# Patient Record
Sex: Male | Born: 1945
Health system: Southern US, Community
[De-identification: ages and names within clinical notes are randomized; demographics above are authoritative.]

## PROBLEM LIST (undated history)

## (undated) DIAGNOSIS — I10 Essential (primary) hypertension: Secondary | ICD-10-CM

## (undated) DIAGNOSIS — J302 Other seasonal allergic rhinitis: Secondary | ICD-10-CM

## (undated) DIAGNOSIS — I2510 Atherosclerotic heart disease of native coronary artery without angina pectoris: Secondary | ICD-10-CM

## (undated) DIAGNOSIS — K759 Inflammatory liver disease, unspecified: Secondary | ICD-10-CM

## (undated) DIAGNOSIS — E669 Obesity, unspecified: Secondary | ICD-10-CM

## (undated) DIAGNOSIS — Z9289 Personal history of other medical treatment: Secondary | ICD-10-CM

## (undated) DIAGNOSIS — R040 Epistaxis: Secondary | ICD-10-CM

## (undated) DIAGNOSIS — Z8669 Personal history of other diseases of the nervous system and sense organs: Secondary | ICD-10-CM

## (undated) DIAGNOSIS — G0490 Encephalitis and encephalomyelitis, unspecified: Secondary | ICD-10-CM

## (undated) DIAGNOSIS — Z950 Presence of cardiac pacemaker: Secondary | ICD-10-CM

## (undated) DIAGNOSIS — G4733 Obstructive sleep apnea (adult) (pediatric): Principal | ICD-10-CM

## (undated) DIAGNOSIS — I495 Sick sinus syndrome: Secondary | ICD-10-CM

## (undated) DIAGNOSIS — I639 Cerebral infarction, unspecified: Secondary | ICD-10-CM

## (undated) HISTORY — PX: APPENDECTOMY: SHX54

## (undated) HISTORY — PX: FRACTURE SURGERY: SHX138

## (undated) HISTORY — DX: Personal history of other medical treatment: Z92.89

## (undated) HISTORY — DX: Obesity, unspecified: E66.9

## (undated) HISTORY — DX: Sick sinus syndrome: I49.5

## (undated) HISTORY — DX: Cerebral infarction, unspecified: I63.9

## (undated) HISTORY — PX: CATARACT EXTRACTION W/ INTRAOCULAR LENS  IMPLANT, BILATERAL: SHX1307

## (undated) HISTORY — PX: ELBOW SURGERY: SHX618

## (undated) HISTORY — PX: WRIST FRACTURE SURGERY: SHX121

## (undated) HISTORY — DX: Essential (primary) hypertension: I10

## (undated) HISTORY — DX: Obstructive sleep apnea (adult) (pediatric): G47.33

## (undated) HISTORY — DX: Atherosclerotic heart disease of native coronary artery without angina pectoris: I25.10

## (undated) HISTORY — DX: Encephalitis and encephalomyelitis, unspecified: G04.90

---

## 1985-02-18 DIAGNOSIS — G0490 Encephalitis and encephalomyelitis, unspecified: Secondary | ICD-10-CM

## 1985-02-18 HISTORY — DX: Encephalitis and encephalomyelitis, unspecified: G04.90

## 2000-05-17 ENCOUNTER — Ambulatory Visit (HOSPITAL_COMMUNITY): Admission: RE | Admit: 2000-05-17 | Discharge: 2000-05-17 | Payer: Self-pay | Admitting: Family Medicine

## 2000-05-17 ENCOUNTER — Encounter: Payer: Self-pay | Admitting: Family Medicine

## 2010-08-20 HISTORY — PX: INGUINAL HERNIA REPAIR: SUR1180

## 2010-09-06 ENCOUNTER — Other Ambulatory Visit (HOSPITAL_COMMUNITY): Payer: BC Managed Care – PPO

## 2010-09-09 ENCOUNTER — Other Ambulatory Visit (INDEPENDENT_AMBULATORY_CARE_PROVIDER_SITE_OTHER): Payer: Self-pay | Admitting: Surgery

## 2010-09-09 ENCOUNTER — Ambulatory Visit (HOSPITAL_COMMUNITY)
Admission: RE | Admit: 2010-09-09 | Discharge: 2010-09-09 | Disposition: A | Discharge status: Home / Self Care | Payer: BC Managed Care – PPO | Source: Ambulatory Visit | Attending: Surgery | Admitting: Surgery

## 2010-09-09 ENCOUNTER — Encounter (HOSPITAL_COMMUNITY): Payer: BC Managed Care – PPO

## 2010-09-09 DIAGNOSIS — K4090 Unilateral inguinal hernia, without obstruction or gangrene, not specified as recurrent: Secondary | ICD-10-CM | POA: Insufficient documentation

## 2010-09-09 DIAGNOSIS — R9431 Abnormal electrocardiogram [ECG] [EKG]: Secondary | ICD-10-CM | POA: Insufficient documentation

## 2010-09-09 DIAGNOSIS — Z01812 Encounter for preprocedural laboratory examination: Secondary | ICD-10-CM | POA: Insufficient documentation

## 2010-09-09 DIAGNOSIS — Z01810 Encounter for preprocedural cardiovascular examination: Secondary | ICD-10-CM | POA: Insufficient documentation

## 2010-09-09 DIAGNOSIS — I10 Essential (primary) hypertension: Secondary | ICD-10-CM | POA: Insufficient documentation

## 2010-09-09 LAB — BASIC METABOLIC PANEL
BUN: 11 mg/dL (ref 6–23)
CO2: 28 mEq/L (ref 19–32)
Calcium: 8.8 mg/dL (ref 8.4–10.5)
Chloride: 95 mEq/L — ABNORMAL LOW (ref 96–112)
Creatinine, Ser: 0.64 mg/dL (ref 0.50–1.35)
GFR calc Af Amer: >60 mL/min (ref >60)
GFR calc non Af Amer: >60 mL/min (ref >60)
Glucose, Bld: 85 mg/dL (ref 70–99)
Potassium: 4.3 mEq/L (ref 3.5–5.1)
Sodium: 131 mEq/L — ABNORMAL LOW (ref 135–145)

## 2010-09-09 LAB — CBC
HCT: 40.5 % (ref 39.0–52.0)
Hemoglobin: 15 g/dL (ref 13.0–17.0)
MCH: 31.4 pg (ref 26.0–34.0)
MCHC: 37 g/dL — ABNORMAL HIGH (ref 30.0–36.0)
MCV: 84.9 fL (ref 78.0–100.0)
Platelets: 120 10*3/uL — ABNORMAL LOW (ref 150–400)
RBC: 4.77 MIL/uL (ref 4.22–5.81)
RDW: 11.7 % (ref 11.5–15.5)
WBC: 5.6 10*3/uL (ref 4.0–10.5)

## 2010-09-09 LAB — SURGICAL PCR SCREEN
MRSA, PCR: NEGATIVE
Staphylococcus aureus: POSITIVE — AB

## 2010-09-17 ENCOUNTER — Ambulatory Visit (HOSPITAL_COMMUNITY)
Admission: RE | Admit: 2010-09-17 | Discharge: 2010-09-17 | Disposition: A | Discharge status: Home / Self Care | Payer: BC Managed Care – PPO | Source: Ambulatory Visit | Attending: Surgery | Admitting: Surgery

## 2010-09-17 DIAGNOSIS — Z01810 Encounter for preprocedural cardiovascular examination: Secondary | ICD-10-CM | POA: Insufficient documentation

## 2010-09-17 DIAGNOSIS — K4090 Unilateral inguinal hernia, without obstruction or gangrene, not specified as recurrent: Secondary | ICD-10-CM

## 2010-09-17 DIAGNOSIS — Z01812 Encounter for preprocedural laboratory examination: Secondary | ICD-10-CM | POA: Insufficient documentation

## 2010-09-17 DIAGNOSIS — Z01818 Encounter for other preprocedural examination: Secondary | ICD-10-CM | POA: Insufficient documentation

## 2010-09-17 DIAGNOSIS — I498 Other specified cardiac arrhythmias: Secondary | ICD-10-CM | POA: Insufficient documentation

## 2010-09-17 DIAGNOSIS — I10 Essential (primary) hypertension: Secondary | ICD-10-CM | POA: Insufficient documentation

## 2010-09-21 NOTE — Op Note (Signed)
  Aaron Keller, Aaron Keller NO.:  [id number]  MEDICAL RECORD NO.:  [id number]  LOCATION:  DAYL                         FACILITY:  [facility]  PHYSICIAN:  Wilmon Arms. Corliss Skains, M.D. DATE OF BIRTH:  1945-05-04  DATE OF PROCEDURE: DATE OF DISCHARGE:                              OPERATIVE REPORT   PREOPERATIVE DIAGNOSIS:  Right inguinal hernia.  POSTOPERATIVE DIAGNOSIS:  Right inguinal hernia.  PROCEDURE:  Right inguinal hernia repair with mesh.  SURGEON:  Wilmon Arms. Satsuki Zillmer, MD  ANESTHESIA:  General.  INDICATIONS:  This is a 65 year old male who presents with a 81-month history of a palpable mass in his right groin.  This is reducible.  We diagnosed him with a right inguinal hernia, recommended repair.  DESCRIPTION OF PROCEDURE:  The patient was brought to the operating room, placed in the supine position on operating room table.  After an adequate level of general anesthesia was obtained, the patient's right groin was shaved, prepped with ChloraPrep and Betadine and draped in sterile fashion.  Time-out was taken to identify proper patient, proper procedure.  We made an oblique incision above the right inguinal ligament.  Dissection was carried down through considerable amount of adipose tissue to the fascia.  We had to use 2 Weitlaner retractors for exposure.  We saw a very large hernia bulge protruding from the external ring.  We opened the external oblique fascia along direction of its fibers down the external ring.  A small bleeding vessel was ligated with 3-0 silk sutures.  We reduced the large direct hernia sac back into the preperitoneal space.  We mobilized this thoroughly and then were able to close the floor of the inguinal canal with a 0 Vicryl.  We skeletonized the spermatic cord and reduced a small indirect hernia back through the internal ring.  The internal ring was tightened with a 3-0 Vicryl; 3 x 6 Ultrapro mesh was cut in a keyhole shape and then was  secured with 2-0 Prolene, beginning at the pubic tubercle.  We ran this along the shelving edge inferiorly and the internal oblique fascia superiorly. The tails of the mesh were sutured together behind the spermatic cord. The mesh was tucked underneath the external oblique fascia laterally. The fascia was reapproximated with 2-0 Vicryl.  We infiltrated the subcutaneous tissues and fascia with 20 mL of Exparel; 3-0 Vicryl was used to close subcutaneous tissues and 4-0 Monocryl was used to close the skin.  Steri-Strips and clean dressings were applied.  The patient was extubated, brought to recovery in stable condition.  All sponge, instrument, needle counts were correct.     Wilmon Arms. Corliss Skains, M.D.     MKT/MEDQ  D:  09/17/2010  T:  09/17/2010  Job:  [id number]  Electronically Signed by Manus Rudd M.D. on 09/21/2010 01:04:32 PM

## 2010-10-04 ENCOUNTER — Encounter (INDEPENDENT_AMBULATORY_CARE_PROVIDER_SITE_OTHER): Payer: Self-pay | Admitting: Surgery

## 2010-10-05 ENCOUNTER — Encounter (INDEPENDENT_AMBULATORY_CARE_PROVIDER_SITE_OTHER): Payer: Self-pay | Admitting: Surgery

## 2010-10-05 ENCOUNTER — Ambulatory Visit (INDEPENDENT_AMBULATORY_CARE_PROVIDER_SITE_OTHER): Payer: BC Managed Care – PPO | Admitting: Surgery

## 2010-10-05 DIAGNOSIS — Z8719 Personal history of other diseases of the digestive system: Secondary | ICD-10-CM | POA: Insufficient documentation

## 2010-10-05 DIAGNOSIS — Z9889 Other specified postprocedural states: Secondary | ICD-10-CM

## 2010-10-05 NOTE — Progress Notes (Signed)
This patient underwent a right inguinal hernia repair with mesh on June 29 for a large direct inguinal hernia. He had a very tiny indirect hernia as well. His only complaint today is some skin sensitivity where the hair is starting to grow back. Appetite and bowel movements are normal. He has been limiting his activity.  His incision seems to be healing well with no sign of infection. No scrotal or testicular swelling. He has a firm healing ridge under his incision as expected. He still has a little bit of numbness in the skin below his incision.  Impression: Status post right inguinal hernia repair with mesh for large direct inguinal hernia patient is doing as well as could be expected.  Plan: The patient may slowly begin increasing his level of activity over the next 2 weeks. He may resume full activity at that time. Followup on a p.r.n. basis.

## 2010-10-08 ENCOUNTER — Encounter (INDEPENDENT_AMBULATORY_CARE_PROVIDER_SITE_OTHER): Payer: BC Managed Care – PPO | Admitting: Surgery

## 2010-10-21 ENCOUNTER — Telehealth (INDEPENDENT_AMBULATORY_CARE_PROVIDER_SITE_OTHER): Payer: Self-pay

## 2010-10-21 ENCOUNTER — Encounter (INDEPENDENT_AMBULATORY_CARE_PROVIDER_SITE_OTHER): Payer: Self-pay | Admitting: Surgery

## 2010-10-21 NOTE — Telephone Encounter (Signed)
Pt's wife called to report that his incision has been bleeding/oozing.  No redness at incision site, but the pt did lift 2 50lb bags of salt earlier this week.  Pt has appt 8/3 with Dr. Corliss Skains.

## 2010-10-22 ENCOUNTER — Encounter (INDEPENDENT_AMBULATORY_CARE_PROVIDER_SITE_OTHER): Payer: Self-pay | Admitting: Surgery

## 2010-10-22 ENCOUNTER — Ambulatory Visit (INDEPENDENT_AMBULATORY_CARE_PROVIDER_SITE_OTHER): Payer: BC Managed Care – PPO | Admitting: Surgery

## 2010-10-22 VITALS — Temp 97.9°F

## 2010-10-22 DIAGNOSIS — Z9889 Other specified postprocedural states: Secondary | ICD-10-CM

## 2010-10-22 DIAGNOSIS — Z8719 Personal history of other diseases of the digestive system: Secondary | ICD-10-CM

## 2010-10-22 NOTE — Progress Notes (Signed)
The patient called yesterday with concerns about a little bit of drainage coming from the medial end of his incision. He is 5 weeks out from a right inguinal hernia repair with mesh. Last week he lifted a 40 pound bag of water softener salts. He noticed a little bit of drainage from the medial end of this incision and then some dark bloody looking drainage. It drainage since has stopped. He denies any pain in his groin. The swelling seems to have decreased.  On physical examination his incision is well healed with no openings. No drainage noted. No sign of recurrent hernia. The firm scar tissue seems to be less than last visit.  Impression: Status post right inguinal herniorrhaphy with mesh. No sign of wound infection or hematoma. Recommendations: He may resume full activity. Followup p.r.n.

## 2011-10-05 DIAGNOSIS — H26499 Other secondary cataract, unspecified eye: Secondary | ICD-10-CM | POA: Insufficient documentation

## 2013-09-18 DIAGNOSIS — Z961 Presence of intraocular lens: Secondary | ICD-10-CM | POA: Insufficient documentation

## 2016-03-17 ENCOUNTER — Telehealth: Payer: Self-pay

## 2016-03-17 NOTE — Telephone Encounter (Signed)
SENT NOTES TO SCHEDULING 

## 2016-04-01 ENCOUNTER — Encounter (HOSPITAL_COMMUNITY): Payer: Self-pay

## 2016-04-01 ENCOUNTER — Emergency Department (HOSPITAL_COMMUNITY): Payer: BLUE CROSS/BLUE SHIELD

## 2016-04-01 ENCOUNTER — Encounter (HOSPITAL_COMMUNITY)
Admission: EM | Disposition: A | Discharge status: Home / Self Care | Payer: Self-pay | Source: Home / Self Care | Attending: Cardiovascular Disease

## 2016-04-01 ENCOUNTER — Inpatient Hospital Stay (HOSPITAL_COMMUNITY)
Admission: EM | Admit: 2016-04-01 | Discharge: 2016-04-04 | DRG: 287 | Disposition: A | Discharge status: Home / Self Care | Payer: BLUE CROSS/BLUE SHIELD | Attending: Cardiovascular Disease | Admitting: Cardiovascular Disease

## 2016-04-01 DIAGNOSIS — Z79899 Other long term (current) drug therapy: Secondary | ICD-10-CM | POA: Diagnosis not present

## 2016-04-01 DIAGNOSIS — N183 Chronic kidney disease, stage 3 (moderate): Secondary | ICD-10-CM | POA: Diagnosis present

## 2016-04-01 DIAGNOSIS — I442 Atrioventricular block, complete: Secondary | ICD-10-CM | POA: Diagnosis not present

## 2016-04-01 DIAGNOSIS — R001 Bradycardia, unspecified: Secondary | ICD-10-CM | POA: Diagnosis present

## 2016-04-01 DIAGNOSIS — Z8669 Personal history of other diseases of the nervous system and sense organs: Secondary | ICD-10-CM | POA: Diagnosis not present

## 2016-04-01 DIAGNOSIS — I10 Essential (primary) hypertension: Secondary | ICD-10-CM | POA: Diagnosis not present

## 2016-04-01 DIAGNOSIS — I25110 Atherosclerotic heart disease of native coronary artery with unstable angina pectoris: Secondary | ICD-10-CM | POA: Diagnosis present

## 2016-04-01 DIAGNOSIS — Z8673 Personal history of transient ischemic attack (TIA), and cerebral infarction without residual deficits: Secondary | ICD-10-CM

## 2016-04-01 DIAGNOSIS — Z8661 Personal history of infections of the central nervous system: Secondary | ICD-10-CM

## 2016-04-01 DIAGNOSIS — Z888 Allergy status to other drugs, medicaments and biological substances status: Secondary | ICD-10-CM

## 2016-04-01 DIAGNOSIS — I2510 Atherosclerotic heart disease of native coronary artery without angina pectoris: Secondary | ICD-10-CM | POA: Diagnosis not present

## 2016-04-01 DIAGNOSIS — I25700 Atherosclerosis of coronary artery bypass graft(s), unspecified, with unstable angina pectoris: Secondary | ICD-10-CM | POA: Diagnosis not present

## 2016-04-01 DIAGNOSIS — I129 Hypertensive chronic kidney disease with stage 1 through stage 4 chronic kidney disease, or unspecified chronic kidney disease: Secondary | ICD-10-CM | POA: Diagnosis present

## 2016-04-01 DIAGNOSIS — I200 Unstable angina: Secondary | ICD-10-CM | POA: Diagnosis present

## 2016-04-01 DIAGNOSIS — I25119 Atherosclerotic heart disease of native coronary artery with unspecified angina pectoris: Secondary | ICD-10-CM | POA: Diagnosis present

## 2016-04-01 HISTORY — PX: CARDIAC CATHETERIZATION: SHX172

## 2016-04-01 HISTORY — DX: Personal history of other diseases of the nervous system and sense organs: Z86.69

## 2016-04-01 HISTORY — DX: Essential (primary) hypertension: I10

## 2016-04-01 LAB — CBC WITH DIFFERENTIAL/PLATELET
BASOS ABS: 0 10*3/uL (ref 0.0–0.1)
BASOS PCT: 0 %
EOS PCT: 1 %
Eosinophils Absolute: 0.1 10*3/uL (ref 0.0–0.7)
HCT: 39.8 % (ref 39.0–52.0)
Hemoglobin: 14.1 g/dL (ref 13.0–17.0)
LYMPHS ABS: 1.9 10*3/uL (ref 0.7–4.0)
Lymphocytes Relative: 19 %
MCH: 30.9 pg (ref 26.0–34.0)
MCHC: 35.4 g/dL (ref 30.0–36.0)
MCV: 87.3 fL (ref 78.0–100.0)
Monocytes Absolute: 0.5 10*3/uL (ref 0.1–1.0)
Monocytes Relative: 5 %
NEUTROS PCT: 75 %
Neutro Abs: 7.3 10*3/uL (ref 1.7–7.7)
PLATELETS: 144 10*3/uL — AB (ref 150–400)
RBC: 4.56 MIL/uL (ref 4.22–5.81)
RDW: 12.2 % (ref 11.5–15.5)
WBC: 9.8 10*3/uL (ref 4.0–10.5)

## 2016-04-01 LAB — I-STAT CHEM 8, ED
BUN: 21 mg/dL — ABNORMAL HIGH (ref 6–20)
CREATININE: 1.4 mg/dL — AB (ref 0.61–1.24)
Calcium, Ion: 1.02 mmol/L — ABNORMAL LOW (ref 1.15–1.40)
Chloride: 95 mmol/L — ABNORMAL LOW (ref 101–111)
Glucose, Bld: 137 mg/dL — ABNORMAL HIGH (ref 65–99)
HEMATOCRIT: 40 % (ref 39.0–52.0)
HEMOGLOBIN: 13.6 g/dL (ref 13.0–17.0)
POTASSIUM: 4.7 mmol/L (ref 3.5–5.1)
SODIUM: 130 mmol/L — AB (ref 135–145)
TCO2: 25 mmol/L (ref 0–100)

## 2016-04-01 LAB — TROPONIN I
Troponin I: 0.03 ng/mL (ref <0.03)
Troponin I: 0.04 ng/mL (ref <0.03)

## 2016-04-01 LAB — COMPREHENSIVE METABOLIC PANEL
ALBUMIN: 4 g/dL (ref 3.5–5.0)
ALK PHOS: 33 U/L — AB (ref 38–126)
ALT: 18 U/L (ref 17–63)
AST: 25 U/L (ref 15–41)
Anion gap: 13 (ref 5–15)
BUN: 17 mg/dL (ref 6–20)
CHLORIDE: 96 mmol/L — AB (ref 101–111)
CO2: 22 mmol/L (ref 22–32)
Calcium: 8.6 mg/dL — ABNORMAL LOW (ref 8.9–10.3)
Creatinine, Ser: 1.47 mg/dL — ABNORMAL HIGH (ref 0.61–1.24)
GFR calc Af Amer: 54 mL/min — ABNORMAL LOW (ref >60)
GFR calc non Af Amer: 47 mL/min — ABNORMAL LOW (ref >60)
GLUCOSE: 140 mg/dL — AB (ref 65–99)
Potassium: 4.8 mmol/L (ref 3.5–5.1)
SODIUM: 131 mmol/L — AB (ref 135–145)
Total Bilirubin: 0.7 mg/dL (ref 0.3–1.2)
Total Protein: 6.5 g/dL (ref 6.5–8.1)

## 2016-04-01 LAB — I-STAT CG4 LACTIC ACID, ED: Lactic Acid, Venous: 2.17 mmol/L (ref 0.5–1.9)

## 2016-04-01 LAB — I-STAT TROPONIN, ED: Troponin i, poc: 0.01 ng/mL (ref 0.00–0.08)

## 2016-04-01 LAB — PROTIME-INR
INR: 0.99
Prothrombin Time: 13.1 seconds (ref 11.4–15.2)

## 2016-04-01 LAB — MAGNESIUM
Magnesium: 2.2 mg/dL (ref 1.7–2.4)
Magnesium: 2.1 mg/dL (ref 1.7–2.4)

## 2016-04-01 LAB — MRSA PCR SCREENING: MRSA by PCR: NEGATIVE

## 2016-04-01 LAB — TSH: TSH: 3.644 u[IU]/mL (ref 0.350–4.500)

## 2016-04-01 SURGERY — LEFT HEART CATH AND CORONARY ANGIOGRAPHY
Anesthesia: LOCAL

## 2016-04-01 MED ORDER — SODIUM CHLORIDE 0.9 % IV SOLN: 250.0000 mL | INTRAVENOUS | Status: DC | PRN

## 2016-04-01 MED ORDER — ONDANSETRON HCL 4 MG/2ML IJ SOLN: 4.0000 mg | Freq: Four times a day (QID) | INTRAMUSCULAR | Status: DC | PRN

## 2016-04-01 MED ORDER — ASPIRIN 81 MG PO CHEW
81.0000 mg | CHEWABLE_TABLET | ORAL | Status: DC
Start: 2016-04-02 — End: 2016-04-03

## 2016-04-01 MED ORDER — SIMVASTATIN 20 MG PO TABS: 20.0000 mg | ORAL_TABLET | Freq: Every day | ORAL | Status: DC

## 2016-04-01 MED ORDER — VERAPAMIL HCL 2.5 MG/ML IV SOLN
INTRAVENOUS | Status: AC
  Filled 2016-04-01: qty 2

## 2016-04-01 MED ORDER — SODIUM CHLORIDE 0.9% FLUSH
3.0000 mL | Freq: Two times a day (BID) | INTRAVENOUS | Status: DC
  Administered 2016-04-01 – 2016-04-04 (×5): 3 mL via INTRAVENOUS

## 2016-04-01 MED ORDER — CARBAMAZEPINE 200 MG PO TABS
200.0000 mg | ORAL_TABLET | Freq: Three times a day (TID) | ORAL | Status: DC
  Administered 2016-04-01 – 2016-04-04 (×10): 200 mg via ORAL
  Filled 2016-04-01 (×10): qty 1

## 2016-04-01 MED ORDER — HEPARIN SODIUM (PORCINE) 5000 UNIT/ML IJ SOLN
5000.0000 [IU] | Freq: Three times a day (TID) | INTRAMUSCULAR | Status: DC
  Administered 2016-04-01 – 2016-04-03 (×6): 5000 [IU] via SUBCUTANEOUS
  Filled 2016-04-01 (×6): qty 1

## 2016-04-01 MED ORDER — ASPIRIN 300 MG RE SUPP: 300.0000 mg | RECTAL | Status: DC

## 2016-04-01 MED ORDER — ASPIRIN-DIPYRIDAMOLE ER 25-200 MG PO CP12
1.0000 | ORAL_CAPSULE | Freq: Every day | ORAL | Status: DC
  Administered 2016-04-02 – 2016-04-04 (×3): 1 via ORAL
  Filled 2016-04-01 (×3): qty 1

## 2016-04-01 MED ORDER — AMLODIPINE BESYLATE 10 MG PO TABS
10.0000 mg | ORAL_TABLET | Freq: Every day | ORAL | Status: DC
  Administered 2016-04-02 – 2016-04-04 (×3): 10 mg via ORAL
  Filled 2016-04-01 (×3): qty 1

## 2016-04-01 MED ORDER — LIDOCAINE HCL (PF) 1 % IJ SOLN
INTRAMUSCULAR | Status: DC | PRN
  Administered 2016-04-01: 8 mL
  Administered 2016-04-01: 2 mL

## 2016-04-01 MED ORDER — IOPAMIDOL (ISOVUE-370) INJECTION 76%
INTRAVENOUS | Status: DC | PRN
  Administered 2016-04-01: 45 mL via INTRA_ARTERIAL

## 2016-04-01 MED ORDER — ATORVASTATIN CALCIUM 80 MG PO TABS
80.0000 mg | ORAL_TABLET | Freq: Every day | ORAL | Status: DC
  Administered 2016-04-01 – 2016-04-03 (×3): 80 mg via ORAL
  Filled 2016-04-01 (×3): qty 1

## 2016-04-01 MED ORDER — NITROGLYCERIN 0.4 MG SL SUBL: 0.4000 mg | SUBLINGUAL_TABLET | SUBLINGUAL | Status: DC | PRN

## 2016-04-01 MED ORDER — CITALOPRAM HYDROBROMIDE 20 MG PO TABS
40.0000 mg | ORAL_TABLET | Freq: Every day | ORAL | Status: DC
  Administered 2016-04-02 – 2016-04-04 (×3): 40 mg via ORAL
  Filled 2016-04-01 (×3): qty 2

## 2016-04-01 MED ORDER — SODIUM CHLORIDE 0.9 % IV BOLUS (SEPSIS)
1000.0000 mL | Freq: Once | INTRAVENOUS | Status: AC
  Administered 2016-04-01: 1000 mL via INTRAVENOUS

## 2016-04-01 MED ORDER — ASPIRIN 81 MG PO CHEW
324.0000 mg | CHEWABLE_TABLET | ORAL | Status: DC
  Filled 2016-04-01: qty 4

## 2016-04-01 MED ORDER — HEPARIN SODIUM (PORCINE) 1000 UNIT/ML IJ SOLN
INTRAMUSCULAR | Status: AC
  Filled 2016-04-01: qty 1

## 2016-04-01 MED ORDER — GABAPENTIN 800 MG PO TABS
400.0000 mg | ORAL_TABLET | Freq: Four times a day (QID) | ORAL | Status: DC
  Filled 2016-04-01: qty 0.5

## 2016-04-01 MED ORDER — LIDOCAINE HCL (PF) 1 % IJ SOLN
INTRAMUSCULAR | Status: AC
  Filled 2016-04-01: qty 30

## 2016-04-01 MED ORDER — SODIUM CHLORIDE 0.9 % IV SOLN: INTRAVENOUS | Status: DC

## 2016-04-01 MED ORDER — FENTANYL CITRATE (PF) 100 MCG/2ML IJ SOLN: 200.0000 ug | Freq: Once | INTRAMUSCULAR | Status: DC

## 2016-04-01 MED ORDER — SODIUM CHLORIDE 0.9 % WEIGHT BASED INFUSION: 1.0000 mL/kg/h | INTRAVENOUS | Status: AC

## 2016-04-01 MED ORDER — CLORAZEPATE DIPOTASSIUM 3.75 MG PO TABS
3.7500 mg | ORAL_TABLET | Freq: Two times a day (BID) | ORAL | Status: DC
  Administered 2016-04-01 – 2016-04-04 (×6): 3.75 mg via ORAL
  Filled 2016-04-01 (×6): qty 1

## 2016-04-01 MED ORDER — VERAPAMIL HCL 2.5 MG/ML IV SOLN
INTRAVENOUS | Status: DC | PRN
  Administered 2016-04-01: 10 mL via INTRA_ARTERIAL

## 2016-04-01 MED ORDER — SODIUM CHLORIDE 0.9 % IV SOLN
250.0000 mL | INTRAVENOUS | Status: DC | PRN
  Administered 2016-04-02: 250 mL via INTRAVENOUS

## 2016-04-01 MED ORDER — HEPARIN (PORCINE) IN NACL 2-0.9 UNIT/ML-% IJ SOLN
INTRAMUSCULAR | Status: AC
  Filled 2016-04-01: qty 1000

## 2016-04-01 MED ORDER — GABAPENTIN 400 MG PO CAPS
400.0000 mg | ORAL_CAPSULE | Freq: Four times a day (QID) | ORAL | Status: DC
  Administered 2016-04-01 – 2016-04-04 (×12): 400 mg via ORAL
  Filled 2016-04-01 (×13): qty 1

## 2016-04-01 MED ORDER — HEPARIN SODIUM (PORCINE) 1000 UNIT/ML IJ SOLN
INTRAMUSCULAR | Status: DC | PRN
  Administered 2016-04-01: 4500 [IU] via INTRAVENOUS

## 2016-04-01 MED ORDER — ALPRAZOLAM 0.25 MG PO TABS: 0.2500 mg | ORAL_TABLET | Freq: Two times a day (BID) | ORAL | Status: DC | PRN

## 2016-04-01 MED ORDER — IOPAMIDOL (ISOVUE-370) INJECTION 76%
INTRAVENOUS | Status: AC
  Filled 2016-04-01: qty 125

## 2016-04-01 MED ORDER — ATROPINE SULFATE 1 MG/10ML IJ SOSY
1.0000 mg | PREFILLED_SYRINGE | Freq: Once | INTRAMUSCULAR | Status: AC
  Administered 2016-04-01: 1 mg via INTRAVENOUS

## 2016-04-01 MED ORDER — METOPROLOL TARTRATE 25 MG PO TABS: 25.0000 mg | ORAL_TABLET | Freq: Two times a day (BID) | ORAL | Status: DC

## 2016-04-01 MED ORDER — HEPARIN (PORCINE) IN NACL 2-0.9 UNIT/ML-% IJ SOLN
INTRAMUSCULAR | Status: DC | PRN
  Administered 2016-04-01: 1000 mL

## 2016-04-01 MED ORDER — ACETAMINOPHEN 325 MG PO TABS: 650.0000 mg | ORAL_TABLET | ORAL | Status: DC | PRN

## 2016-04-01 MED ORDER — SODIUM CHLORIDE 0.9% FLUSH: 3.0000 mL | INTRAVENOUS | Status: DC | PRN

## 2016-04-01 MED ORDER — FOLIC ACID 1 MG PO TABS
1.0000 mg | ORAL_TABLET | Freq: Every day | ORAL | Status: DC
  Administered 2016-04-02 – 2016-04-04 (×3): 1 mg via ORAL
  Filled 2016-04-01 (×3): qty 1

## 2016-04-01 MED ORDER — ZOLPIDEM TARTRATE 5 MG PO TABS
5.0000 mg | ORAL_TABLET | Freq: Every evening | ORAL | Status: DC | PRN
  Filled 2016-04-01: qty 1

## 2016-04-01 SURGICAL SUPPLY — 16 items
CABLE ADAPT CONN TEMP 6FT (ADAPTER) ×2 IMPLANT
CATH EXPO 5F FL3.5 (CATHETERS) ×2 IMPLANT
CATH EXPO 5FR ANG PIGTAIL 145 (CATHETERS) ×2 IMPLANT
CATH INFINITI JR4 5F (CATHETERS) ×2 IMPLANT
CATH S G BIP PACING (SET/KITS/TRAYS/PACK) ×2 IMPLANT
DEVICE RAD COMP TR BAND LRG (VASCULAR PRODUCTS) ×2 IMPLANT
GLIDESHEATH SLEND SS 6F .021 (SHEATH) ×2 IMPLANT
GUIDEWIRE INQWIRE 1.5J.035X260 (WIRE) ×1 IMPLANT
INQWIRE 1.5J .035X260CM (WIRE) ×2
KIT HEART LEFT (KITS) ×2 IMPLANT
PACK CARDIAC CATHETERIZATION (CUSTOM PROCEDURE TRAY) ×2 IMPLANT
SHEATH PINNACLE 6F 10CM (SHEATH) ×2 IMPLANT
SLEEVE REPOSITIONING LENGTH 30 (MISCELLANEOUS) ×2 IMPLANT
SYR MEDRAD MARK V 150ML (SYRINGE) ×2 IMPLANT
TRANSDUCER W/STOPCOCK (MISCELLANEOUS) ×2 IMPLANT
TUBING CIL FLEX 10 FLL-RA (TUBING) ×2 IMPLANT

## 2016-04-01 NOTE — ED Notes (Signed)
Patient states he didn't really feel very good yest. C/o chest discomfort and sob. States he was afraid to go to sleep last pm. C/o nausea with ambulation. Skin w/d color pink . Wife at bedsid.e

## 2016-04-01 NOTE — ED Notes (Signed)
Dr. Regenia Skeeter at bedside patient still states he isn't having chest pain patient is alert oriented.

## 2016-04-01 NOTE — ED Notes (Signed)
Permit signed for cath lab.

## 2016-04-01 NOTE — ED Provider Notes (Signed)
[name] Provider Note   CSN: [id number] Arrival date & time: 04/01/16  [id number]     History   Chief Complaint Chief Complaint  Patient presents with  . Bradycardia    HPI Aaron Keller is a 71 y.o. male  past medical history of hypertension presenting today with chest pressure and dizziness and lightheadedness. Patient states he woke up with the symptoms. He also has shortness of breath. He denies having this severe of symptoms in the past. He has had bradycardia and has been documenting his vital signs recently. He has follow-up with cardiology to see Dr. Saunders Revel in 2 weeks. Upon arrival to the emergency department, his heart rate was found to be in the 30s and he was immediately taken back to acute room.  10 Systems reviewed and are negative for acute change except as noted in the HPI.   HPI  Past Medical History:  Diagnosis Date  . Allergy   . Chronic leg pain   . Encephalitis   . Hypertension   . Seizures ([place])   . Stroke [facility])     Patient Active Problem List   Diagnosis Date Noted  . S/P right inguinal hernia repair 10/05/2010    Past Surgical History:  Procedure Laterality Date  . APPENDECTOMY    . HERNIA REPAIR         Home Medications    Prior to Admission medications   Medication Sig Start Date End Date Taking? Authorizing Provider  carbamazepine (TEGRETOL) 200 MG tablet Take 200 mg by mouth 3 (three) times daily.      Historical Provider, MD  citalopram (CELEXA) 40 MG tablet Take 40 mg by mouth daily.      Historical Provider, MD  clorazepate (TRANXENE-T) 3.75 MG tablet Take 3.75 mg by mouth 2 (two) times daily.      Historical Provider, MD  folic acid (FOLVITE) 1 MG tablet Take 1 mg by mouth daily.      Historical Provider, MD  gabapentin (NEURONTIN) 400 MG tablet Take 400 mg by mouth 4 (four) times daily.      Historical Provider, MD  metoprolol tartrate (LOPRESSOR) 25 MG tablet Take 25 mg by mouth 2 (two) times daily.      Historical  Provider, MD  simvastatin (ZOCOR) 20 MG tablet Take 20 mg by mouth at bedtime.      Historical Provider, MD    Family History History reviewed. No pertinent family history.  Social History Social History  Substance Use Topics  . Smoking status: Never Smoker  . Smokeless tobacco: Never Used  . Alcohol use No     Allergies   Cyclosporine and Serzone [nefazodone hcl]   Review of Systems Review of Systems   Physical Exam Updated Vital Signs BP 110/57 (BP Location: Left Arm)   Pulse (!) 56   Temp 98.6 F (37 C) (Oral)   Resp 18   Ht 5\' 5"  (1.651 m)   Wt 188 lb (85.3 kg)   SpO2 100%   BMI 31.28 kg/m   Physical Exam  Constitutional: He is oriented to person, place, and time. Vital signs are normal. He appears well-developed and well-nourished.  Non-toxic appearance. He does not appear ill. He appears distressed.  HENT:  Head: Normocephalic and atraumatic.  Nose: Nose normal.  Mouth/Throat: Oropharynx is clear and moist. No oropharyngeal exudate.  Eyes: Conjunctivae and EOM are normal. Pupils are equal, round, and reactive to light. No scleral icterus.  Neck: Normal range of motion. Neck  supple. No tracheal deviation, no edema, no erythema and normal range of motion present. No thyroid mass and no thyromegaly present.  Cardiovascular: Regular rhythm, S1 normal, S2 normal, normal heart sounds, intact distal pulses and normal pulses.  Exam reveals no gallop and no friction rub.   No murmur heard. Bradycardic  Pulmonary/Chest: Effort normal and breath sounds normal. No respiratory distress. He has no wheezes. He has no rhonchi. He has no rales.  Abdominal: Soft. Normal appearance and bowel sounds are normal. He exhibits no distension, no ascites and no mass. There is no hepatosplenomegaly. There is no tenderness. There is no rebound, no guarding and no CVA tenderness.  Musculoskeletal: Normal range of motion. He exhibits no edema or tenderness.  Lymphadenopathy:    He has no  cervical adenopathy.  Neurological: He is alert and oriented to person, place, and time. He has normal strength. No cranial nerve deficit or sensory deficit.  Skin: Skin is warm, dry and intact. No petechiae and no rash noted. He is not diaphoretic. No erythema. No pallor.  Nursing note and vitals reviewed.    ED Treatments / Results  Labs (all labs ordered are listed, but only abnormal results are displayed) Labs Reviewed  CBC WITH DIFFERENTIAL/PLATELET - Abnormal; Notable for the following:       Result Value   Platelets 144 (*)    All other components within normal limits  I-STAT CHEM 8, ED - Abnormal; Notable for the following:    Sodium 130 (*)    Chloride 95 (*)    BUN 21 (*)    Creatinine, Ser 1.40 (*)    Glucose, Bld 137 (*)    Calcium, Ion 1.02 (*)    All other components within normal limits  I-STAT CG4 LACTIC ACID, ED - Abnormal; Notable for the following:    Lactic Acid, Venous 2.17 (*)    All other components within normal limits  PROTIME-INR  COMPREHENSIVE METABOLIC PANEL  MAGNESIUM  I-STAT TROPOININ, ED  I-STAT TROPOININ, ED    EKG  EKG Interpretation  Date/Time:  Friday April 01 2016 06:27:55 EST Ventricular Rate:  45 PR Interval:    QRS Duration: 110 QT Interval:  452 QTC Calculation: 390 R Axis:   -40 Text Interpretation:  Normal sinus rhythm in a pattern of bigeminy Since last tracing rate slower Confirmed by Glynn Octave [phone]) on 04/01/2016 6:58:48 AM       Radiology No results found.  Procedures Procedures (including critical care time)  Medications Ordered in ED Medications  fentaNYL (SUBLIMAZE) injection 200 mcg (not administered)  sodium chloride 0.9 % bolus 1,000 mL (1,000 mLs Intravenous New Bag/Given 04/01/16 0703)  atropine 1 MG/10ML injection 1 mg (1 mg Intravenous Given 04/01/16 0644)     Initial Impression / Assessment and Plan / ED Course  I have reviewed the triage vital signs and the nursing notes.  Pertinent  labs & imaging results that were available during my care of the patient were reviewed by me and considered in my medical decision making (see chart for details).  Clinical Course     Patient presents to the emergency department for chest pressure and dizziness. He was severely bradycardic to the 30s. It appears to be a bigeminy rhythm. He was given calcium and bicarbonate as well as 1 L IV fluids. Upon repeat evaluation his heart rate is now consistently in the 60s. He states his symptoms however have not significantly improved. Repeat EKG shows the same rhythm only faster.  Cardiology has been consult it for further care. Troponin is negative, creatinine is 1.4 without any baseline here.  We'll continue to closely monitor.   CRITICAL CARE Performed by: Everlene Balls   Total critical care time: 40 minutes - symptomatic bradycardia  Critical care time was exclusive of separately billable procedures and treating other patients.  Critical care was necessary to treat or prevent imminent or life-threatening deterioration.  Critical care was time spent personally by me on the following activities: development of treatment plan with patient and/or surrogate as well as nursing, discussions with consultants, evaluation of patient's response to treatment, examination of patient, obtaining history from patient or surrogate, ordering and performing treatments and interventions, ordering and review of laboratory studies, ordering and review of radiographic studies, pulse oximetry and re-evaluation of patient's condition.   Final Clinical Impressions(s) / ED Diagnoses   Final diagnoses:  None    New Prescriptions New Prescriptions   No medications on file     Everlene Balls, MD 04/01/16 0800

## 2016-04-01 NOTE — ED Triage Notes (Signed)
Pt here for cp and sob, dizzy hr found in 30's

## 2016-04-01 NOTE — Consult Note (Signed)
ELECTROPHYSIOLOGY CONSULT NOTE    Patient ID: Aaron Keller MRN: [id number], DOB/AGE: May 04, 1945 71 y.o.  Admit date: 04/01/2016 Date of Consult: 04/01/2016   Primary Physician: Gara Kroner, MD Primary Cardiologist: New to [facility] Requesting MD: Dr. Angelena Form  Reason for Consultation: profound bradycardia  HPI: Aaron Keller is a 71 y.o. male with PMHx of HTN, prior CVA in 2002, encephalitis with increasing fatigue, weakness and SOB more acutely in the last couple days though generally poor energy for about 6 months. He was noted in the ER to have profound bradycardia with rates in 30's, cardiology was called and emergently temp pacing wire placed.  The patient tells me he has been very weak, and on a couple occassions though he may faint but did not, he developed steadily worsening weakness with some chest heaviness and came in.  He has not fainted, no palpitations.  LABS K+ 4.7 BUN/Creat 1.40 poc Trop 0.01 H/H 14/39 WBC 9.8 plts 144  Home meds reviewed include lopressor 25mg  BID, he took his morning dose this morning at 0530   Past Medical History:  Diagnosis Date  . Allergy   . Chronic leg pain   . Encephalitis   . Hypertension   . Seizures ([place])   . Stroke [facility])      Surgical History:  Past Surgical History:  Procedure Laterality Date  . APPENDECTOMY    . HERNIA REPAIR       Prescriptions Prior to Admission  Medication Sig Dispense Refill Last Dose  . amLODipine (NORVASC) 10 MG tablet Take 10 mg by mouth daily.   04/01/2016 at Unknown time  . carbamazepine (TEGRETOL) 200 MG tablet Take 200 mg by mouth 4 (four) times daily.    04/01/2016 at AM  . citalopram (CELEXA) 40 MG tablet Take 40 mg by mouth daily.     04/01/2016 at Unknown time  . clorazepate (TRANXENE-T) 3.75 MG tablet Take 3.75 mg by mouth 2 (two) times daily.     04/01/2016 at AM  . dipyridamole-aspirin (AGGRENOX) 200-25 MG 12hr capsule Take 1 capsule by mouth 2 (two) times daily.   123456 at  AM  . folic acid (FOLVITE) 1 MG tablet Take 1 mg by mouth daily.     04/01/2016 at Unknown time  . gabapentin (NEURONTIN) 400 MG tablet Take 400 mg by mouth 4 (four) times daily.     04/01/2016 at AM  . losartan (COZAAR) 100 MG tablet Take 100 mg by mouth daily.   04/01/2016 at Unknown time  . metoprolol tartrate (LOPRESSOR) 25 MG tablet Take 25 mg by mouth 2 (two) times daily.     04/01/2016 at 0500    Inpatient Medications:  . [START ON 04/02/2016] amLODipine  10 mg Oral Daily  . [START ON 04/02/2016] aspirin  81 mg Oral Pre-Cath  . carbamazepine  200 mg Oral TID  . [START ON 04/02/2016] citalopram  40 mg Oral Daily  . clorazepate  3.75 mg Oral BID  . [START ON 04/02/2016] dipyridamole-aspirin  1 capsule Oral Daily  . [START ON 123XX123 folic acid  1 mg Oral Daily  . gabapentin  400 mg Oral Q6H  . heparin  5,000 Units Subcutaneous Q8H  . sodium chloride flush  3 mL Intravenous Q12H    Allergies:  Allergies  Allergen Reactions  . Cyclosporine Other (See Comments)    Unknown  . Serzone [Nefazodone Hcl] Other (See Comments)    Unknown    Social History   Social History  .  Marital status: Married    Spouse name: N/A  . Number of children: N/A  . Years of education: N/A   Occupational History  . Not on file.   Social History Main Topics  . Smoking status: Never Smoker  . Smokeless tobacco: Never Used  . Alcohol use No  . Drug use: Unknown  . Sexual activity: Not on file   Other Topics Concern  . Not on file   Social History Narrative  . No narrative on file     Family History  Problem Relation Age of Onset  . Heart attack Mother     in her 72's  . Cancer Father     liver     Review of Systems: All other systems reviewed and are otherwise negative except as noted above.  Physical Exam: Vitals:   04/01/16 0945 04/01/16 1030 04/01/16 1045 04/01/16 1100  BP: 116/76 (!) 141/80 135/78 (!) 146/77  Pulse: 80     Resp: 11 20 11 16   Temp:      TempSrc:      SpO2:  99%     Weight:      Height:  5\' 5"  (1.651 m)      GEN- The patient is well appearing, alert and oriented x 3 today.   HEENT: normocephalic, atraumatic; sclera clear, conjunctiva pink; hearing intact; oropharynx clear; neck supple, no JVP Lymph- no cervical lymphadenopathy Lungs- Clear to ausculation bilaterally, normal work of breathing.  No wheezes, rales, rhonchi Heart- Regular rate and rhythm, no murmurs, rubs or gallops, PMI not laterally displaced GI- soft, non-tender, non-distended Extremities- no clubbing, cyanosis, or edema MS- no significant deformity or atrophy Skin- warm and dry, no rash or lesion Psych- euthymic mood, full affect Neuro- no gross deficits observed  Labs:   Lab Results  Component Value Date   WBC 9.8 04/01/2016   HGB 13.6 04/01/2016   HCT 40.0 04/01/2016   MCV 87.3 04/01/2016   PLT 144 (L) 04/01/2016     Recent Labs Lab 04/01/16 0643 04/01/16 0650  NA 131* 130*  K 4.8 4.7  CL 96* 95*  CO2 22  --   BUN 17 21*  CREATININE 1.47* 1.40*  CALCIUM 8.6*  --   PROT 6.5  --   BILITOT 0.7  --   ALKPHOS 33*  --   ALT 18  --   AST 25  --   GLUCOSE 140* 137*      Radiology/Studies:  Dg Chest Port 1 View Result Date: 04/01/2016 CLINICAL DATA:  Bradycardia.  Chest pain EXAM: PORTABLE CHEST 1 VIEW COMPARISON:  September 09, 2010 FINDINGS: There is no edema or consolidation. Heart is upper normal in size with pulmonary vascularity within normal limits. No adenopathy. No bone lesions. No pneumothorax. IMPRESSION: No edema or consolidation. Electronically Signed   By: Lowella Grip III M.D.   On: 04/01/2016 07:39    EKG: junctional bradycardia, 30's  TELEMETRY:  V paced currently  Today, LHC w/Dr. Angelena Form 1. Coronary artery disease    - Mild ectasia in the proximal LAD. The distal LAD is diffusely diseased with a focal area of 90% stenosis    - Dominant LCx with diffuse severe ectasia. Diffuse 50% PDA    - 60% mid RCA 2. Normal LV function 3.  Mildly elevated LVEDP. Plan: recommend treating CAD medically. I suspect his current symptoms are related to his bradycardia. There is not a lesion that would explain his bradycardia. Will leave temporary pacemaker in place.  Hold beta blocker. If HR doses not improve will need to consider for permanent pacemaker.    Assessment and Plan:   1. Junctional bradycardia     Symptomatic, no syncope     S/p emergent cath/temp pacing wire insertion     Last dose of lopressor was this morning @ 0530  BB has been held, will need to allow washout, 5 1/2 lives prior to consideration of PPM.   2. HTN     stable  Signed, Tommye Standard, PA-C 04/01/2016 11:29 AM   I have seen and examined this patient with Tommye Standard.  Agree with above, note added to reflect my findings.  On exam, regular rhythm, no murmurs, lungs clear. Presented to the hospital with complete AV block. Had cath as he has been having chest pain. Cath with nonobstructive CAD. Temporary wire in place. Took metoprolol this AM and thus needs it to wash out prior to pacemaker placement.  Would wait 30 hours as metoprolol has a ~6 hour half life.    Will M. Camnitz MD 04/01/2016 2:56 PM

## 2016-04-01 NOTE — Interval H&P Note (Signed)
History and Physical Interval Note:  04/01/2016 9:07 AM  [name]  has presented today for surgery, with the diagnosis of cp  The various methods of treatment have been discussed with the patient and family. After consideration of risks, benefits and other options for treatment, the patient has consented to  Procedure(s): Left Heart Cath and Coronary Angiography (N/A) Temporary Pacemaker (N/A) as a surgical intervention .  The patient's history has been reviewed, patient examined, no change in status, stable for surgery.  I have reviewed the patient's chart and labs.  Questions were answered to the patient's satisfaction.   Cath Lab Visit (complete for each Cath Lab visit)  Clinical Evaluation Leading to the Procedure:   ACS: Yes.    Non-ACS:    Anginal Classification: CCS IV  Anti-ischemic medical therapy: Maximal Therapy (2 or more classes of medications)  Non-Invasive Test Results: No non-invasive testing performed  Prior CABG: No previous CABG        Collier Salina [facility] 04/01/2016 9:07 AM

## 2016-04-01 NOTE — H&P (Signed)
Patient ID: Aaron Keller MRN: [id number] DOB/AGE: June 08, 1945 71 y.o. Admit date: 04/01/2016  Primary Care Physician: Gara Kroner, MD Primary Cardiologist: New  HPI: 71 yo male with history of HTN, prior CVA in 2002, encephalitis remotely who presented to the ED with c/o weakness, dyspnea, chest pain. His heart rate was in the 30s. Cardiology called to assess. He has a 6 month history of weakness/fatigue. No exertional chest pain over last few months. Gradually worsening dyspnea and fatigue over last 48 hours. He has had difficult to control BP over the last few months. He has never smoked. Currently feels weak with mild upper chest wall pain.   Tele: junctional bradycardia  Review of systems complete and found to be negative unless listed above   Past Medical History:  Diagnosis Date  . Allergy   . Chronic leg pain   . Encephalitis   . Hypertension   . Seizures ([place])   . Stroke [facility])     History reviewed. No pertinent family history.  Social History   Social History  . Marital status: Married    Spouse name: N/A  . Number of children: N/A  . Years of education: N/A   Occupational History  . Not on file.   Social History Main Topics  . Smoking status: Never Smoker  . Smokeless tobacco: Never Used  . Alcohol use No  . Drug use: Unknown  . Sexual activity: Not on file   Other Topics Concern  . Not on file   Social History Narrative  . No narrative on file    Past Surgical History:  Procedure Laterality Date  . APPENDECTOMY    . HERNIA REPAIR      Allergies  Allergen Reactions  . Cyclosporine   . Serzone [Nefazodone Hcl]     Prior to Admission Meds:  Prior to Admission medications   Medication Sig Start Date End Date Taking? Authorizing Provider  carbamazepine (TEGRETOL) 200 MG tablet Take 200 mg by mouth 3 (three) times daily.      Historical Provider, MD  citalopram (CELEXA) 40 MG tablet Take 40 mg by mouth daily.      Historical Provider, MD   clorazepate (TRANXENE-T) 3.75 MG tablet Take 3.75 mg by mouth 2 (two) times daily.      Historical Provider, MD  folic acid (FOLVITE) 1 MG tablet Take 1 mg by mouth daily.      Historical Provider, MD  gabapentin (NEURONTIN) 400 MG tablet Take 400 mg by mouth 4 (four) times daily.      Historical Provider, MD  metoprolol tartrate (LOPRESSOR) 25 MG tablet Take 25 mg by mouth 2 (two) times daily.      Historical Provider, MD         Norvasc 10 mg Q daily Aggrenox 25/200 BID Losartan 100 mg Qdaily  Physical Exam: Blood pressure 115/60, pulse (!) 40, temperature 98.6 F (37 C), temperature source Oral, resp. rate 15, height 5\' 5"  (1.651 m), weight 188 lb (85.3 kg), SpO2 100 %.    General: Well developed, well nourished, ill appearing  HEENT: OP clear, mucus membranes moist  SKIN: warm, dry. No rashes.  Neuro: No focal deficits  Musculoskeletal: Muscle strength 5/5 all ext  Psychiatric: Mood and affect normal  Neck: No JVD, no carotid bruits, no thyromegaly, no lymphadenopathy.  Lungs:Clear bilaterally, no wheezes, rhonci, crackles  Cardiovascular: irregular, brady. No murmurs, gallops or rubs.  Abdomen:Soft. Bowel sounds present. Non-tender.  Extremities: No lower extremity  edema. Pulses are 2 + in the bilateral DP/PT.   Labs:   Lab Results  Component Value Date   WBC 9.8 04/01/2016   HGB 13.6 04/01/2016   HCT 40.0 04/01/2016   MCV 87.3 04/01/2016   PLT 144 (L) 04/01/2016    Recent Labs Lab 04/01/16 0643 04/01/16 0650  NA 131* 130*  K 4.8 4.7  CL 96* 95*  CO2 22  --   BUN 17 21*  CREATININE 1.47* 1.40*  CALCIUM 8.6*  --   PROT 6.5  --   BILITOT 0.7  --   ALKPHOS 33*  --   ALT 18  --   AST 25  --   GLUCOSE 140* 137*   EKG: Junctional bradycardia, rate 45 bpm  ASSESSMENT AND PLAN:   1. Junctional bradycardia/Chest pain: Pt being admitted with bradycardia, weakness, chest pain. Will plan urgent temporary pacemaker placement. I think it is important to exclude  obstructive CAD. Will plan left heart cath. Hold metoprolol. If no revascularization is necessary and heart rate does not recover while holding metoprolol, will need EP consult for pacemaker. Anti-hypertensive medications on hold.   Darlina Guys, MD 04/01/2016, 8:39 AM

## 2016-04-01 NOTE — ED Notes (Signed)
Cardiology team at bedside

## 2016-04-02 ENCOUNTER — Inpatient Hospital Stay (HOSPITAL_COMMUNITY): Payer: BLUE CROSS/BLUE SHIELD

## 2016-04-02 DIAGNOSIS — I2510 Atherosclerotic heart disease of native coronary artery without angina pectoris: Secondary | ICD-10-CM

## 2016-04-02 DIAGNOSIS — I25700 Atherosclerosis of coronary artery bypass graft(s), unspecified, with unstable angina pectoris: Secondary | ICD-10-CM

## 2016-04-02 LAB — BASIC METABOLIC PANEL
ANION GAP: 7 (ref 5–15)
BUN: 10 mg/dL (ref 6–20)
CALCIUM: 8.5 mg/dL — AB (ref 8.9–10.3)
CO2: 27 mmol/L (ref 22–32)
Chloride: 104 mmol/L (ref 101–111)
Creatinine, Ser: 0.8 mg/dL (ref 0.61–1.24)
Glucose, Bld: 96 mg/dL (ref 65–99)
Potassium: 5.2 mmol/L — ABNORMAL HIGH (ref 3.5–5.1)
Sodium: 138 mmol/L (ref 135–145)

## 2016-04-02 LAB — LIPID PANEL
CHOLESTEROL: 102 mg/dL (ref 0–200)
HDL: 40 mg/dL — ABNORMAL LOW (ref >40)
LDL Cholesterol: 43 mg/dL (ref 0–99)
TRIGLYCERIDES: 97 mg/dL (ref <150)
Total CHOL/HDL Ratio: 2.6 RATIO
VLDL: 19 mg/dL (ref 0–40)

## 2016-04-02 LAB — ECHOCARDIOGRAM COMPLETE
Height: 65 in
Weight: 2980.62 oz

## 2016-04-02 LAB — CBC
HCT: 39.7 % (ref 39.0–52.0)
HEMOGLOBIN: 14.2 g/dL (ref 13.0–17.0)
MCH: 31.6 pg (ref 26.0–34.0)
MCHC: 35.8 g/dL (ref 30.0–36.0)
MCV: 88.4 fL (ref 78.0–100.0)
PLATELETS: 107 10*3/uL — AB (ref 150–400)
RBC: 4.49 MIL/uL (ref 4.22–5.81)
RDW: 12 % (ref 11.5–15.5)
WBC: 6.8 10*3/uL (ref 4.0–10.5)

## 2016-04-02 NOTE — Progress Notes (Addendum)
Patient ID: Aaron Keller, male   DOB: 01/13/1946, 71 y.o.   MRN: [id number]   SUBJECTIVE:  No complaints this morning.  I turned his temporary pacemaker rate down to 45, and he is in NSR around 60 bpm.   Scheduled Meds: . amLODipine  10 mg Oral Daily  . aspirin  81 mg Oral Pre-Cath  . atorvastatin  80 mg Oral q1800  . carbamazepine  200 mg Oral TID  . citalopram  40 mg Oral Daily  . clorazepate  3.75 mg Oral BID  . dipyridamole-aspirin  1 capsule Oral Daily  . folic acid  1 mg Oral Daily  . gabapentin  400 mg Oral Q6H  . heparin  5,000 Units Subcutaneous Q8H  . sodium chloride flush  3 mL Intravenous Q12H   Continuous Infusions: . sodium chloride Stopped (04/01/16 1801)   PRN Meds:.sodium chloride, sodium chloride, acetaminophen, nitroGLYCERIN, ondansetron (ZOFRAN) IV, sodium chloride flush, zolpidem    Vitals:   04/02/16 0200 04/02/16 0300 04/02/16 0400 04/02/16 0500  BP: 106/80 113/78 115/73 108/81  Pulse: 73 74 73 73  Resp: 18 15 16 13   Temp:  99.4 F (37.4 C)    TempSrc:  Oral    SpO2: 95% 95% 94% 95%  Weight:  186 lb 4.6 oz (84.5 kg)    Height:        Intake/Output Summary (Last 24 hours) at 04/02/16 0809 Last data filed at 04/02/16 0500  Gross per 24 hour  Intake             1148 ml  Output             2800 ml  Net            -1652 ml    LABS: Basic Metabolic Panel:  Recent Labs  04/01/16 0643 04/01/16 0650 04/01/16 1034 04/02/16 0500  NA 131* 130*  --  138  K 4.8 4.7  --  5.2*  CL 96* 95*  --  104  CO2 22  --   --  27  GLUCOSE 140* 137*  --  96  BUN 17 21*  --  10  CREATININE 1.47* 1.40*  --  0.80  CALCIUM 8.6*  --   --  8.5*  MG 2.2  --  2.1  --    Liver Function Tests:  Recent Labs  04/01/16 0643  AST 25  ALT 18  ALKPHOS 33*  BILITOT 0.7  PROT 6.5  ALBUMIN 4.0   No results for input(s): LIPASE, AMYLASE in the last 72 hours. CBC:  Recent Labs  04/01/16 0643 04/01/16 0650 04/02/16 0500  WBC 9.8  --  6.8  NEUTROABS 7.3  --    --   HGB 14.1 13.6 14.2  HCT 39.8 40.0 39.7  MCV 87.3  --  88.4  PLT 144*  --  107*   Cardiac Enzymes:  Recent Labs  04/01/16 1238 04/01/16 1823  TROPONINI 0.03* 0.04*   BNP: Invalid input(s): POCBNP D-Dimer: No results for input(s): DDIMER in the last 72 hours. Hemoglobin A1C: No results for input(s): HGBA1C in the last 72 hours. Fasting Lipid Panel:  Recent Labs  04/02/16 0500  CHOL 102  HDL 40*  LDLCALC 43  TRIG 97  CHOLHDL 2.6   Thyroid Function Tests:  Recent Labs  04/01/16 1034  TSH 3.644   Anemia Panel: No results for input(s): VITAMINB12, FOLATE, FERRITIN, TIBC, IRON, RETICCTPCT in the last 72 hours.  RADIOLOGY: Dg Chest Port 1  View  Result Date: 04/01/2016 CLINICAL DATA:  Bradycardia.  Chest pain EXAM: PORTABLE CHEST 1 VIEW COMPARISON:  September 09, 2010 FINDINGS: There is no edema or consolidation. Heart is upper normal in size with pulmonary vascularity within normal limits. No adenopathy. No bone lesions. No pneumothorax. IMPRESSION: No edema or consolidation. Electronically Signed   By: Lowella Grip III M.D.   On: 04/01/2016 07:39    PHYSICAL EXAM General: NAD Neck: No JVD, no thyromegaly or thyroid nodule.  Lungs: Clear to auscultation bilaterally with normal respiratory effort. CV: Nondisplaced PMI.  Heart regular S1/S2, no S3/S4, no murmur.  No peripheral edema.  No carotid bruit.  Normal pedal pulses.  Abdomen: Soft, nontender, no hepatosplenomegaly, no distention.  Neurologic: Alert and oriented x 3.  Psych: Normal affect. Extremities: No clubbing or cyanosis.   TELEMETRY: Reviewed telemetry pt in NSR at [place]:  71 yo with history of CVA and HTN presented with complete heart block, rate in 30s.   1. Complete heart block: Initial presentation with HR in 30s, temporary pacer placed.  Metoprolol was stopped.  This morning, he is in NSR around 60 after pacing rate decreased.  - Continue to monitor, leave backup pacing  rate at 40.  He may have recovery of conduction off metoprolol.  Will monitor over weekend for need for permanent pacing.  Leave temporary pacer in for now.  - Will get formal echo.  EF appeared preserved on LV-gram.  2. CAD: Moderate CAD on cath, no interventional target.  This is unlikely to have caused his CHB.  - Continue Aggrenox, statin.  3. H/o CVA: On aggrenox.   Loralie Champagne 04/02/2016 8:13 AM

## 2016-04-03 LAB — CBC
HEMATOCRIT: 38.1 % — AB (ref 39.0–52.0)
HEMOGLOBIN: 13.6 g/dL (ref 13.0–17.0)
MCH: 31.3 pg (ref 26.0–34.0)
MCHC: 35.7 g/dL (ref 30.0–36.0)
MCV: 87.6 fL (ref 78.0–100.0)
Platelets: 96 10*3/uL — ABNORMAL LOW (ref 150–400)
RBC: 4.35 MIL/uL (ref 4.22–5.81)
RDW: 11.6 % (ref 11.5–15.5)
WBC: 4.9 10*3/uL (ref 4.0–10.5)

## 2016-04-03 LAB — BASIC METABOLIC PANEL
ANION GAP: 6 (ref 5–15)
BUN: 12 mg/dL (ref 6–20)
CALCIUM: 8.3 mg/dL — AB (ref 8.9–10.3)
CO2: 27 mmol/L (ref 22–32)
Chloride: 101 mmol/L (ref 101–111)
Creatinine, Ser: 0.75 mg/dL (ref 0.61–1.24)
GFR calc Af Amer: >60 mL/min (ref >60)
GFR calc non Af Amer: >60 mL/min (ref >60)
GLUCOSE: 101 mg/dL — AB (ref 65–99)
POTASSIUM: 4.3 mmol/L (ref 3.5–5.1)
Sodium: 134 mmol/L — ABNORMAL LOW (ref 135–145)

## 2016-04-03 NOTE — Progress Notes (Signed)
Patient ID: Aaron Keller, male   DOB: 04-10-1945, 71 y.o.   MRN: [id number]   SUBJECTIVE:  No complaints this morning.  I turned his temporary pacemaker rate down to 45 yesterday, and he has been in NSR in the 60s without events.   Echo showed EF 65-70%, mild AI.   Scheduled Meds: . amLODipine  10 mg Oral Daily  . atorvastatin  80 mg Oral q1800  . carbamazepine  200 mg Oral TID  . citalopram  40 mg Oral Daily  . clorazepate  3.75 mg Oral BID  . dipyridamole-aspirin  1 capsule Oral Daily  . folic acid  1 mg Oral Daily  . gabapentin  400 mg Oral Q6H  . heparin  5,000 Units Subcutaneous Q8H  . sodium chloride flush  3 mL Intravenous Q12H   Continuous Infusions: . sodium chloride Stopped (04/01/16 1801)   PRN Meds:.sodium chloride, sodium chloride, acetaminophen, nitroGLYCERIN, ondansetron (ZOFRAN) IV, sodium chloride flush, zolpidem    Vitals:   04/03/16 0500 04/03/16 0600 04/03/16 0605 04/03/16 0700  BP: 136/88 (!) 148/97 (!) 148/97 132/82  Pulse: 60 72 66 (!) 59  Resp: 15 20 14 13   Temp:      TempSrc:      SpO2: 94% 95% 95% 93%  Weight: 184 lb 8.4 oz (83.7 kg)     Height:        Intake/Output Summary (Last 24 hours) at 04/03/16 0747 Last data filed at 04/03/16 0700  Gross per 24 hour  Intake              983 ml  Output             1175 ml  Net             -192 ml    LABS: Basic Metabolic Panel:  Recent Labs  04/01/16 0643  04/01/16 1034 04/02/16 0500 04/03/16 0245  NA 131*  < >  --  138 134*  K 4.8  < >  --  5.2* 4.3  CL 96*  < >  --  104 101  CO2 22  --   --  27 27  GLUCOSE 140*  < >  --  96 101*  BUN 17  < >  --  10 12  CREATININE 1.47*  < >  --  0.80 0.75  CALCIUM 8.6*  --   --  8.5* 8.3*  MG 2.2  --  2.1  --   --   < > = values in this interval not displayed. Liver Function Tests:  Recent Labs  04/01/16 0643  AST 25  ALT 18  ALKPHOS 33*  BILITOT 0.7  PROT 6.5  ALBUMIN 4.0   No results for input(s): LIPASE, AMYLASE in the last 72  hours. CBC:  Recent Labs  04/01/16 0643  04/02/16 0500 04/03/16 0245  WBC 9.8  --  6.8 4.9  NEUTROABS 7.3  --   --   --   HGB 14.1  < > 14.2 13.6  HCT 39.8  < > 39.7 38.1*  MCV 87.3  --  88.4 87.6  PLT 144*  --  107* 96*  < > = values in this interval not displayed. Cardiac Enzymes:  Recent Labs  04/01/16 1238 04/01/16 1823  TROPONINI 0.03* 0.04*   BNP: Invalid input(s): POCBNP D-Dimer: No results for input(s): DDIMER in the last 72 hours. Hemoglobin A1C: No results for input(s): HGBA1C in the last 72 hours. Fasting Lipid Panel:  Recent Labs  04/02/16 0500  CHOL 102  HDL 40*  LDLCALC 43  TRIG 97  CHOLHDL 2.6   Thyroid Function Tests:  Recent Labs  04/01/16 1034  TSH 3.644   Anemia Panel: No results for input(s): VITAMINB12, FOLATE, FERRITIN, TIBC, IRON, RETICCTPCT in the last 72 hours.  RADIOLOGY: Dg Chest Port 1 View  Result Date: 04/01/2016 CLINICAL DATA:  Bradycardia.  Chest pain EXAM: PORTABLE CHEST 1 VIEW COMPARISON:  September 09, 2010 FINDINGS: There is no edema or consolidation. Heart is upper normal in size with pulmonary vascularity within normal limits. No adenopathy. No bone lesions. No pneumothorax. IMPRESSION: No edema or consolidation. Electronically Signed   By: Lowella Grip III M.D.   On: 04/01/2016 07:39    PHYSICAL EXAM General: NAD Neck: No JVD, no thyromegaly or thyroid nodule.  Lungs: Clear to auscultation bilaterally with normal respiratory effort. CV: Nondisplaced PMI.  Heart regular S1/S2, no S3/S4, no murmur.  No peripheral edema.  No carotid bruit.  Normal pedal pulses.  Abdomen: Soft, nontender, no hepatosplenomegaly, no distention.  Neurologic: Alert and oriented x 3.  Psych: Normal affect. Extremities: No clubbing or cyanosis.   TELEMETRY: Reviewed telemetry pt in NSR in 60s, no events.   ASSESSMENT AND PLAN:  71 yo with history of CVA and HTN presented with complete heart block, rate in 55s.   1. Complete heart  block: Initial presentation with HR in 30s, temporary pacer placed.  Metoprolol was stopped.  Off metoprolol, HR has been in 60s, NSR.  No pacing.  Echo with EF 65-70%, mild AI.  - Remove temporary pacer today.   - Monitor in step down today, if remains stable can go home tomorrow.  Will need 30 day monitor after discharge.   2. CAD: Moderate CAD on cath, no interventional target.  This is unlikely to have caused his CHB.  - Continue Aggrenox, statin.  3. H/o CVA: On aggrenox.   Loralie Champagne 04/03/2016 7:47 AM

## 2016-04-04 ENCOUNTER — Other Ambulatory Visit: Payer: Self-pay | Admitting: Cardiology

## 2016-04-04 ENCOUNTER — Encounter (HOSPITAL_COMMUNITY): Payer: Self-pay | Admitting: Cardiology

## 2016-04-04 DIAGNOSIS — I25119 Atherosclerotic heart disease of native coronary artery with unspecified angina pectoris: Secondary | ICD-10-CM | POA: Diagnosis present

## 2016-04-04 DIAGNOSIS — T50905A Adverse effect of unspecified drugs, medicaments and biological substances, initial encounter: Secondary | ICD-10-CM

## 2016-04-04 DIAGNOSIS — I10 Essential (primary) hypertension: Secondary | ICD-10-CM

## 2016-04-04 DIAGNOSIS — I2510 Atherosclerotic heart disease of native coronary artery without angina pectoris: Secondary | ICD-10-CM

## 2016-04-04 DIAGNOSIS — N183 Chronic kidney disease, stage 3 unspecified: Secondary | ICD-10-CM

## 2016-04-04 DIAGNOSIS — Z8669 Personal history of other diseases of the nervous system and sense organs: Secondary | ICD-10-CM

## 2016-04-04 HISTORY — DX: Personal history of other diseases of the nervous system and sense organs: Z86.69

## 2016-04-04 HISTORY — DX: Essential (primary) hypertension: I10

## 2016-04-04 HISTORY — DX: Atherosclerotic heart disease of native coronary artery without angina pectoris: I25.10

## 2016-04-04 LAB — BASIC METABOLIC PANEL
Anion gap: 8 (ref 5–15)
BUN: 7 mg/dL (ref 6–20)
CALCIUM: 8.4 mg/dL — AB (ref 8.9–10.3)
CO2: 26 mmol/L (ref 22–32)
CREATININE: 0.78 mg/dL (ref 0.61–1.24)
Chloride: 100 mmol/L — ABNORMAL LOW (ref 101–111)
Glucose, Bld: 106 mg/dL — ABNORMAL HIGH (ref 65–99)
Potassium: 4 mmol/L (ref 3.5–5.1)
Sodium: 134 mmol/L — ABNORMAL LOW (ref 135–145)

## 2016-04-04 MED ORDER — NITROGLYCERIN 0.4 MG SL SUBL: 0.4000 mg | SUBLINGUAL_TABLET | SUBLINGUAL | 4 refills | Status: DC | PRN

## 2016-04-04 MED ORDER — ATORVASTATIN CALCIUM 10 MG PO TABS: 10.0000 mg | ORAL_TABLET | Freq: Every day | ORAL | 6 refills | Status: DC

## 2016-04-04 MED ORDER — LOSARTAN POTASSIUM 50 MG PO TABS: 50.0000 mg | ORAL_TABLET | Freq: Every day | ORAL | 6 refills | Status: DC

## 2016-04-04 NOTE — Discharge Summary (Signed)
Discharge Summary    Patient ID: Aaron Keller,  MRN: [id number], DOB/AGE: 21-Jan-1946 71 y.o.  Admit date: 04-25-16 Discharge date: 04/04/2016  Primary Care Provider: [facility] Primary Cardiologist: Dr. Saunders Revel   Discharge Diagnoses    Principal Problem:   Symptomatic bradycardia, required temp pacer 04-25-16- brady resolved, secondary to BB Active Problems:   Unstable angina (HCC)   CAD in native artery- non obstructive on cath    HTN (hypertension)   Hx of tonic-clonic seizures   Allergies Allergies  Allergen Reactions  . Cyclosporine Other (See Comments)    Unknown  . Serzone [Nefazodone Hcl] Other (See Comments)    Unknown    Diagnostic Studies/Procedures    Procedures April 25, 2016  Left Heart Cath and Coronary Angiography  Temporary Pacemaker  Conclusion     The left ventricular systolic function is normal.  LV end diastolic pressure is mildly elevated.  The left ventricular ejection fraction is 55-65% by visual estimate.  Mid RCA lesion, 60 %stenosed.  Mid LAD lesion, 35 %stenosed.  Dist LAD-1 lesion, 70 %stenosed.  Dist LAD-2 lesion, 90 %stenosed.  Ost LPDA to LPDA lesion, 50 %stenosed.   1. Coronary artery disease    - Mild ectasia in the proximal LAD. The distal LAD is diffusely diseased with a focal area of 90% stenosis    - Dominant LCx with diffuse severe ectasia. Diffuse 50% PDA    - 60% mid RCA 2. Normal LV function 3. Mildly elevated LVEDP.  Plan: recommend treating CAD medically. I suspect his current symptoms are related to his bradycardia. There is not a lesion that would explain his bradycardia. Will leave temporary pacemaker in place. Hold beta blocker. If HR doses not improve will need to consider for permanent pacemaker.     Echo 04/02/16 Study Conclusions  - Left ventricle: The cavity size was normal. There was moderate   concentric hypertrophy. Systolic function was vigorous. The   estimated ejection fraction was  in the range of 65% to 70%. Wall   motion was normal; there were no regional wall motion   abnormalities. Doppler parameters are consistent with abnormal   left ventricular relaxation (grade 1 diastolic dysfunction).   There was no evidence of elevated ventricular filling pressure by   Doppler parameters. - Aortic valve: There was mild regurgitation. - Aortic root: The aortic root was normal in size. - Mitral valve: There was no regurgitation. - Left atrium: The atrium was normal in size. - Right ventricle: Systolic function was normal. - Right atrium: The atrium was normal in size. - Tricuspid valve: There was trivial regurgitation. - Pulmonary arteries: Systolic pressure was within the normal   range. - Inferior vena cava: The vessel was normal in size. - Pericardium, extracardiac: There was no pericardial effusion.  _____________   History of Present Illness     71 yo male with history of HTN, prior CVA in 2002, encephalitis remotely and hx of seizures, who presented to the ED 04-25-2016 with c/o weakness, dyspnea, chest pain. His heart rate was in the 30s. Cardiology called to assess. He has a 6 month history of weakness/fatigue. No exertional chest pain over last few months. Gradually worsening dyspnea and fatigue over last 48 hours. He has had difficult to control BP over the last few months. He has never smoked. On exam he felt weak with mild upper chest wall pain.   Tele: junctional bradycardia- pt taken to cath lab then admitted  Hospital Course  Consultants: none   In cath lab pt found to have non obstructive disease and planned medical therapy.  He had Temp. Pacer placed and BB was stopped.   (Was on lopressor 25 mg BID).  By the next day rate on pacer decreased to 45 and pt maintained HR in the 60s.  By the 14th he continued in SR and T. Pacer removed.    Today he was stable without complaints, his Cr was mildly elevated post cath.  His BP was climbing as well.  He  ambulated without problems.  We resumed his ARB at half the dose and will recheck BMP on Friday as an outpt.  He will follow up with Dr. Saunders Revel.    _____________  Discharge Vitals Blood pressure (!) 142/84, pulse 62, temperature 98.3 F (36.8 C), temperature source Oral, resp. rate 16, height 5\' 5"  (1.651 m), weight 181 lb 1.6 oz (82.1 kg), SpO2 95 %.  Filed Weights   04/02/16 0300 04/03/16 0500 04/04/16 0519  Weight: 186 lb 4.6 oz (84.5 kg) 184 lb 8.4 oz (83.7 kg) 181 lb 1.6 oz (82.1 kg)    Labs & Radiologic Studies    CBC  Recent Labs  04/02/16 0500 04/03/16 0245  WBC 6.8 4.9  HGB 14.2 13.6  HCT 39.7 38.1*  MCV 88.4 87.6  PLT 107* 96*   Basic Metabolic Panel  Recent Labs  04/03/16 0245 04/04/16 0307  NA 134* 134*  K 4.3 4.0  CL 101 100*  CO2 27 26  GLUCOSE 101* 106*  BUN 12 7  CREATININE 0.75 0.78  CALCIUM 8.3* 8.4*   Liver Function Tests No results for input(s): AST, ALT, ALKPHOS, BILITOT, PROT, ALBUMIN in the last 72 hours. No results for input(s): LIPASE, AMYLASE in the last 72 hours. Cardiac Enzymes  Recent Labs  04/01/16 1823  TROPONINI 0.04*   BNP Invalid input(s): POCBNP D-Dimer No results for input(s): DDIMER in the last 72 hours. Hemoglobin A1C No results for input(s): HGBA1C in the last 72 hours. Fasting Lipid Panel  Recent Labs  04/02/16 0500  CHOL 102  HDL 40*  LDLCALC 43  TRIG 97  CHOLHDL 2.6   Thyroid Function Tests TSH 3.644  _____________  Dg Chest Port 1 View  Result Date: 04/01/2016 CLINICAL DATA:  Bradycardia.  Chest pain EXAM: PORTABLE CHEST 1 VIEW COMPARISON:  September 09, 2010 FINDINGS: There is no edema or consolidation. Heart is upper normal in size with pulmonary vascularity within normal limits. No adenopathy. No bone lesions. No pneumothorax. IMPRESSION: No edema or consolidation. Electronically Signed   By: Lowella Grip III M.D.   On: 04/01/2016 07:39   Disposition   Pt is being discharged home today in good  condition.  Follow-up Plans & Appointments   Take 1 NTG, under your tongue, while sitting.  If no relief of pain may repeat NTG, one tab every 5 minutes up to 3 tablets total over 15 minutes.  If no relief CALL 911.  If you have dizziness/lightheadness  while taking NTG, stop taking and call 911.         Call [facility] at [phone] if any bleeding, swelling or drainage at cath site.  May shower, no tub baths for 48 hours for groin sticks. No lifting over 5 pounds for 3 days.  No Driving for 3 days  We decreased your losartan with new script sent in - start tomorrow.  Keep log of BP for Dr. Saunders Revel.  If you experience any symptoms as before call us.  Dr. Saunders Revel will decide about outpt event monitor.    Heart Healthy Diet  We did put you on medication for cholesterol but at lower dose than here.  Will recheck level in 6 weeks.   Have blood work done Friday at the [place] [address] office just to check kidney function.     Follow-up Information    Nelva Bush, MD Follow up on 04/14/2016.   Specialty:  Cardiology Why:  at 2:20 pm keep appt, Contact information: [address] [place] [address] [phone]        [facility] Follow up on 04/08/2016.   Specialty:  Cardiology Why:  for [facility]- labs Contact information: [address] [place] [phone]           Discharge Medications   Current Discharge Medication List    START taking these medications   Details  atorvastatin (LIPITOR) 10 MG tablet Take 1 tablet (10 mg total) by mouth daily. Qty: 30 tablet, Refills: 6    nitroGLYCERIN (NITROSTAT) 0.4 MG SL tablet Place 1 tablet (0.4 mg total) under the tongue every 5 (five) minutes x 3 doses as needed for chest pain. Qty: 25 tablet, Refills: 4      CONTINUE these medications which have CHANGED   Details  losartan (COZAAR) 50 MG tablet Take 1 tablet (50 mg total) by  mouth daily. Qty: 30 tablet, Refills: 6      CONTINUE these medications which have NOT CHANGED   Details  amLODipine (NORVASC) 10 MG tablet Take 10 mg by mouth daily.    carbamazepine (TEGRETOL) 200 MG tablet Take 200 mg by mouth 4 (four) times daily.     citalopram (CELEXA) 40 MG tablet Take 40 mg by mouth daily.      clorazepate (TRANXENE-T) 3.75 MG tablet Take 3.75 mg by mouth 2 (two) times daily.      dipyridamole-aspirin (AGGRENOX) 200-25 MG 12hr capsule Take 2 capsules by mouth at bedtime.     folic acid (FOLVITE) 1 MG tablet Take 1 mg by mouth daily.      gabapentin (NEURONTIN) 400 MG tablet Take 400 mg by mouth 4 (four) times daily.        STOP taking these medications     metoprolol tartrate (LOPRESSOR) 25 MG tablet          Aspirin prescribed at discharge?  Yes aggrenox High Intensity Statin Prescribed? (Lipitor 40-80mg  or Crestor 20-40mg ): No: non obstructive disease Beta Blocker Prescribed? No: caused admit For EF <40%, was ACEI/ARB Prescribed? No: na ADP Receptor Inhibitor Prescribed? (i.e. Plavix etc.-Includes Medically Managed Patients): No: na For EF <40%, Aldosterone Inhibitor Prescribed? No: na  Was EF assessed during THIS hospitalization? Yes Was Cardiac Rehab II ordered? (Included Medically managed Patients): No: NA   Outstanding Labs/Studies   Hepatic and lipids in 6 weeks  BMP 04/08/16  Duration of Discharge Encounter   Greater than 30 minutes including physician time.  Signed, Cecilie Kicks NP 04/04/2016, 12:52 PM

## 2016-04-04 NOTE — Progress Notes (Signed)
Patient Name: Aaron Keller Date of Encounter: 04/04/2016  Primary Cardiologist: new Dr. Melina Schools, though has new outpt appt to se Dr. Saunders Revel on the 25th for HTN  Hospital Problem List     Principal Problem:   Symptomatic bradycardia, required temp pacer 04/01/16- brady resolved, secondary to BB Active Problems:   Unstable angina (HCC)   CAD in native artery- non obstructive on cath    HTN (hypertension)   Hx of tonic-clonic seizures     Subjective   no complaints, no chest pain or SOB  Inpatient Medications    Scheduled Meds: . amLODipine  10 mg Oral Daily  . atorvastatin  80 mg Oral q1800  . carbamazepine  200 mg Oral TID  . citalopram  40 mg Oral Daily  . clorazepate  3.75 mg Oral BID  . dipyridamole-aspirin  1 capsule Oral Daily  . folic acid  1 mg Oral Daily  . gabapentin  400 mg Oral Q6H  . heparin  5,000 Units Subcutaneous Q8H  . sodium chloride flush  3 mL Intravenous Q12H   Continuous Infusions:  PRN Meds: sodium chloride, acetaminophen, nitroGLYCERIN, ondansetron (ZOFRAN) IV, sodium chloride flush, zolpidem   Vital Signs    Vitals:   04/03/16 2218 04/04/16 0007 04/04/16 0519 04/04/16 0816  BP:  (!) 146/75 (!) 157/78 (!) 142/84  Pulse:  69 69 62  Resp:  15 14 16   Temp: 98.9 F (37.2 C) 98.8 F (37.1 C) 98.5 F (36.9 C) 98.3 F (36.8 C)  TempSrc: Oral Oral Oral Oral  SpO2: 97% 95% 98% 95%  Weight:   181 lb 1.6 oz (82.1 kg)   Height:        Intake/Output Summary (Last 24 hours) at 04/04/16 0826 Last data filed at 04/03/16 2220  Gross per 24 hour  Intake              483 ml  Output                0 ml  Net              483 ml   Filed Weights   04/02/16 0300 04/03/16 0500 04/04/16 0519  Weight: 186 lb 4.6 oz (84.5 kg) 184 lb 8.4 oz (83.7 kg) 181 lb 1.6 oz (82.1 kg)    Physical Exam   GEN: Well nourished,  in no acute distress. Having breakfast HEENT: normocephalic, sclera clear, mucus membranes moist.  Neck: Supple, no JVD, or  masses. Cardiac: RRR, no murmurs, rubs, or gallops. No clubbing, cyanosis, edema.  Radials/DP/PT 2+ and equal bilaterally.  Respiratory:  Respirations regular and unlabored, clear to auscultation bilaterally without rales, rhonchi or wheezes. GI: Abd -Soft, nontender, nondistended, BS + x 4. MS: no deformity or atrophy. Skin: warm and dry, brisk capillary refill, no obvious rash Neuro:  Alert and oriented X 3 MAE, follows commands Psych: answers questions appropriately,Normal and pleasant affect.   Labs    CBC  Recent Labs  04/02/16 0500 04/03/16 0245  WBC 6.8 4.9  HGB 14.2 13.6  HCT 39.7 38.1*  MCV 88.4 87.6  PLT 107* 96*   Basic Metabolic Panel  Recent Labs  04/01/16 1034  04/03/16 0245 04/04/16 0307  NA  --   < > 134* 134*  K  --   < > 4.3 4.0  CL  --   < > 101 100*  CO2  --   < > 27 26  GLUCOSE  --   < >  101* 106*  BUN  --   < > 12 7  CREATININE  --   < > 0.75 0.78  CALCIUM  --   < > 8.3* 8.4*  MG 2.1  --   --   --   < > = values in this interval not displayed. Liver Function Tests No results for input(s): AST, ALT, ALKPHOS, BILITOT, PROT, ALBUMIN in the last 72 hours. No results for input(s): LIPASE, AMYLASE in the last 72 hours. Cardiac Enzymes  Recent Labs  04/01/16 1238 04/01/16 1823  TROPONINI 0.03* 0.04*   BNP Invalid input(s): POCBNP D-Dimer No results for input(s): DDIMER in the last 72 hours. Hemoglobin A1C No results for input(s): HGBA1C in the last 72 hours. Fasting Lipid Panel  Recent Labs  04/02/16 0500  CHOL 102  HDL 40*  LDLCALC 43  TRIG 97  CHOLHDL 2.6   Thyroid Function Tests  Recent Labs  04/01/16 1034  TSH 3.644    Telemetry    SR - at times ST to 115 with PVCs.  Personally Reviewed  ECG    04/03/16 SR rate of 62 non specific ST changes. HR improved - Personally Reviewed  Radiology    No results found.  Cardiac Studies   04/01/16 Procedures   Left Heart Cath and Coronary Angiography  Temporary  Pacemaker  Conclusion     The left ventricular systolic function is normal.  LV end diastolic pressure is mildly elevated.  The left ventricular ejection fraction is 55-65% by visual estimate.  Mid RCA lesion, 60 %stenosed.  Mid LAD lesion, 35 %stenosed.  Dist LAD-1 lesion, 70 %stenosed.  Dist LAD-2 lesion, 90 %stenosed.  Ost LPDA to LPDA lesion, 50 %stenosed.   1. Coronary artery disease    - Mild ectasia in the proximal LAD. The distal LAD is diffusely diseased with a focal area of 90% stenosis    - Dominant LCx with diffuse severe ectasia. Diffuse 50% PDA    - 60% mid RCA 2. Normal LV function 3. Mildly elevated LVEDP.  Plan: recommend treating CAD medically. I suspect his current symptoms are related to his bradycardia. There is not a lesion that would explain his bradycardia. Will leave temporary pacemaker in place. Hold beta blocker. If HR doses not improve will need to consider for permanent pacemaker.    Echo: Study Conclusions  - Left ventricle: The cavity size was normal. There was moderate   concentric hypertrophy. Systolic function was vigorous. The   estimated ejection fraction was in the range of 65% to 70%. Wall   motion was normal; there were no regional wall motion   abnormalities. Doppler parameters are consistent with abnormal   left ventricular relaxation (grade 1 diastolic dysfunction).   There was no evidence of elevated ventricular filling pressure by   Doppler parameters. - Aortic valve: There was mild regurgitation. - Aortic root: The aortic root was normal in size. - Mitral valve: There was no regurgitation. - Left atrium: The atrium was normal in size. - Right ventricle: Systolic function was normal. - Right atrium: The atrium was normal in size. - Tricuspid valve: There was trivial regurgitation. - Pulmonary arteries: Systolic pressure was within the normal   range. - Inferior vena cava: The vessel was normal in size. - Pericardium,  extracardiac: There was no pericardial effusion.   Patient Profile     71 y.o. male with PMHx of HTN, prior CVA in 2002, encephalitis with increasing fatigue, weakness and SOB more acutely in the  last couple days though generally poor energy for about 6 months.  He was noted in the ER to have profound bradycardia with rates in 30's, cardiology was called and emergently temp pacing wire placed.   Assessment & Plan    71 yo with history of CVA and HTN presented with complete heart block, rate in 30s.   1. Complete heart block: Initial presentation with HR in 30s, temporary pacer placed.  Metoprolol was stopped.  Off metoprolol, HR has been in 60s, NSR.  No pacing.  Echo with EF 65-70%, mild AI.  - Removed temporary pacer 04/03/16  - Monitor in step down today, ?d/c.  Will need 30 day monitor after discharge.    2. CAD: Moderate CAD on cath, no interventional target.  This is unlikely to have caused his CHB.  - Continue Aggrenox, statin.   3. HTN has been difficult to control as outpt and was to see Dr. Saunders Revel on the 25th.  BP since back in SR is elevated. On amlodipine 10- ? Add arb but watch CR?  4. CKD-3 with Cr now 1.47    5. H/o CVA: On aggrenox.   6. Hx seizures  Signed, Aaron Kicks, NP  04/04/2016, 8:26 AM  [place] Pager [phone]  After 5 or weekends [phone]  Agree with note written by Aaron Keller RNP   t admitted for symptomatic brady (HR 30s) on BB which has been D/Cd with improvement in HR and Sx. Cath showed no signif CAD and Nl LV Fxn. BP borderline high. On amlodipine and was on Losartan as OP (will restart at lower dose). Exam benign. OK for DC home today . Pt has appointment with Dr End 1/25 to F/U BP.  Quay Burow 04/04/2016 9:55 AM

## 2016-04-04 NOTE — Discharge Instructions (Signed)
Take 1 NTG, under your tongue, while sitting.  If no relief of pain may repeat NTG, one tab every 5 minutes up to 3 tablets total over 15 minutes.  If no relief CALL 911.  If you have dizziness/lightheadness  while taking NTG, stop taking and call 911.         Call [facility] at [phone] if any bleeding, swelling or drainage at cath site.  May shower, no tub baths for 48 hours for groin sticks. No lifting over 5 pounds for 3 days.  No Driving for 3 days  We decreased your losartan with new script sent in - start tomorrow.  Keep log of BP for Dr. Saunders Revel.    If you experience any symptoms as before call us.  Dr. Saunders Revel will decide about outpt event monitor.    Heart Healthy Diet  We did put you on medication for cholesterol but at lower dose than here.  Will recheck level in 6 weeks.   Have blood work done Friday at the [address] office just to check kidney function.

## 2016-04-08 ENCOUNTER — Other Ambulatory Visit: Payer: BLUE CROSS/BLUE SHIELD | Admitting: *Deleted

## 2016-04-08 DIAGNOSIS — N183 Chronic kidney disease, stage 3 unspecified: Secondary | ICD-10-CM

## 2016-04-08 DIAGNOSIS — T50905A Adverse effect of unspecified drugs, medicaments and biological substances, initial encounter: Secondary | ICD-10-CM

## 2016-04-09 LAB — BASIC METABOLIC PANEL
BUN / CREAT RATIO: 12 (ref 10–24)
BUN: 9 mg/dL (ref 8–27)
CO2: 25 mmol/L (ref 18–29)
CREATININE: 0.73 mg/dL — AB (ref 0.76–1.27)
Calcium: 8.6 mg/dL (ref 8.6–10.2)
Chloride: 93 mmol/L — ABNORMAL LOW (ref 96–106)
GFR, EST AFRICAN AMERICAN: 109 mL/min/{1.73_m2} (ref >59)
GFR, EST NON AFRICAN AMERICAN: 95 mL/min/{1.73_m2} (ref >59)
Glucose: 111 mg/dL — ABNORMAL HIGH (ref 65–99)
Potassium: 4.7 mmol/L (ref 3.5–5.2)
Sodium: 135 mmol/L (ref 134–144)

## 2016-04-14 ENCOUNTER — Encounter: Payer: Self-pay | Admitting: Internal Medicine

## 2016-04-14 ENCOUNTER — Ambulatory Visit (INDEPENDENT_AMBULATORY_CARE_PROVIDER_SITE_OTHER): Payer: BLUE CROSS/BLUE SHIELD | Admitting: Internal Medicine

## 2016-04-14 VITALS — BP 160/90 | HR 68 | Ht 65.0 in | Wt 186.0 lb

## 2016-04-14 DIAGNOSIS — I2510 Atherosclerotic heart disease of native coronary artery without angina pectoris: Secondary | ICD-10-CM | POA: Diagnosis not present

## 2016-04-14 DIAGNOSIS — I10 Essential (primary) hypertension: Secondary | ICD-10-CM

## 2016-04-14 DIAGNOSIS — I455 Other specified heart block: Secondary | ICD-10-CM

## 2016-04-14 DIAGNOSIS — R5383 Other fatigue: Secondary | ICD-10-CM | POA: Diagnosis not present

## 2016-04-14 MED ORDER — LOSARTAN POTASSIUM 100 MG PO TABS
100.0000 mg | ORAL_TABLET | Freq: Every day | ORAL | 1 refills | Status: DC
Start: 2016-04-14 — End: 2016-09-09

## 2016-04-14 NOTE — Patient Instructions (Signed)
Medication Instructions:  Increase losartan to 100mg  daily. You can take 2 of your 50mg  tablets daily at the same time and use your current supply.  Labwork: BMET in about 2 weeks.  Testing/Procedures: Your physician has requested that you have an exercise tolerance test. For further information please visit [email or web address]. Please also follow instruction sheet, as given.  In about 2 weeks when you have your lab.   Follow-Up: Your physician recommends that you schedule a follow-up appointment in: 2 months with Dr End.    ).     If you need a refill on your cardiac medications before your next appointment, please call your pharmacy.

## 2016-04-14 NOTE — Progress Notes (Signed)
Follow-up Outpatient Visit Date: 04/14/2016  Primary Care Provider: Gara Kroner, MD [place] [id number]  Chief Complaint: Fatigue and recent hospitalization for low heart rate  HPI:  Aaron Keller is a 71 y.o. year-old male with history of hypertension, stroke (2002) and encephalitis, who presents for follow-up of fatigue and recent hospitalization for bradycardia.  He presented to [facility] hospital with chest pain, weakness and dizziness, and was found to have a HR in the 30's.  EKG showed sinus arrest with junctional rhythm.  The patient underwent coronary angiography and temporary transvenous pacemaker placement.  Following consultation with EP, the decision was made to hold his beta-blocker and reassess his heart rate after washout of the medication.  He returned to normal sinus rhythm with resolution of chest pain and dizziness.  Since leaving the hospital, Aaron Keller has continued to have almost constant fatigue.  He sleeps well and night but has no energy throughout the day.  He denies chest pain, shortness of breath, and palpitation.  He notes a history of autonomic dysfunction following encephalitis in 1986, though this had been well-controlled after consulting with a neurologist/cardiologist in [place] (Dr. Raquel Sarna).  However, last year he began having intermittent episodes of lightheadedness (most often while driving) without syncope.  He reports having been on a stable dose of beta-blockers for several years prior to his episode earlier this month.  --------------------------------------------------------------------------------------------------  Cardiovascular History & Procedures: Cardiovascular Problems:  Sinus arrest with junctional bradycardia in the setting of beta-blocker use  Stroke (2002)  Coronary artery disease  Risk Factors:  Known CAD, stroke, hypertension, male gender, and age > 24  Cath/PCI:  LHC (04/01/16): Mild ectasia of the  proximal LAD.  Distal LAD diffusely diseased with focal area of 90% stenosis.  Dominant LCx with diffuse ectasia and diffuse 50% stenosis of lPDA.  60% mid RCA lesion.  Normal LV contraction.  Mildly elevated LVEDP.  CV Surgery:  None  EP Procedures and Devices:  Temporary transvenous pacemaker (04/01/16)  Non-Invasive Evaluation(s):  Transthoracic echocardiogram (04/02/16): Normal LV size with moderate LVH.  LVEF 65-70% with normal wall motion.  Grade 1 diastolic dysfunction.  Mild aortic regurgitation.  Normal RV size and function.  Recent CV Pertinent Labs: Lab Results  Component Value Date   CHOL 102 04/02/2016   HDL 40 (L) 04/02/2016   LDLCALC 43 04/02/2016   TRIG 97 04/02/2016   CHOLHDL 2.6 04/02/2016   INR 0.99 04/01/2016   K 4.7 04/08/2016   MG 2.1 04/01/2016   BUN 9 04/08/2016   CREATININE 0.73 (L) 04/08/2016    Past medical and surgical history were reviewed and updated in EPIC.  Outpatient Encounter Prescriptions as of 04/14/2016  Medication Sig  . amLODipine (NORVASC) 10 MG tablet Take 10 mg by mouth daily.  Marland Kitchen atorvastatin (LIPITOR) 10 MG tablet Take 1 tablet (10 mg total) by mouth daily.  . carbamazepine (TEGRETOL) 200 MG tablet Take 200 mg by mouth 4 (four) times daily.   . citalopram (CELEXA) 40 MG tablet Take 40 mg by mouth daily.    . clorazepate (TRANXENE-T) 3.75 MG tablet Take 3.75 mg by mouth 2 (two) times daily.    Marland Kitchen dipyridamole-aspirin (AGGRENOX) 200-25 MG 12hr capsule Take 2 capsules by mouth at bedtime.   . folic acid (FOLVITE) 1 MG tablet Take 1 mg by mouth daily.    Marland Kitchen gabapentin (NEURONTIN) 400 MG tablet Take 400 mg by mouth 4 (four) times daily.    Marland Kitchen  losartan (COZAAR) 50 MG tablet Take 1 tablet (50 mg total) by mouth daily.  . nitroGLYCERIN (NITROSTAT) 0.4 MG SL tablet Place 1 tablet (0.4 mg total) under the tongue every 5 (five) minutes x 3 doses as needed for chest pain.   No facility-administered encounter medications on file as of 04/14/2016.       Allergies: Cyclosporine and Serzone [nefazodone hcl]  Social History   Social History  . Marital status: Married    Spouse name: N/A  . Number of children: N/A  . Years of education: N/A   Occupational History  . Not on file.   Social History Main Topics  . Smoking status: Never Smoker  . Smokeless tobacco: Never Used  . Alcohol use No  . Drug use: Unknown  . Sexual activity: Not on file   Other Topics Concern  . Not on file   Social History Narrative  . No narrative on file    Family History  Problem Relation Age of Onset  . Heart attack Mother     in her 36's  . Cancer Father     liver    Review of Systems: A 12-system review of systems was performed and was negative except as noted in the HPI.  --------------------------------------------------------------------------------------------------  Physical Exam: BP (!) 160/90 (BP Location: Right Arm, Patient Position: Sitting, Cuff Size: Normal)   Pulse 68   Ht 5\' 5"  (1.651 m)   Wt 186 lb (84.4 kg)   BMI 30.95 kg/m   General:  Overweight man, seated comfortably in the exam room. HEENT: No conjunctival pallor or scleral icterus.  Moist mucous membranes.  OP clear. Neck: Supple without lymphadenopathy, thyromegaly, JVD, or HJR.  No carotid bruit. Lungs: Normal work of breathing.  Clear to auscultation bilaterally without wheezes or crackles. Heart: Regular rate and rhythm without murmurs, rubs, or gallops.  Non-displaced PMI. Abd: Bowel sounds present.  Soft, NT/ND without hepatosplenomegaly Ext: No lower extremity edema.  Radial, PT, and DP pulses are 2+ bilaterally. Skin: warm and dry without rash  EKG:  Normal sinus rhythm with left anterior fascicular block. Compared with prior tracing from 04/03/16, nonspecific T-wave changes are less evident today (I have personally reviewed both tracings).  Lab Results  Component Value Date   WBC 4.9 04/03/2016   HGB 13.6 04/03/2016   HCT 38.1 (L) 04/03/2016    MCV 87.6 04/03/2016   PLT 96 (L) 04/03/2016    Lab Results  Component Value Date   NA 135 04/08/2016   K 4.7 04/08/2016   CL 93 (L) 04/08/2016   CO2 25 04/08/2016   BUN 9 04/08/2016   CREATININE 0.73 (L) 04/08/2016   GLUCOSE 111 (H) 04/08/2016   ALT 18 04/01/2016    Lab Results  Component Value Date   CHOL 102 04/02/2016   HDL 40 (L) 04/02/2016   LDLCALC 43 04/02/2016   TRIG 97 04/02/2016   CHOLHDL 2.6 04/02/2016    --------------------------------------------------------------------------------------------------  ASSESSMENT AND PLAN: Fatigue and history of sinus arrest EKG today demonstrates normal sinus rhythm with HR in the 60's.  The patient notes resolution of his chest pain and dizziness since hospital discharge.  However, I am concerned that chronotropic incompetence could be playing a role in his persistent fatigue.  We have agreed to perform an exercise tolerance test to assess his heart rate response to exercise and functional capacity.  If this is unrevealing, we will proceed with 30-day event monitor to evaluate for significant tachy- or bradyarrhythmias.  We  will continue to avoid beta-blockers and other nodal blocking agents.  Hypertension Blood pressure is suboptimally controlled.  We will increase losartan to 100 mg daily and obtain a BMP in ~2 weeks to reassess his renal function and electrolytes.  Coronary artery disease Patient is currently without angina.  Chest pain at the time of admission on 04/01/16 was likely symptomatic bradycardia.  Catheterization (I have personally reviewed the images) showed multivessel CAD that we will continue to manage medically.  We will continue with atorvastatin and aspirin (part of Aggrenox).  If fatigue remains a problem despite aforementioned work-up, trial of isosorbide mononitrate could be considered in case this represents his anginal equivalent.  Follow-up: Return to clinic in 2 months.  Nelva Bush,  MD 04/14/2016 2:17 PM

## 2016-04-16 DIAGNOSIS — I455 Other specified heart block: Secondary | ICD-10-CM | POA: Insufficient documentation

## 2016-04-16 DIAGNOSIS — R5383 Other fatigue: Secondary | ICD-10-CM | POA: Insufficient documentation

## 2016-04-18 MED FILL — Medication: Qty: 1 | Status: AC

## 2016-04-28 ENCOUNTER — Ambulatory Visit (INDEPENDENT_AMBULATORY_CARE_PROVIDER_SITE_OTHER): Payer: BLUE CROSS/BLUE SHIELD

## 2016-04-28 ENCOUNTER — Other Ambulatory Visit: Payer: BLUE CROSS/BLUE SHIELD | Admitting: *Deleted

## 2016-04-28 DIAGNOSIS — I10 Essential (primary) hypertension: Secondary | ICD-10-CM

## 2016-04-28 DIAGNOSIS — R5383 Other fatigue: Secondary | ICD-10-CM | POA: Diagnosis not present

## 2016-04-28 DIAGNOSIS — I455 Other specified heart block: Secondary | ICD-10-CM | POA: Diagnosis not present

## 2016-04-28 DIAGNOSIS — I2510 Atherosclerotic heart disease of native coronary artery without angina pectoris: Secondary | ICD-10-CM | POA: Diagnosis not present

## 2016-04-28 LAB — BASIC METABOLIC PANEL
BUN / CREAT RATIO: 10 (ref 10–24)
BUN: 7 mg/dL — AB (ref 8–27)
CO2: 24 mmol/L (ref 18–29)
CREATININE: 0.72 mg/dL — AB (ref 0.76–1.27)
Calcium: 8.7 mg/dL (ref 8.6–10.2)
Chloride: 92 mmol/L — ABNORMAL LOW (ref 96–106)
GFR calc Af Amer: 110 mL/min/{1.73_m2} (ref >59)
GFR, EST NON AFRICAN AMERICAN: 95 mL/min/{1.73_m2} (ref >59)
Glucose: 98 mg/dL (ref 65–99)
Potassium: 4.7 mmol/L (ref 3.5–5.2)
SODIUM: 132 mmol/L — AB (ref 134–144)

## 2016-04-29 LAB — EXERCISE TOLERANCE TEST
CHL CUP MPHR: 151 {beats}/min
CHL CUP STRESS STAGE 1 DBP: 91 mmHg
CHL CUP STRESS STAGE 1 GRADE: 0 %
CHL CUP STRESS STAGE 3 GRADE: 0 %
CHL CUP STRESS STAGE 3 SPEED: 1 mph
CHL CUP STRESS STAGE 4 HR: 67 {beats}/min
CHL CUP STRESS STAGE 4 SPEED: 1 mph
CHL CUP STRESS STAGE 5 DBP: 92 mmHg
CHL CUP STRESS STAGE 5 SPEED: 1.7 mph
CHL CUP STRESS STAGE 6 GRADE: 12 %
CHL CUP STRESS STAGE 7 GRADE: 0 %
CHL CUP STRESS STAGE 7 HR: 96 {beats}/min
CHL CUP STRESS STAGE 7 SBP: 209 mmHg
CHL CUP STRESS STAGE 7 SPEED: 0 mph
CHL CUP STRESS STAGE 8 DBP: 84 mmHg
CHL CUP STRESS STAGE 8 GRADE: 0 %
CHL CUP STRESS STAGE 8 HR: 68 {beats}/min
CHL CUP STRESS STAGE 8 SBP: 168 mmHg
CSEPEW: 7 METS
CSEPPMHR: 76 %
Exercise duration (min): 5 min
Exercise duration (sec): 0 s
Peak HR: 115 {beats}/min
Percent HR: 76 %
RPE: 16
Rest HR: 62 {beats}/min
Stage 1 HR: 68 {beats}/min
Stage 1 SBP: 152 mmHg
Stage 1 Speed: 0 mph
Stage 2 Grade: 0 %
Stage 2 HR: 68 {beats}/min
Stage 2 Speed: 0 mph
Stage 3 HR: 67 {beats}/min
Stage 4 Grade: 0 %
Stage 5 Grade: 10 %
Stage 5 HR: 95 {beats}/min
Stage 5 SBP: 196 mmHg
Stage 6 HR: 115 {beats}/min
Stage 6 Speed: 2.5 mph
Stage 7 DBP: 98 mmHg
Stage 8 Speed: 0 mph

## 2016-05-02 ENCOUNTER — Telehealth: Payer: Self-pay | Admitting: *Deleted

## 2016-05-02 DIAGNOSIS — I455 Other specified heart block: Secondary | ICD-10-CM

## 2016-05-02 DIAGNOSIS — R5383 Other fatigue: Secondary | ICD-10-CM

## 2016-05-02 DIAGNOSIS — R001 Bradycardia, unspecified: Secondary | ICD-10-CM

## 2016-05-02 NOTE — Telephone Encounter (Signed)
Notes Recorded by Nelva Bush, MD on 05/02/2016 Please let Aaron Keller know that his heart rate increased appropriately to exercise, though he did not make it all the way to target heart rate. We will proceed with 30-day event monitor to evaluate for causes of his fatigue with history of low heart rate leading to hospitalization last month.

## 2016-05-03 ENCOUNTER — Ambulatory Visit (INDEPENDENT_AMBULATORY_CARE_PROVIDER_SITE_OTHER): Payer: BLUE CROSS/BLUE SHIELD

## 2016-05-03 DIAGNOSIS — R5383 Other fatigue: Secondary | ICD-10-CM

## 2016-05-03 DIAGNOSIS — I455 Other specified heart block: Secondary | ICD-10-CM | POA: Diagnosis not present

## 2016-05-03 DIAGNOSIS — R001 Bradycardia, unspecified: Secondary | ICD-10-CM | POA: Diagnosis not present

## 2016-06-01 ENCOUNTER — Encounter: Payer: Self-pay | Admitting: Internal Medicine

## 2016-06-10 ENCOUNTER — Encounter: Payer: Self-pay | Admitting: *Deleted

## 2016-06-10 ENCOUNTER — Ambulatory Visit (INDEPENDENT_AMBULATORY_CARE_PROVIDER_SITE_OTHER): Payer: BLUE CROSS/BLUE SHIELD | Admitting: Internal Medicine

## 2016-06-10 ENCOUNTER — Encounter: Payer: Self-pay | Admitting: Internal Medicine

## 2016-06-10 VITALS — BP 138/80 | HR 69 | Ht 64.0 in | Wt 187.0 lb

## 2016-06-10 VITALS — BP 138/80 | HR 69 | Ht 64.0 in | Wt 187.2 lb

## 2016-06-10 DIAGNOSIS — I2510 Atherosclerotic heart disease of native coronary artery without angina pectoris: Secondary | ICD-10-CM

## 2016-06-10 DIAGNOSIS — R5383 Other fatigue: Secondary | ICD-10-CM | POA: Diagnosis not present

## 2016-06-10 DIAGNOSIS — I495 Sick sinus syndrome: Secondary | ICD-10-CM | POA: Diagnosis not present

## 2016-06-10 DIAGNOSIS — R0683 Snoring: Secondary | ICD-10-CM | POA: Diagnosis not present

## 2016-06-10 DIAGNOSIS — I10 Essential (primary) hypertension: Secondary | ICD-10-CM

## 2016-06-10 NOTE — Patient Instructions (Signed)
Medication Instructions:    Your physician recommends that you continue on your current medications as directed. Please refer to the Current Medication list given to you today.  --- If you need a refill on your cardiac medications before your next appointment, please call your pharmacy. ---  Labwork:  None ordered  Testing/Procedures: Your physician has recommended that you have a pacemaker inserted. A pacemaker is a small device that is placed under the skin of your chest or abdomen to help control abnormal heart rhythms. This device uses electrical pulses to prompt the heart to beat at a normal rate. Pacemakers are used to treat heart rhythms that are too slow. Wire (leads) are attached to the pacemaker that goes into the chambers of you heart. This is done in the hospital and usually requires and overnight stay. Please see the instruction sheet given to you today for more information.  Follow-Up:  You have been referred to Dr. Maxwell Caul for a sleep study.    Your physician recommends that you schedule a wound check appointment, 10-14 days after your procedure on 06/28/2016, with the device clinic.   Your physician recommends that you schedule a follow up appointment in 3 months, after your procedure on 06/28/2016, with Dr. Rayann Heman.  Thank you for choosing [facility]!!     Any Other Special Instructions Will Be Listed Below (If Applicable).   Pacemaker Implantation, Adult Pacemaker implantation is a procedure to place a pacemaker inside your chest. A pacemaker is a small computer that sends electrical signals to the heart and helps your heart beat normally. A pacemaker also stores information about your heart rhythms. You may need pacemaker implantation if you:  Have a slow heartbeat (bradycardia).  Faint (syncope).  Have shortness of breath (dyspnea) due to heart problems. The pacemaker attaches to your heart through a wire, called a lead. Sometimes just one lead is needed.  Other times, there will be two leads. There are two types of pacemakers:  Transvenous pacemaker. This type is placed under the skin or muscle of your chest. The lead goes through a vein in the chest area to reach the inside of the heart.  Epicardial pacemaker. This type is placed under the skin or muscle of your chest or belly. The lead goes through your chest to the outside of the heart. Tell a health care provider about:  Any allergies you have.  All medicines you are taking, including vitamins, herbs, eye drops, creams, and over-the-counter medicines.  Any problems you or family members have had with anesthetic medicines.  Any blood or bone disorders you have.  Any surgeries you have had.  Any medical conditions you have.  Whether you are pregnant or may be pregnant. What are the risks? Generally, this is a safe procedure. However, problems may occur, including:  Infection.  Bleeding.  Failure of the pacemaker or the lead.  Collapse of a lung or bleeding into a lung.  Blood clot inside a blood vessel with a lead.  Damage to the heart.  Infection inside the heart (endocarditis).  Allergic reactions to medicines. What happens before the procedure? Staying hydrated  Follow instructions from your health care provider about hydration, which may include:  Up to 2 hours before the procedure - you may continue to drink clear liquids, such as water, clear fruit juice, black coffee, and plain tea. Eating and drinking restrictions  Follow instructions from your health care provider about eating and drinking, which may include:  8 hours before the  procedure - stop eating heavy meals or foods such as meat, fried foods, or fatty foods.  6 hours before the procedure - stop eating light meals or foods, such as toast or cereal.  6 hours before the procedure - stop drinking milk or drinks that contain milk.  2 hours before the procedure - stop drinking clear liquids. Medicines     Ask your health care provider about:  Changing or stopping your regular medicines. This is especially important if you are taking diabetes medicines or blood thinners.  Taking medicines such as aspirin and ibuprofen. These medicines can thin your blood. Do not take these medicines before your procedure if your health care provider instructs you not to.  You may be given antibiotic medicine to help prevent infection. General instructions   You will have a heart evaluation. This may include an electrocardiogram (ECG), chest X-ray, and heart imaging (echocardiogram,  or echo) tests.  You will have blood tests.  Do not use any products that contain nicotine or tobacco, such as cigarettes and e-cigarettes. If you need help quitting, ask your health care provider.  Plan to have someone take you home from the hospital or clinic.  If you will be going home right after the procedure, plan to have someone with you for 24 hours.  Ask your health care provider how your surgical site will be marked or identified. What happens during the procedure?  To reduce your risk of infection:  Your health care team will wash or sanitize their hands.  Your skin will be washed with soap.  Hair may be removed from the surgical area.  An IV tube will be inserted into one of your veins.  You will be given one or more of the following:  A medicine to help you relax (sedative).  A medicine to numb the area (local anesthetic).  A medicine to make you fall asleep (general anesthetic).  If you are getting a transvenous pacemaker:  An incision will be made in your upper chest.  A pocket will be made for the pacemaker. It may be placed under the skin or between layers of muscle.  The lead will be inserted into a blood vessel that returns to the heart.  While X-rays are taken by an imaging machine (fluoroscopy), the lead will be advanced through the vein to the inside of your heart.  The other end  of the lead will be tunneled under the skin and attached to the pacemaker.  If you are getting an epicardial pacemaker:  An incision will be made near your ribs or breastbone (sternum) for the lead.  The lead will be attached to the outside of your heart.  Another incision will be made in your chest or upper belly to create a pocket for the pacemaker.  The free end of the lead will be tunneled under the skin and attached to the pacemaker.  The transvenous or epicardial pacemaker will be tested. Imaging studies may be done to check the lead position.  The incisions will be closed with stitches (sutures), adhesive strips, or skin glue.  Bandages (dressing) will be placed over the incisions. The procedure may vary among health care providers and hospitals. What happens after the procedure?  Your blood pressure, heart rate, breathing rate, and blood oxygen level will be monitored until the medicines you were given have worn off.  You will be given antibiotics and pain medicine.  ECG and chest x-rays will be done.  You will wear  a continuous type of ECG (Holter monitor) to check your heart rhythm.  Your health care provider willprogram the pacemaker.  Do not drive for 24 hours if you received a sedative. This information is not intended to replace advice given to you by your health care provider. Make sure you discuss any questions you have with your health care provider. Document Released: 02/25/2002 Document Revised: 09/25/2015 Document Reviewed: 08/19/2015 Elsevier Interactive Patient Education  2017 [employer].

## 2016-06-10 NOTE — Patient Instructions (Signed)
Medication Instructions:  Your physician recommends that you continue on your current medications as directed. Please refer to the Current Medication list given to you today.   Labwork: None   Testing/Procedures: None   Follow-Up: Your physician recommends that you schedule a follow-up appointment in: 3 months with Dr End.     Any Other Special Instructions Will Be Listed Below (If Applicable).  Dr Thompson Grayer will see you today.     If you need a refill on your cardiac medications before your next appointment, please call your pharmacy.

## 2016-06-10 NOTE — Progress Notes (Signed)
Follow-up Outpatient Visit Date: 06/10/2016  Primary Care Provider: [place] [id number]  Chief Complaint: Fatigue and recent hospitalization for low heart rate  HPI:  Aaron Keller is a 71 y.o. year-old male with history of hypertension, stroke (2002) and encephalitis, who presents for follow-up of fatigue and recent hospitalization for bradycardia. I last saw him on 04/14/16 for hospital follow-up. He had presented with sinus arrest and a junctional rhythm in the 30s. Temperature transvenous pacemaker was placed and metoprolol held with subsequent return of sinus rhythm. Cardiac catheterization at time revealed 90% apical LAD stenosis and otherwise nonobstructive CAD. This was managed medically. Her last visit, we agreed to obtain an exercise tolerance test to evaluate chronotropic competence, followed by 30-day event monitor.  Today, Aaron Keller reports considerable fatigue. He will feel fine some days and then all of a sudden become very hired and short of breath, even at rest. He notes pauses and skipped beats when he feels his pulse during these episodes. He denies chest pain, orthopnea, PND, and leg edema. He has not had any lightheadedness nor has he passed out.  Recent 30-day event monitor was notable for sinus pauses that occurred both during the day and night. He reports that he was awake during the daytime episodes, watching television. He has been taking his medications as prescribed; he has been off metoprolol since his hospitalization in January. He is tolerating amlodipine and losartan well.  --------------------------------------------------------------------------------------------------  Cardiovascular History & Procedures: Cardiovascular Problems:  Sinus arrest with junctional bradycardia in the setting of beta-blocker use  Stroke (2002)  Coronary artery disease  Risk Factors:  Known CAD, stroke, hypertension, male  gender, and age > 80  Cath/PCI:  LHC (04/01/16): Mild ectasia of the proximal LAD.  Distal LAD diffusely diseased with focal area of 90% stenosis.  Dominant LCx with diffuse ectasia and diffuse 50% stenosis of lPDA.  60% mid RCA lesion.  Normal LV contraction.  Mildly elevated LVEDP.  CV Surgery:  None  EP Procedures and Devices:  30 day event monitor (05/03/16): Patient was monitored for 30 days with predominately normal sinus rhythm (average rate 65 bpm (range 38-104 bpm). Sinus pauses with junctional escapes noted both at night and during the daytime (longest RR interval 3.8 seconds). Rare PACs noted.  Temporary transvenous pacemaker (04/01/16)  Non-Invasive Evaluation(s):  Exercise tolerance test (04/28/16): Patient exercised 5 minutes, 0 seconds (7 METS) achieving a maximal heart rate of 115 bpm (76% MPHR). No significant ST segment changes were noted. Test inconclusive for ischemia due to target heart rate not having been achieved.  Transthoracic echocardiogram (04/02/16): Normal LV size with moderate LVH.  LVEF 65-70% with normal wall motion.  Grade 1 diastolic dysfunction.  Mild aortic regurgitation.  Normal RV size and function.  Recent CV Pertinent Labs: Lab Results  Component Value Date   CHOL 102 04/02/2016   HDL 40 (L) 04/02/2016   LDLCALC 43 04/02/2016   TRIG 97 04/02/2016   CHOLHDL 2.6 04/02/2016   INR 0.99 04/01/2016   K 4.7 04/28/2016   MG 2.1 04/01/2016   BUN 7 (L) 04/28/2016   CREATININE 0.72 (L) 04/28/2016    Past medical and surgical history were reviewed and updated in EPIC.  Outpatient Encounter Prescriptions as of 06/10/2016  Medication Sig  . amLODipine (NORVASC) 10 MG tablet Take 10 mg by mouth daily.  Marland Kitchen atorvastatin (LIPITOR) 10 MG tablet Take 1 tablet (10 mg total) by mouth daily.  Marland Kitchen  carbamazepine (TEGRETOL) 200 MG tablet Take 200 mg by mouth 4 (four) times daily.   . citalopram (CELEXA) 40 MG tablet Take 40 mg by mouth daily.    . clorazepate  (TRANXENE-T) 3.75 MG tablet Take 3.75 mg by mouth 2 (two) times daily.    Marland Kitchen dipyridamole-aspirin (AGGRENOX) 200-25 MG 12hr capsule Take 2 capsules by mouth at bedtime.   . folic acid (FOLVITE) 1 MG tablet Take 1 mg by mouth daily.    Marland Kitchen gabapentin (NEURONTIN) 400 MG tablet Take 400 mg by mouth 4 (four) times daily.    Marland Kitchen losartan (COZAAR) 100 MG tablet Take 1 tablet (100 mg total) by mouth daily.  . nitroGLYCERIN (NITROSTAT) 0.4 MG SL tablet Place 1 tablet (0.4 mg total) under the tongue every 5 (five) minutes x 3 doses as needed for chest pain.   No facility-administered encounter medications on file as of 06/10/2016.     Allergies: Cyclosporine and Serzone [nefazodone hcl]  Social History   Social History  . Marital status: Married    Spouse name: N/A  . Number of children: N/A  . Years of education: N/A   Occupational History  . Not on file.   Social History Main Topics  . Smoking status: Never Smoker  . Smokeless tobacco: Never Used  . Alcohol use No  . Drug use: No  . Sexual activity: Not on file   Other Topics Concern  . Not on file   Social History Narrative  . No narrative on file    Family History  Problem Relation Age of Onset  . Heart attack Mother     in her 68's  . Heart disease Mother     skipped beats  . Cancer Father     liver    Review of Systems: A 12-system review of systems was performed and was negative except as noted in the HPI.  --------------------------------------------------------------------------------------------------  Physical Exam: BP 138/80   Pulse 69   Ht 5\' 4"  (1.626 m)   Wt 187 lb 4 oz (84.9 kg)   SpO2 96%   BMI 32.14 kg/m   General:  Overweight man, seated comfortably in the exam room.He is accompanied by his wife. HEENT: No conjunctival pallor or scleral icterus.  Moist mucous membranes.  OP clear. Neck: Supple without lymphadenopathy, thyromegaly, JVD, or HJR.  No carotid bruit. Lungs: Normal work of breathing.   Clear to auscultation bilaterally without wheezes or crackles. Heart: Regular rate and rhythm without murmurs, rubs, or gallops.  Non-displaced PMI. Abd: Bowel sounds present.  Soft, NT/ND without hepatosplenomegaly Ext: No lower extremity edema.  Radial, PT, and DP pulses are 2+ bilaterally. Skin: warm and dry without rash  Lab Results  Component Value Date   WBC 4.9 04/03/2016   HGB 13.6 04/03/2016   HCT 38.1 (L) 04/03/2016   MCV 87.6 04/03/2016   PLT 96 (L) 04/03/2016    Lab Results  Component Value Date   NA 132 (L) 04/28/2016   K 4.7 04/28/2016   CL 92 (L) 04/28/2016   CO2 24 04/28/2016   BUN 7 (L) 04/28/2016   CREATININE 0.72 (L) 04/28/2016   GLUCOSE 98 04/28/2016   ALT 18 04/01/2016    Lab Results  Component Value Date   CHOL 102 04/02/2016   HDL 40 (L) 04/02/2016   LDLCALC 43 04/02/2016   TRIG 97 04/02/2016   CHOLHDL 2.6 04/02/2016    --------------------------------------------------------------------------------------------------  ASSESSMENT AND PLAN: Sick sinus syndrome Patient continues to be quite fatigued  with random episodes of shortness of breath that can occur both at rest and with exertion. These seem to accompany irregular heartbeats and pauses. Recent event monitor was notable for sinus pauses and junctional escape with maximal R-R interval of 3.8 seconds. He is not on any common nodal blocking agents; I have a low suspicion that amlodipine is the culprit. We have discussed the nature of sinus node disease and have agreed for the patient to be seen in our office today by Dr. Rayann Heman for further evaluation and discussion of permanent pacemaker placement.  Hypertension Blood pressure upper normal today. Home readings have been somewhat elevated at times. Patient is tolerating increased dose of losartan well. We will continue with amlodipine and losartan at current doses and discuss further medication changes following EP evaluation.  Coronary artery  disease No further chest pain. Recent catheterization revealed apical LAD disease up to 90% and otherwise nonobstructive CAD. Exercise tolerance test was inconclusive due to inability to reach target heart rate. However, no ischemic changes were noted at submaximal exercise. We will continue with medical therapy, including aspirin and atorvastatin. Most recent LDL was 43 and 03/2016.  Follow-up: Return to clinic in 3 months.  Nelva Bush, MD 06/10/2016 3:51 PM

## 2016-06-12 ENCOUNTER — Encounter: Payer: Self-pay | Admitting: Internal Medicine

## 2016-06-12 NOTE — Progress Notes (Signed)
Electrophysiology Office Note   Date:  06/12/2016   ID:  Aaron Keller, DOB [birth year], MRN [id number]  PCP:  Gara Kroner, MD  Cardiologist:  Dr End Primary Electrophysiologist: Thompson Grayer, MD    CC: bradycardia   History of Present Illness: Aaron Keller is a 71 y.o. male who presents today for electrophysiology evaluation.   I have been asked by Dr End to see the patient today in consultation regarding symptomatic bradycardia.  I have discussed his case in detail with Dr End today.  The patient has CAD.   He has a preserved EF.  He previously was admitted with advanced bradycardia/ severe sick sinus syndrome and required temporary pacing due to a slow junctional rhythm.  After metoprolol washout, he was discharged with sinus bradycardia.  Unfortunately, he remains bradycardic off of beta blocker therapy.  He recently had a treadmill test which demonstrated chronotropic incompetence.  He also wore an event monitor which revealed daytime pauses of 3.5 seconds.  He reports being very symptomatic with the pauses.  His symptoms are transient breathlessness.  He also has chronic difficulty with fatigue and decreased exercise tolerance.  He snores but has not had a sleep study.  Today, he denies symptoms of palpitations, chest pain, orthopnea, PND, lower extremity edema, claudication, dizziness, presyncope, syncope, bleeding, or neurologic sequela. The patient is tolerating medications without difficulties and is otherwise without complaint today.    Past Medical History:  Diagnosis Date  . Allergy   . CAD in native artery- non obstructive on cath  04/04/2016  . Chronic leg pain   . Encephalitis   . HTN (hypertension) 04/04/2016  . Hx of tonic-clonic seizures 04/04/2016  . Hypertension   . Seizures ([place])   . Sick sinus syndrome ([place])   . Stroke [facility])    Past Surgical History:  Procedure Laterality Date  . APPENDECTOMY    . CARDIAC CATHETERIZATION N/A 04/01/2016   Procedure:  Left Heart Cath and Coronary Angiography;  Surgeon: Peter M Martinique, MD;  Location: [place];  Service: Cardiovascular;  Laterality: N/A;  . CARDIAC CATHETERIZATION N/A 04/01/2016   Procedure: Temporary Pacemaker;  Surgeon: Peter M Martinique, MD;  Location: [place] [facility];  Service: Cardiovascular;  Laterality: N/A;  . HERNIA REPAIR       Current Outpatient Prescriptions  Medication Sig Dispense Refill  . amLODipine (NORVASC) 10 MG tablet Take 10 mg by mouth daily.    Marland Kitchen atorvastatin (LIPITOR) 10 MG tablet Take 1 tablet (10 mg total) by mouth daily. 30 tablet 6  . carbamazepine (TEGRETOL) 200 MG tablet Take 200 mg by mouth 4 (four) times daily.     . citalopram (CELEXA) 40 MG tablet Take 40 mg by mouth daily.      . clorazepate (TRANXENE-T) 3.75 MG tablet Take 3.75 mg by mouth 2 (two) times daily.      Marland Kitchen dipyridamole-aspirin (AGGRENOX) 200-25 MG 12hr capsule Take 2 capsules by mouth at bedtime.     . folic acid (FOLVITE) 1 MG tablet Take 1 mg by mouth daily.      Marland Kitchen gabapentin (NEURONTIN) 400 MG tablet Take 400 mg by mouth 4 (four) times daily.      Marland Kitchen losartan (COZAAR) 100 MG tablet Take 1 tablet (100 mg total) by mouth daily. 90 tablet 1  . nitroGLYCERIN (NITROSTAT) 0.4 MG SL tablet Place 1 tablet (0.4 mg total) under the tongue every 5 (five) minutes x 3 doses as needed for chest pain.  25 tablet 4   No current facility-administered medications for this visit.     Allergies:   Cyclosporine and Serzone [nefazodone hcl]   Social History:  The patient  reports that he has never smoked. He has never used smokeless tobacco. He reports that he does not drink alcohol or use drugs.   Family History:  The patient's  family history includes Cancer in his father; Heart attack in his mother; Heart disease in his mother.    ROS:  Please see the history of present illness.   All other systems are personally reviewed and negative.    PHYSICAL EXAM: VS:  BP 138/80   Pulse 69   Ht 5\' 4"   (1.626 m)   Wt 187 lb (84.8 kg)   SpO2 96%   BMI 32.10 kg/m  , BMI Body mass index is 32.1 kg/m. GEN: Well nourished, well developed, in no acute distress  HEENT: normal  Neck: no JVD, carotid bruits, or masses Cardiac: RRR; no murmurs, rubs, or gallops,no edema  Respiratory:  clear to auscultation bilaterally, normal work of breathing GI: soft, nontender, nondistended, + BS MS: no deformity or atrophy  Skin: warm and dry  Neuro:  Strength and sensation are intact Psych: euthymic mood, full affect  EKG:  EKG from 04/14/16 is reviewed.  Reveals sinus rhythm at 4 bpm, LAHB Multiple other prior ecgs are also reviewed   Recent Labs: 04/01/2016: ALT 18; Magnesium 2.1; TSH 3.644 04/03/2016: Hemoglobin 13.6; Platelets 96 04/28/2016: BUN 7; Creatinine, Ser 0.72; Potassium 4.7; Sodium 132  personally reviewed   Lipid Panel     Component Value Date/Time   CHOL 102 04/02/2016 0500   TRIG 97 04/02/2016 0500   HDL 40 (L) 04/02/2016 0500   CHOLHDL 2.6 04/02/2016 0500   VLDL 19 04/02/2016 0500   LDLCALC 43 04/02/2016 0500   personally reviewed   Wt Readings from Last 3 Encounters:  06/10/16 187 lb (84.8 kg)  06/10/16 187 lb 4 oz (84.9 kg)  04/14/16 186 lb (84.4 kg)      Other studies personally reviewed: Additional studies/ records that were reviewed today include: prior echo, prior holter monitor  Review of the above records today demonstrates: as above   ASSESSMENT AND PLAN:  1.  Sick sinus syndrome The patient has symptomatic sinus bradycardia.  No reversible causes have been found. I would therefore recommend pacemaker implantation at this time.  Risks, benefits, alternatives to pacemaker implantation were discussed in detail with the patient today. The patient understands that the risks include but are not limited to bleeding, infection, pneumothorax, perforation, tamponade, vascular damage, renal failure, MI, stroke, death,  and lead dislodgement and wishes to proceed. We  will therefore schedule the procedure at the next available time.  2. Fatigue/ snoring Sleep study is strongly advised He is an Animal nutritionist patient and requests referral to Dr Maxwell Caul for sleep evaluation. I do think that he would greatly benefit from sleep study.  3. CAD No ischemic symptoms Medical therapy has been advised however his beta blocker has been discontinued due to bradycardia. No changes today Hopefully could restart metoprolol after PPM is in plce  4. HTN Stable No change required today  Current medicines are reviewed at length with the patient today.   The patient does not have concerns regarding his medicines.  The following changes were made today:  none  Labs/ tests ordered today include:  Orders Placed This Encounter  Procedures  . Ambulatory referral to Sleep Studies  Very complicated patient.  He has sick sinus syndrome and is at risk for decompensation/ syncope.  A high level of decision making is requiring including discussion with Dr End and same day add on consultation.  I will proceed with PPM at the next available time (early next week).  Army Fossa, MD  06/12/2016 9:23 PM     [address] [place] [address] [phone] (office) [phone] (fax)

## 2016-06-14 ENCOUNTER — Other Ambulatory Visit: Payer: BLUE CROSS/BLUE SHIELD

## 2016-06-20 ENCOUNTER — Telehealth: Payer: Self-pay | Admitting: Internal Medicine

## 2016-06-20 NOTE — Telephone Encounter (Signed)
New message      Pt c/o medication issue:  1. Name of Medication: aggrenox 2. How are you currently taking this medication (dosage and times per day)? 200-25 3. Are you having a reaction (difficulty breathing--STAT)?    4. What is your medication issue?  Pt is scheduled to have a pacemaker put in on the 10th.  Wife want to know if he should stop this medication prior to appt.  Please call

## 2016-06-21 NOTE — Telephone Encounter (Signed)
Informed wife (DPR on file), per Dr. Lovena Le, pt should stop Aggrenox 48 hours prior to PPM implant.  Take last  dose on 4/7 and they will instruct pt when to restart after procedure.  She verbalized understanding and agreeable to plan.  (explained Dr. Rayann Heman is out of the office this week)

## 2016-06-28 ENCOUNTER — Encounter (HOSPITAL_COMMUNITY)
Admission: RE | Disposition: A | Discharge status: Home / Self Care | Payer: Self-pay | Source: Ambulatory Visit | Attending: Internal Medicine

## 2016-06-28 ENCOUNTER — Ambulatory Visit (HOSPITAL_COMMUNITY)
Admission: RE | Admit: 2016-06-28 | Discharge: 2016-06-29 | Disposition: A | Discharge status: Home / Self Care | Payer: BLUE CROSS/BLUE SHIELD | Source: Ambulatory Visit | Attending: Internal Medicine | Admitting: Internal Medicine

## 2016-06-28 DIAGNOSIS — I10 Essential (primary) hypertension: Secondary | ICD-10-CM | POA: Insufficient documentation

## 2016-06-28 DIAGNOSIS — Z8673 Personal history of transient ischemic attack (TIA), and cerebral infarction without residual deficits: Secondary | ICD-10-CM | POA: Insufficient documentation

## 2016-06-28 DIAGNOSIS — I2510 Atherosclerotic heart disease of native coronary artery without angina pectoris: Secondary | ICD-10-CM | POA: Diagnosis not present

## 2016-06-28 DIAGNOSIS — G8929 Other chronic pain: Secondary | ICD-10-CM | POA: Diagnosis not present

## 2016-06-28 DIAGNOSIS — I495 Sick sinus syndrome: Secondary | ICD-10-CM | POA: Insufficient documentation

## 2016-06-28 DIAGNOSIS — Z7982 Long term (current) use of aspirin: Secondary | ICD-10-CM | POA: Insufficient documentation

## 2016-06-28 DIAGNOSIS — Z8249 Family history of ischemic heart disease and other diseases of the circulatory system: Secondary | ICD-10-CM | POA: Diagnosis not present

## 2016-06-28 DIAGNOSIS — Z959 Presence of cardiac and vascular implant and graft, unspecified: Secondary | ICD-10-CM

## 2016-06-28 DIAGNOSIS — G40409 Other generalized epilepsy and epileptic syndromes, not intractable, without status epilepticus: Secondary | ICD-10-CM | POA: Insufficient documentation

## 2016-06-28 DIAGNOSIS — M79606 Pain in leg, unspecified: Secondary | ICD-10-CM | POA: Diagnosis not present

## 2016-06-28 HISTORY — DX: Inflammatory liver disease, unspecified: K75.9

## 2016-06-28 HISTORY — PX: PACEMAKER IMPLANT: EP1218

## 2016-06-28 HISTORY — DX: Presence of cardiac pacemaker: Z95.0

## 2016-06-28 HISTORY — DX: Epistaxis: R04.0

## 2016-06-28 HISTORY — DX: Other seasonal allergic rhinitis: J30.2

## 2016-06-28 LAB — BASIC METABOLIC PANEL
Anion gap: 10 (ref 5–15)
BUN: 8 mg/dL (ref 6–20)
CALCIUM: 8.7 mg/dL — AB (ref 8.9–10.3)
CO2: 27 mmol/L (ref 22–32)
Chloride: 98 mmol/L — ABNORMAL LOW (ref 101–111)
Creatinine, Ser: 0.7 mg/dL (ref 0.61–1.24)
GFR calc Af Amer: >60 mL/min (ref >60)
GFR calc non Af Amer: >60 mL/min (ref >60)
GLUCOSE: 94 mg/dL (ref 65–99)
Potassium: 4.4 mmol/L (ref 3.5–5.1)
Sodium: 135 mmol/L (ref 135–145)

## 2016-06-28 LAB — CBC
HCT: 39.1 % (ref 39.0–52.0)
Hemoglobin: 14.1 g/dL (ref 13.0–17.0)
MCH: 31.1 pg (ref 26.0–34.0)
MCHC: 36.1 g/dL — ABNORMAL HIGH (ref 30.0–36.0)
MCV: 86.3 fL (ref 78.0–100.0)
PLATELETS: 124 10*3/uL — AB (ref 150–400)
RBC: 4.53 MIL/uL (ref 4.22–5.81)
RDW: 11.7 % (ref 11.5–15.5)
WBC: 5.4 10*3/uL (ref 4.0–10.5)

## 2016-06-28 LAB — SURGICAL PCR SCREEN
MRSA, PCR: NEGATIVE
Staphylococcus aureus: POSITIVE — AB

## 2016-06-28 SURGERY — PACEMAKER IMPLANT
Anesthesia: LOCAL

## 2016-06-28 MED ORDER — MUPIROCIN 2 % EX OINT
TOPICAL_OINTMENT | CUTANEOUS | Status: AC
  Administered 2016-06-28: 1 via TOPICAL
  Filled 2016-06-28: qty 22

## 2016-06-28 MED ORDER — SODIUM CHLORIDE 0.9 % IV SOLN: 250.0000 mL | INTRAVENOUS | Status: DC | PRN

## 2016-06-28 MED ORDER — CARBAMAZEPINE 200 MG PO TABS
200.0000 mg | ORAL_TABLET | Freq: Four times a day (QID) | ORAL | Status: DC
  Administered 2016-06-28 – 2016-06-29 (×2): 200 mg via ORAL
  Filled 2016-06-28 (×3): qty 1

## 2016-06-28 MED ORDER — CHLORHEXIDINE GLUCONATE 4 % EX LIQD: 60.0000 mL | Freq: Once | CUTANEOUS | Status: DC

## 2016-06-28 MED ORDER — LIDOCAINE HCL (PF) 1 % IJ SOLN
INTRAMUSCULAR | Status: AC
  Filled 2016-06-28: qty 30

## 2016-06-28 MED ORDER — SODIUM CHLORIDE 0.9 % IR SOLN
80.0000 mg | Status: AC
  Administered 2016-06-28: 80 mg

## 2016-06-28 MED ORDER — AMLODIPINE BESYLATE 10 MG PO TABS
10.0000 mg | ORAL_TABLET | Freq: Every day | ORAL | Status: DC
Start: 2016-06-29 — End: 2016-06-29
  Administered 2016-06-29: 09:00:00 10 mg via ORAL
  Filled 2016-06-28: qty 1

## 2016-06-28 MED ORDER — LIDOCAINE HCL (PF) 1 % IJ SOLN
INTRAMUSCULAR | Status: DC | PRN
  Administered 2016-06-28: 55 mL

## 2016-06-28 MED ORDER — ONDANSETRON HCL 4 MG/2ML IJ SOLN: 4.0000 mg | Freq: Four times a day (QID) | INTRAMUSCULAR | Status: DC | PRN

## 2016-06-28 MED ORDER — MIDAZOLAM HCL 5 MG/5ML IJ SOLN
INTRAMUSCULAR | Status: DC | PRN
  Administered 2016-06-28 (×2): 1 mg via INTRAVENOUS

## 2016-06-28 MED ORDER — ACETAMINOPHEN 325 MG PO TABS: 325.0000 mg | ORAL_TABLET | ORAL | Status: DC | PRN

## 2016-06-28 MED ORDER — FENTANYL CITRATE (PF) 100 MCG/2ML IJ SOLN
INTRAMUSCULAR | Status: AC
  Filled 2016-06-28: qty 2

## 2016-06-28 MED ORDER — HEPARIN (PORCINE) IN NACL 2-0.9 UNIT/ML-% IJ SOLN
INTRAMUSCULAR | Status: AC
  Filled 2016-06-28: qty 500

## 2016-06-28 MED ORDER — FLUTICASONE PROPIONATE 50 MCG/ACT NA SUSP
2.0000 | Freq: Every evening | NASAL | Status: DC | PRN
  Filled 2016-06-28: qty 16

## 2016-06-28 MED ORDER — CEFAZOLIN SODIUM-DEXTROSE 2-4 GM/100ML-% IV SOLN
INTRAVENOUS | Status: AC
  Filled 2016-06-28: qty 100

## 2016-06-28 MED ORDER — GABAPENTIN 800 MG PO TABS
400.0000 mg | ORAL_TABLET | Freq: Four times a day (QID) | ORAL | Status: DC
  Administered 2016-06-28: 21:00:00 400 mg via ORAL
  Filled 2016-06-28 (×2): qty 0.5

## 2016-06-28 MED ORDER — SODIUM CHLORIDE 0.9 % IV SOLN
INTRAVENOUS | Status: DC
  Administered 2016-06-28: 15:00:00 via INTRAVENOUS

## 2016-06-28 MED ORDER — HEPARIN (PORCINE) IN NACL 2-0.9 UNIT/ML-% IJ SOLN
INTRAMUSCULAR | Status: DC | PRN
  Administered 2016-06-28: 1000 mL

## 2016-06-28 MED ORDER — SODIUM CHLORIDE 0.9 % IR SOLN
Status: AC
  Filled 2016-06-28: qty 2

## 2016-06-28 MED ORDER — CEFAZOLIN SODIUM-DEXTROSE 2-4 GM/100ML-% IV SOLN
2.0000 g | INTRAVENOUS | Status: AC
  Administered 2016-06-28: 2 g via INTRAVENOUS

## 2016-06-28 MED ORDER — IOPAMIDOL (ISOVUE-370) INJECTION 76%
INTRAVENOUS | Status: DC | PRN
  Administered 2016-06-28: 15 mL via INTRAVENOUS

## 2016-06-28 MED ORDER — SODIUM CHLORIDE 0.9% FLUSH
3.0000 mL | Freq: Two times a day (BID) | INTRAVENOUS | Status: DC
  Administered 2016-06-28 – 2016-06-29 (×2): 3 mL via INTRAVENOUS

## 2016-06-28 MED ORDER — MUPIROCIN 2 % EX OINT
1.0000 "application " | TOPICAL_OINTMENT | Freq: Once | CUTANEOUS | Status: AC
  Administered 2016-06-28: 1 via TOPICAL

## 2016-06-28 MED ORDER — CLORAZEPATE DIPOTASSIUM 3.75 MG PO TABS
3.7500 mg | ORAL_TABLET | Freq: Two times a day (BID) | ORAL | Status: DC
  Administered 2016-06-28 – 2016-06-29 (×2): 3.75 mg via ORAL
  Filled 2016-06-28 (×2): qty 1

## 2016-06-28 MED ORDER — LOSARTAN POTASSIUM 50 MG PO TABS
100.0000 mg | ORAL_TABLET | Freq: Every day | ORAL | Status: DC
  Administered 2016-06-29: 09:00:00 100 mg via ORAL
  Filled 2016-06-28: qty 2

## 2016-06-28 MED ORDER — SODIUM CHLORIDE 0.9% FLUSH: 3.0000 mL | INTRAVENOUS | Status: DC | PRN

## 2016-06-28 MED ORDER — FENTANYL CITRATE (PF) 100 MCG/2ML IJ SOLN
INTRAMUSCULAR | Status: DC | PRN
  Administered 2016-06-28 (×2): 12.5 ug via INTRAVENOUS

## 2016-06-28 MED ORDER — HYDROCODONE-ACETAMINOPHEN 5-325 MG PO TABS
1.0000 | ORAL_TABLET | ORAL | Status: DC | PRN
  Administered 2016-06-29: 2 via ORAL
  Filled 2016-06-28: qty 2

## 2016-06-28 MED ORDER — YOU HAVE A PACEMAKER BOOK
Freq: Once | Status: AC
  Administered 2016-06-28: 23:00:00
  Filled 2016-06-28: qty 1

## 2016-06-28 MED ORDER — CEFAZOLIN IN D5W 1 GM/50ML IV SOLN
1.0000 g | Freq: Four times a day (QID) | INTRAVENOUS | Status: AC
  Administered 2016-06-28 – 2016-06-29 (×3): 1 g via INTRAVENOUS
  Filled 2016-06-28 (×3): qty 50

## 2016-06-28 MED ORDER — CITALOPRAM HYDROBROMIDE 40 MG PO TABS
40.0000 mg | ORAL_TABLET | Freq: Every day | ORAL | Status: DC
Start: 2016-06-29 — End: 2016-06-29
  Administered 2016-06-29: 09:00:00 40 mg via ORAL
  Filled 2016-06-28: qty 1
  Filled 2016-06-28: qty 2

## 2016-06-28 MED ORDER — MIDAZOLAM HCL 5 MG/5ML IJ SOLN
INTRAMUSCULAR | Status: AC
  Filled 2016-06-28: qty 5

## 2016-06-28 SURGICAL SUPPLY — 7 items
CABLE SURGICAL S-101-97-12 (CABLE) ×2 IMPLANT
LEAD TENDRIL MRI 46CM LPA1200M (Lead) ×2 IMPLANT
LEAD TENDRIL MRI 58CM LPA1200M (Lead) ×2 IMPLANT
PACEMAKER ASSURITY DR-RF (Pacemaker) ×2 IMPLANT
PAD DEFIB LIFELINK (PAD) ×2 IMPLANT
SHEATH CLASSIC 8F (SHEATH) ×4 IMPLANT
TRAY PACEMAKER INSERTION (PACKS) ×2 IMPLANT

## 2016-06-28 NOTE — H&P (View-Only) (Signed)
Electrophysiology Office Note   Date:  06/12/2016   ID:  Aaron Keller, DOB [birth year], MRN [id number]  PCP:  Gara Kroner, MD  Cardiologist:  Dr End Primary Electrophysiologist: Thompson Grayer, MD    CC: bradycardia   History of Present Illness: Aaron Keller is a 71 y.o. male who presents today for electrophysiology evaluation.   I have been asked by Dr End to see the patient today in consultation regarding symptomatic bradycardia.  I have discussed his case in detail with Dr End today.  The patient has CAD.   He has a preserved EF.  He previously was admitted with advanced bradycardia/ severe sick sinus syndrome and required temporary pacing due to a slow junctional rhythm.  After metoprolol washout, he was discharged with sinus bradycardia.  Unfortunately, he remains bradycardic off of beta blocker therapy.  He recently had a treadmill test which demonstrated chronotropic incompetence.  He also wore an event monitor which revealed daytime pauses of 3.5 seconds.  He reports being very symptomatic with the pauses.  His symptoms are transient breathlessness.  He also has chronic difficulty with fatigue and decreased exercise tolerance.  He snores but has not had a sleep study.  Today, he denies symptoms of palpitations, chest pain, orthopnea, PND, lower extremity edema, claudication, dizziness, presyncope, syncope, bleeding, or neurologic sequela. The patient is tolerating medications without difficulties and is otherwise without complaint today.    Past Medical History:  Diagnosis Date  . Allergy   . CAD in native artery- non obstructive on cath  04/04/2016  . Chronic leg pain   . Encephalitis   . HTN (hypertension) 04/04/2016  . Hx of tonic-clonic seizures 04/04/2016  . Hypertension   . Seizures ([place])   . Sick sinus syndrome ([place])   . Stroke [facility])    Past Surgical History:  Procedure Laterality Date  . APPENDECTOMY    . CARDIAC CATHETERIZATION N/A 04/01/2016   Procedure:  Left Heart Cath and Coronary Angiography;  Surgeon: Peter M Martinique, MD;  Location: [facility];  Service: Cardiovascular;  Laterality: N/A;  . CARDIAC CATHETERIZATION N/A 04/01/2016   Procedure: Temporary Pacemaker;  Surgeon: Peter M Martinique, MD;  Location: [facility];  Service: Cardiovascular;  Laterality: N/A;  . HERNIA REPAIR       Current Outpatient Prescriptions  Medication Sig Dispense Refill  . amLODipine (NORVASC) 10 MG tablet Take 10 mg by mouth daily.    Marland Kitchen atorvastatin (LIPITOR) 10 MG tablet Take 1 tablet (10 mg total) by mouth daily. 30 tablet 6  . carbamazepine (TEGRETOL) 200 MG tablet Take 200 mg by mouth 4 (four) times daily.     . citalopram (CELEXA) 40 MG tablet Take 40 mg by mouth daily.      . clorazepate (TRANXENE-T) 3.75 MG tablet Take 3.75 mg by mouth 2 (two) times daily.      Marland Kitchen dipyridamole-aspirin (AGGRENOX) 200-25 MG 12hr capsule Take 2 capsules by mouth at bedtime.     . folic acid (FOLVITE) 1 MG tablet Take 1 mg by mouth daily.      Marland Kitchen gabapentin (NEURONTIN) 400 MG tablet Take 400 mg by mouth 4 (four) times daily.      Marland Kitchen losartan (COZAAR) 100 MG tablet Take 1 tablet (100 mg total) by mouth daily. 90 tablet 1  . nitroGLYCERIN (NITROSTAT) 0.4 MG SL tablet Place 1 tablet (0.4 mg total) under the tongue every 5 (five) minutes x 3 doses as needed for chest pain.  25 tablet 4   No current facility-administered medications for this visit.     Allergies:   Cyclosporine and Serzone [nefazodone hcl]   Social History:  The patient  reports that he has never smoked. He has never used smokeless tobacco. He reports that he does not drink alcohol or use drugs.   Family History:  The patient's  family history includes Cancer in his father; Heart attack in his mother; Heart disease in his mother.    ROS:  Please see the history of present illness.   All other systems are personally reviewed and negative.    PHYSICAL EXAM: VS:  BP 138/80   Pulse 69   Ht 5\' 4"   (1.626 m)   Wt 187 lb (84.8 kg)   SpO2 96%   BMI 32.10 kg/m  , BMI Body mass index is 32.1 kg/m. GEN: Well nourished, well developed, in no acute distress  HEENT: normal  Neck: no JVD, carotid bruits, or masses Cardiac: RRR; no murmurs, rubs, or gallops,no edema  Respiratory:  clear to auscultation bilaterally, normal work of breathing GI: soft, nontender, nondistended, + BS MS: no deformity or atrophy  Skin: warm and dry  Neuro:  Strength and sensation are intact Psych: euthymic mood, full affect  EKG:  EKG from 04/14/16 is reviewed.  Reveals sinus rhythm at 4 bpm, LAHB Multiple other prior ecgs are also reviewed   Recent Labs: 04/01/2016: ALT 18; Magnesium 2.1; TSH 3.644 04/03/2016: Hemoglobin 13.6; Platelets 96 04/28/2016: BUN 7; Creatinine, Ser 0.72; Potassium 4.7; Sodium 132  personally reviewed   Lipid Panel     Component Value Date/Time   CHOL 102 04/02/2016 0500   TRIG 97 04/02/2016 0500   HDL 40 (L) 04/02/2016 0500   CHOLHDL 2.6 04/02/2016 0500   VLDL 19 04/02/2016 0500   LDLCALC 43 04/02/2016 0500   personally reviewed   Wt Readings from Last 3 Encounters:  06/10/16 187 lb (84.8 kg)  06/10/16 187 lb 4 oz (84.9 kg)  04/14/16 186 lb (84.4 kg)      Other studies personally reviewed: Additional studies/ records that were reviewed today include: prior echo, prior holter monitor  Review of the above records today demonstrates: as above   ASSESSMENT AND PLAN:  1.  Sick sinus syndrome The patient has symptomatic sinus bradycardia.  No reversible causes have been found. I would therefore recommend pacemaker implantation at this time.  Risks, benefits, alternatives to pacemaker implantation were discussed in detail with the patient today. The patient understands that the risks include but are not limited to bleeding, infection, pneumothorax, perforation, tamponade, vascular damage, renal failure, MI, stroke, death,  and lead dislodgement and wishes to proceed. We  will therefore schedule the procedure at the next available time.  2. Fatigue/ snoring Sleep study is strongly advised He is an Animal nutritionist patient and requests referral to Dr Maxwell Caul for sleep evaluation. I do think that he would greatly benefit from sleep study.  3. CAD No ischemic symptoms Medical therapy has been advised however his beta blocker has been discontinued due to bradycardia. No changes today Hopefully could restart metoprolol after PPM is in plce  4. HTN Stable No change required today  Current medicines are reviewed at length with the patient today.   The patient does not have concerns regarding his medicines.  The following changes were made today:  none  Labs/ tests ordered today include:  Orders Placed This Encounter  Procedures  . Ambulatory referral to Sleep Studies  Very complicated patient.  He has sick sinus syndrome and is at risk for decompensation/ syncope.  A high level of decision making is requiring including discussion with Dr End and same day add on consultation.  I will proceed with PPM at the next available time (early next week).  Army Fossa, MD  06/12/2016 9:23 PM     [address] [place] [address] [phone] (office) [phone] (fax)

## 2016-06-28 NOTE — Interval H&P Note (Signed)
History and Physical Interval Note:  06/28/2016 2:44 PM  Aaron Keller  has presented today for surgery, with the diagnosis of sinus node dysfunction  The various methods of treatment have been discussed with the patient and family. After consideration of risks, benefits and other options for treatment, the patient has consented to  Procedure(s): Pacemaker Implant (N/A) as a surgical intervention .  The patient's history has been reviewed, patient examined, no change in status, stable for surgery.  I have reviewed the patient's chart and labs.  Questions were answered to the patient's satisfaction.     Thompson Grayer

## 2016-06-28 NOTE — Care Management Note (Signed)
Case Management Note  Patient Details  Name: BACILIO ABASCAL MRN: [id number] Date of Birth: [birth year]  Subjective/Objective:    s/p Pace maker implant, NCm will cont to follow for dc needs.                Action/Plan:   Expected Discharge Date:                  Expected Discharge Plan:  Home/Self Care  In-House Referral:     Discharge planning Services  CM Consult  Post Acute Care Choice:    Choice offered to:     DME Arranged:    DME Agency:     HH Arranged:    HH Agency:     Status of Service:  In process, will continue to follow  If discussed at Long Length of Stay Meetings, dates discussed:    Additional Comments:  Zenon Mayo, RN 06/28/2016, 8:33 PM

## 2016-06-29 ENCOUNTER — Ambulatory Visit (HOSPITAL_COMMUNITY): Payer: BLUE CROSS/BLUE SHIELD

## 2016-06-29 ENCOUNTER — Encounter (HOSPITAL_COMMUNITY): Payer: Self-pay | Admitting: General Practice

## 2016-06-29 DIAGNOSIS — Z8673 Personal history of transient ischemic attack (TIA), and cerebral infarction without residual deficits: Secondary | ICD-10-CM | POA: Diagnosis not present

## 2016-06-29 DIAGNOSIS — I495 Sick sinus syndrome: Secondary | ICD-10-CM | POA: Diagnosis not present

## 2016-06-29 DIAGNOSIS — I2510 Atherosclerotic heart disease of native coronary artery without angina pectoris: Secondary | ICD-10-CM | POA: Diagnosis not present

## 2016-06-29 DIAGNOSIS — I10 Essential (primary) hypertension: Secondary | ICD-10-CM | POA: Diagnosis not present

## 2016-06-29 MED ORDER — GABAPENTIN 400 MG PO CAPS
400.0000 mg | ORAL_CAPSULE | Freq: Four times a day (QID) | ORAL | Status: DC
  Administered 2016-06-29: 09:00:00 400 mg via ORAL
  Filled 2016-06-29: qty 1

## 2016-06-29 MED ORDER — HYDROCHLOROTHIAZIDE 12.5 MG PO CAPS: 12.5000 mg | ORAL_CAPSULE | Freq: Every day | ORAL | 1 refills | Status: DC

## 2016-06-29 MED ORDER — HYDROCHLOROTHIAZIDE 12.5 MG PO CAPS
12.5000 mg | ORAL_CAPSULE | Freq: Every day | ORAL | Status: DC
  Administered 2016-06-29: 09:00:00 12.5 mg via ORAL
  Filled 2016-06-29: qty 1

## 2016-06-29 NOTE — Discharge Summary (Signed)
ELECTROPHYSIOLOGY PROCEDURE DISCHARGE SUMMARY    Patient ID: Aaron Keller,  MRN: [id number], DOB/AGE: May 20, 1945 71 y.o.  Admit date: 06/28/2016 Discharge date: 06/29/2016  Primary Care Physician: Gara Kroner, MD Primary Cardiologist: End Electrophysiologist: Toshika Parrow  Primary Discharge Diagnosis:  Symptomatic bradycardia status post pacemaker implantation this admission  Secondary Discharge Diagnosis:  1.  CAD 2.  Hypertension 3.  Prior stroke 4.  Sleep disordered breathing  Allergies  Allergen Reactions  . Cyclosporine Other (See Comments)    Unknown  . Serzone [Nefazodone Hcl] Other (See Comments)    Unknown     Procedures This Admission:  1.  Implantation of a STJ dual chamber PPM on 06/28/16 by Dr Rayann Heman.  See op note for full details. There were no immediate post procedure complications. 2.  CXR on 06/29/16 demonstrated no pneumothorax status post device implantation.   Brief HPI: Aaron Keller is a 71 y.o. male was referred to electrophysiology in the outpatient setting for consideration of PPM implantation.  Past medical history includes CAD, sleep disordered breathing and sinus node dysfunction.  The patient has had symptomatic bradycardia without reversible causes identified.  Risks, benefits, and alternatives to PPM implantation were reviewed with the patient who wished to proceed.   Hospital Course:  The patient was admitted and underwent implantation of a STJ MRI compatible dual chamber PPM with details as outlined above.  He  was monitored on telemetry overnight which demonstrated atrial pacing with intrinsic ventricular conduction.  Left chest was without hematoma or ecchymosis.  The device was interrogated and found to be functioning normally.  CXR was obtained and demonstrated no pneumothorax status post device implantation.  Wound care, arm mobility, and restrictions were reviewed with the patient.  The patient was examined and considered stable  for discharge to home.   HCTZ was added at discharge for blood pressure control. I have also called Dr Nancee Liter office to arrange for OSA evaluation as an outpatient.    Physical Exam: Vitals:   06/28/16 2018 06/28/16 2100 06/29/16 0615 06/29/16 0825  BP: (!) 177/100 (!) 169/89 (!) 179/86 (!) 146/79  Pulse: 74 72 70 66  Resp: 18 14 18 18   Temp: 97.6 F (36.4 C)  98 F (36.7 C) 98 F (36.7 C)  TempSrc: Oral  Oral Oral  SpO2: 97% 95% 96% 95%  Weight:   186 lb 15.2 oz (84.8 kg)   Height:        GEN- The patient is well appearing, alert and oriented x 3 today.   HEENT: normocephalic, atraumatic; sclera clear, conjunctiva pink; hearing intact; oropharynx clear; neck supple  Lungs- Clear to ausculation bilaterally, normal work of breathing.  No wheezes, rales, rhonchi Heart- Regular rate and rhythm, no murmurs, rubs or gallops  GI- soft, non-tender, non-distended, bowel sounds present  Extremities- no clubbing, cyanosis, or edema; DP/PT/radial pulses 2+ bilaterally MS- no significant deformity or atrophy Skin- warm and dry, no rash or lesion, left chest without hematoma/ecchymosis Psych- euthymic mood, full affect Neuro- strength and sensation are intact   Labs:   Lab Results  Component Value Date   WBC 5.4 06/28/2016   HGB 14.1 06/28/2016   HCT 39.1 06/28/2016   MCV 86.3 06/28/2016   PLT 124 (L) 06/28/2016     Recent Labs Lab 06/28/16 1409  NA 135  K 4.4  CL 98*  CO2 27  BUN 8  CREATININE 0.70  CALCIUM 8.7*  GLUCOSE 94    Discharge Medications:  Allergies  as of 06/29/2016      Reactions   Cyclosporine Other (See Comments)   Unknown   Serzone [nefazodone Hcl] Other (See Comments)   Unknown      Medication List    TAKE these medications   amLODipine 10 MG tablet Commonly known as:  NORVASC Take 10 mg by mouth daily.   atorvastatin 10 MG tablet Commonly known as:  LIPITOR Take 1 tablet (10 mg total) by mouth daily.   carbamazepine 200 MG  tablet Commonly known as:  TEGRETOL Take 200 mg by mouth 4 (four) times daily.   citalopram 40 MG tablet Commonly known as:  CELEXA Take 40 mg by mouth daily.   dipyridamole-aspirin 200-25 MG 12hr capsule Commonly known as:  AGGRENOX Take 2 capsules by mouth at bedtime.   fluticasone 50 MCG/ACT nasal spray Commonly known as:  FLONASE Place 2 sprays into both nostrils at bedtime as needed for allergies or rhinitis.   folic acid 1 MG tablet Commonly known as:  FOLVITE Take 1 mg by mouth daily.   gabapentin 400 MG tablet Commonly known as:  NEURONTIN Take 400 mg by mouth 4 (four) times daily.   hydrochlorothiazide 12.5 MG capsule Commonly known as:  MICROZIDE Take 1 capsule (12.5 mg total) by mouth daily.   losartan 100 MG tablet Commonly known as:  COZAAR Take 1 tablet (100 mg total) by mouth daily.   nitroGLYCERIN 0.4 MG SL tablet Commonly known as:  NITROSTAT Place 1 tablet (0.4 mg total) under the tongue every 5 (five) minutes x 3 doses as needed for chest pain.   TRANXENE-T 3.75 MG tablet Generic drug:  clorazepate Take 3.75 mg by mouth 2 (two) times daily.       Disposition:   Follow-up Information    [facility] Follow up on 07/13/2016.   Specialty:  Cardiology Why:  at [facility] for wound check  Contact information: [address] [place]       Nelva Bush, MD Follow up on 09/09/2016.   Specialty:  Cardiology Why:  at [facility] information: [place] [address] [phone]        Thompson Grayer, MD Follow up on 10/10/2016.   Specialty:  Cardiology Why:  at [facility] information: [place] [address] [phone]           Duration of Discharge Encounter: Greater than 30 minutes including physician time.  Signed, Chanetta Marshall, NP 06/29/2016 9:21 AM  Trude Mcburney

## 2016-06-29 NOTE — Progress Notes (Signed)
Doing well s/p PPM Device interrogation is reviewed and normal CXR reveals stable leads, no ptx  Will add hctz 12.5mg  daily for BP management He should follow-up with PCP for BP control and bmet  Routine wound care and follow-up  Thompson Grayer MD, [facility] 06/29/2016 8:05 AM

## 2016-06-29 NOTE — Discharge Instructions (Signed)
° ° °  Supplemental Discharge Instructions for  Pacemaker/Defibrillator Patients  Activity No heavy lifting or vigorous activity with your left/right arm for 6 to 8 weeks.  Do not raise your left/right arm above your head for one week.  Gradually raise your affected arm as drawn below.           __         07/03/16                        07/04/16                   07/05/16                       07/06/16  NO DRIVING for  1 week   ; you may begin driving on  7/51/70   .  WOUND CARE - Keep the wound area clean and dry.  Do not get this area wet for one week. No showers for one week; you may shower on   07/06/16  . - The tape/steri-strips on your wound will fall off; do not pull them off.  No bandage is needed on the site.  DO  NOT apply any creams, oils, or ointments to the wound area. - If you notice any drainage or discharge from the wound, any swelling or bruising at the site, or you develop a fever > 101? F after you are discharged home, call the office at once.  Special Instructions - You are still able to use cellular telephones; use the ear opposite the side where you have your pacemaker/defibrillator.  Avoid carrying your cellular phone near your device. - When traveling through airports, show security personnel your identification card to avoid being screened in the metal detectors.  Ask the security personnel to use the hand wand. - Avoid arc welding equipment, TENS units (transcutaneous nerve stimulators).  Call the office for questions about other devices. - Avoid electrical appliances that are in poor condition or are not properly grounded. - Microwave ovens are safe to be near or to operate.

## 2016-07-04 ENCOUNTER — Telehealth: Payer: Self-pay | Admitting: Internal Medicine

## 2016-07-04 NOTE — Telephone Encounter (Signed)
Patient wife calling states that patient had a device installed last week. Patient's right arm, in area where the BP Cuff was used is now red and broken out. Patient wife states that it has been burning and itching and she tried to treat it with benedryl but it is not working and she doesn't know what else to do. Please call, patient wife can be reached at mobile num [phone] and after that please call home number at [phone].

## 2016-07-04 NOTE — Telephone Encounter (Signed)
Spoke with patient's wife and have asked that she try PO Benadryl and Cortizone cream.  She will try this as she has only been using Benadryl cream.   Sounds like he is having an allergic reaction to BP cuff.  It is his right arm, his device was a left sided implant.

## 2016-07-13 ENCOUNTER — Ambulatory Visit (INDEPENDENT_AMBULATORY_CARE_PROVIDER_SITE_OTHER): Payer: BLUE CROSS/BLUE SHIELD | Admitting: *Deleted

## 2016-07-13 DIAGNOSIS — I495 Sick sinus syndrome: Secondary | ICD-10-CM | POA: Diagnosis not present

## 2016-07-13 LAB — CUP PACEART INCLINIC DEVICE CHECK
Battery Voltage: 3.08 V
Brady Statistic RA Percent Paced: 18 %
Date Time Interrogation Session: 20180425143423
Implantable Lead Implant Date: 20180410
Implantable Lead Location: 753860
Implantable Pulse Generator Implant Date: 20180410
Lead Channel Impedance Value: 512.5 Ohm
Lead Channel Pacing Threshold Amplitude: 0.5 V
Lead Channel Pacing Threshold Pulse Width: 0.5 ms
Lead Channel Setting Pacing Amplitude: 3.5 V
Lead Channel Setting Pacing Pulse Width: 0.5 ms
MDC IDC LEAD IMPLANT DT: 20180410
MDC IDC LEAD LOCATION: 753859
MDC IDC MSMT LEADCHNL RA SENSING INTR AMPL: 3.2 mV
MDC IDC MSMT LEADCHNL RV IMPEDANCE VALUE: 637.5 Ohm
MDC IDC MSMT LEADCHNL RV PACING THRESHOLD AMPLITUDE: 0.5 V
MDC IDC MSMT LEADCHNL RV PACING THRESHOLD PULSEWIDTH: 0.5 ms
MDC IDC MSMT LEADCHNL RV SENSING INTR AMPL: 7.3 mV
MDC IDC PG SERIAL: 8016790
MDC IDC SET LEADCHNL RA PACING AMPLITUDE: 3.5 V
MDC IDC SET LEADCHNL RV SENSING SENSITIVITY: 2 mV
MDC IDC STAT BRADY RV PERCENT PACED: 0 %
Pulse Gen Model: 2272

## 2016-07-13 NOTE — Progress Notes (Signed)
Wound check appointment. Steri-strips removed. Wound without redness or edema. Incision edges approximated, wound well healed. Normal device function. Thresholds, sensing, and impedances consistent with implant measurements. Device programmed at 3.5V programmed on for extra safety margin until 3 month visit. Histogram distribution appropriate for patient and level of activity. No mode switches or high ventricular rates noted. Patient educated about wound care, arm mobility, lifting restrictions. ROV with JA 10/10/16.

## 2016-07-14 ENCOUNTER — Other Ambulatory Visit: Payer: Self-pay | Admitting: *Deleted

## 2016-07-14 MED ORDER — HYDROCHLOROTHIAZIDE 12.5 MG PO CAPS: 12.5000 mg | ORAL_CAPSULE | Freq: Every day | ORAL | 3 refills | Status: DC

## 2016-07-20 ENCOUNTER — Telehealth: Payer: Self-pay | Admitting: Internal Medicine

## 2016-07-20 NOTE — Telephone Encounter (Addendum)
Spoke with patients wife and let her know that the HCTZ he was taking is a low dose and should not be a problem.  The labs have not been received as of yet and the repeat labs are being drawn Fri of this week.  She will have them fax over on Fri.  She appreciated my call.

## 2016-07-20 NOTE — Telephone Encounter (Signed)
New message      Pt's PCP, Dr Moreen Fowler, stopped HCTZ last fri because of abnormal labs. Patient is having repeat lab work this fri the 4th.  Dr Moreen Fowler faxed [place] the labs.  Wife is calling to make sure Dr Rayann Heman is ok with pt stopping HCTZ.  Please call

## 2016-09-09 ENCOUNTER — Ambulatory Visit: Payer: BLUE CROSS/BLUE SHIELD | Admitting: Internal Medicine

## 2016-09-09 ENCOUNTER — Encounter: Payer: Self-pay | Admitting: Internal Medicine

## 2016-09-09 ENCOUNTER — Ambulatory Visit (INDEPENDENT_AMBULATORY_CARE_PROVIDER_SITE_OTHER): Payer: BLUE CROSS/BLUE SHIELD | Admitting: Internal Medicine

## 2016-09-09 VITALS — BP 136/80 | HR 78 | Ht 65.0 in | Wt 185.2 lb

## 2016-09-09 DIAGNOSIS — I10 Essential (primary) hypertension: Secondary | ICD-10-CM | POA: Diagnosis not present

## 2016-09-09 DIAGNOSIS — I495 Sick sinus syndrome: Secondary | ICD-10-CM | POA: Diagnosis not present

## 2016-09-09 DIAGNOSIS — I2510 Atherosclerotic heart disease of native coronary artery without angina pectoris: Secondary | ICD-10-CM

## 2016-09-09 MED ORDER — CARVEDILOL 3.125 MG PO TABS: 3.1250 mg | ORAL_TABLET | Freq: Two times a day (BID) | ORAL | 6 refills | Status: DC

## 2016-09-09 NOTE — Progress Notes (Signed)
Follow-up Outpatient Visit Date: 09/09/2016  Primary Care Provider: Antony Contras, MD [address] [place] [id number]  Chief Complaint: Follow-up fatigue and bradycardia  HPI:  Mr. Mcelhiney is a 71 y.o. year-old male with history of sick sinus syndrome status post dual-chamber pacemaker in 06/2016, hypertension, stroke (2002) and encephalitis, who presents for follow-up of fatigue and bradycardia. I last saw him on 06/10/16, at which time we reviewed his heart monitor demonstrating intermittent sinus arrest with junction escape rhythm in the 30's. He was seen later that day by Dr. Rayann Heman and subsequently underwent PPM placement on 06/28/16. Today, Mr. Lax reports feeling significantly better. His fatigue has resolved. He is able to carry out his normal activities again without any limitations. His pacemaker site has healed well. Mr. Schonberg reports a single episode of sharp lower chest and upper abdominal pain that occurred while standing on a ladder a few weeks ago. The pain lasted lest than 1 minute and has not recurred. There were no associated symptoms. He denies shortness of breath, palpitations, lightheadedness, and edema. His sleep study is coming up in the next few weeks.  Since our last visit, HCTZ was discontinued by his PCP due to hyponatremia. Sodium has improved, though home blood pressures have been suboptimally controlled, with systolic pressure ranging from 130-160 mmHg.  --------------------------------------------------------------------------------------------------  Cardiovascular History & Procedures: Cardiovascular Problems:  Sinus arrest with junctional bradycardia in the setting of beta-blocker use  Stroke (2002)  Coronary artery disease  Risk Factors:  Known CAD, stroke, hypertension, male gender, and age > 44  Cath/PCI:  LHC (04/01/16): Mild ectasia of the proximal LAD.  Distal LAD diffusely diseased with focal area of 90% stenosis.   Dominant LCx with diffuse ectasia and diffuse 50% stenosis of lPDA.  60% mid RCA lesion.  Normal LV contraction.  Mildly elevated LVEDP.  CV Surgery:  None  EP Procedures and Devices:  Dual chamber pacemaker (06/28/16, Dr. Rayann Heman): [employer] Assurity  30 day event monitor (05/03/16): Patient was monitored for 30 days with predominately normal sinus rhythm (average rate 65 bpm (range 38-104 bpm). Sinus pauses with junctional escapes noted both at night and during the daytime (longest RR interval 3.8 seconds). Rare PACs noted.  Temporary transvenous pacemaker (04/01/16)  Non-Invasive Evaluation(s):  Exercise tolerance test (04/28/16): Patient exercised 5 minutes, 0 seconds (7 METS) achieving a maximal heart rate of 115 bpm (76% MPHR). No significant ST segment changes were noted. Test inconclusive for ischemia due to target heart rate not having been achieved.  Transthoracic echocardiogram (04/02/16): Normal LV size with moderate LVH.  LVEF 65-70% with normal wall motion.  Grade 1 diastolic dysfunction.  Mild aortic regurgitation.  Normal RV size and function.  Recent CV Pertinent Labs: Lab Results  Component Value Date   CHOL 102 04/02/2016   HDL 40 (L) 04/02/2016   LDLCALC 43 04/02/2016   TRIG 97 04/02/2016   CHOLHDL 2.6 04/02/2016   INR 0.99 04/01/2016   K 4.4 06/28/2016   MG 2.1 04/01/2016   BUN 8 06/28/2016   BUN 7 (L) 04/28/2016   CREATININE 0.70 06/28/2016    Past medical and surgical history were reviewed and updated in EPIC.  Outpatient Encounter Prescriptions as of 09/09/2016  Medication Sig  . amLODipine (NORVASC) 10 MG tablet Take 10 mg by mouth daily.  Marland Kitchen atorvastatin (LIPITOR) 10 MG tablet Take 1 tablet (10 mg total) by mouth daily.  . carbamazepine (TEGRETOL) 200 MG tablet Take 200 mg by mouth  4 (four) times daily.   . citalopram (CELEXA) 40 MG tablet Take 40 mg by mouth daily.    . clorazepate (TRANXENE-T) 3.75 MG tablet Take 3.75 mg by mouth 2 (two) times daily.      Marland Kitchen dipyridamole-aspirin (AGGRENOX) 200-25 MG 12hr capsule Take 2 capsules by mouth at bedtime.   . fluticasone (FLONASE) 50 MCG/ACT nasal spray Place 2 sprays into both nostrils at bedtime as needed for allergies or rhinitis.  . folic acid (FOLVITE) 1 MG tablet Take 1 mg by mouth daily.    Marland Kitchen gabapentin (NEURONTIN) 400 MG tablet Take 400 mg by mouth 4 (four) times daily.    Marland Kitchen losartan (COZAAR) 100 MG tablet Take 100 mg by mouth daily.  . nitroGLYCERIN (NITROSTAT) 0.4 MG SL tablet Place 1 tablet (0.4 mg total) under the tongue every 5 (five) minutes x 3 doses as needed for chest pain.  . [DISCONTINUED] hydrochlorothiazide (MICROZIDE) 12.5 MG capsule Take 1 capsule (12.5 mg total) by mouth daily.  . [DISCONTINUED] losartan (COZAAR) 100 MG tablet Take 1 tablet (100 mg total) by mouth daily.   No facility-administered encounter medications on file as of 09/09/2016.     Allergies: Cyclosporine and Serzone [nefazodone hcl]  Social History   Social History  . Marital status: Married    Spouse name: N/A  . Number of children: N/A  . Years of education: N/A   Occupational History  . Not on file.   Social History Main Topics  . Smoking status: Former Smoker    Years: 1.00  . 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 Alcohol use No  . Drug use: No  . Sexual activity: No   Other Topics Concern  . Not on file   Social History Narrative  . No narrative on file    Family History  Problem Relation Age of Onset  . Heart attack Mother        in her 48's  . Heart disease Mother        skipped beats  . Cancer Father        liver    Review of Systems: A 12-system review of systems was performed and was negative except as noted in the HPI.  --------------------------------------------------------------------------------------------------  Physical Exam: BP 136/80   Pulse 78   Ht 5\' 5"  (1.651 m)   Wt 185 lb 3.2 oz (84 kg)    BMI 30.82 kg/m   General:  Overweight man, seated comfortably in the exam room. He is accompanied by his wife. HEENT: No conjunctival pallor or scleral icterus.  Moist mucous membranes.  OP clear. Neck: Supple without lymphadenopathy, thyromegaly, JVD, or HJR. Lungs: Normal work of breathing.  Clear to auscultation bilaterally without wheezes or crackles. Heart: Regular rate and rhythm without murmurs, rubs, or gallops.  Non-displaced PMI. Abd: Bowel sounds present.  Soft, NT/ND without hepatosplenomegaly Ext: No lower extremity edema.  Radial, PT, and DP pulses are 2+ bilaterally. Skin: Warm and dry without rash. Left upper chest pacemaker incision is well-healed.   Lab Results  Component Value Date   WBC 5.4 06/28/2016   HGB 14.1 06/28/2016   HCT 39.1 06/28/2016   MCV 86.3 06/28/2016   PLT 124 (L) 06/28/2016    Lab Results  Component Value Date   NA 135 06/28/2016   K 4.4 06/28/2016   CL 98 (L) 06/28/2016   CO2 27 06/28/2016   BUN 8  06/28/2016   CREATININE 0.70 06/28/2016   GLUCOSE 94 06/28/2016   ALT 18 04/01/2016    Lab Results  Component Value Date   CHOL 102 04/02/2016   HDL 40 (L) 04/02/2016   LDLCALC 43 04/02/2016   TRIG 97 04/02/2016   CHOLHDL 2.6 04/02/2016    --------------------------------------------------------------------------------------------------  ASSESSMENT AND PLAN: Sick sinus syndrome Mr. Skelton is status post pacemaker placement in early April and reports resolution of his previous fatigue. His incision appears well-healed today. He is due to follow-up with Dr. Rayann Heman next month. I will defer further management to Dr. Rayann Heman and the device clinic.  Hypertension Mr. Sciuto reports elevated blood pressures at home, particularly since discontinuation of HCTZ due to hyponatremia. We have agreed to start carvedilol 3.125 mg BID. He did not tolerate metoprolol well in the past due to fatigue and bradycardia. However, this was before his pacemaker  insertion; I suspect that he will do better with beta-blockade now that sick sinus syndrome and bradycardia are no longer an issue. I have asked Mr. Soulliere to contact us in about 2 weeks to let us know how he is tolerating the medication and if his BP is better controlled. We may need to escalate therapy further at that time.  Coronary artery disease with atypical chest pain. Mr. Reister reports a single episode of sharp chest and upper abdominal pain while standing on a ladder. He denies exertional chest pain and shortness of breath. Catheterization in January showed significant disease involving the distal LAD, which is too small for intervention (I have again reviewed the images today). We will continue with medical therapy, including addition of carvedilol, as above.  Follow-up: Return to clinic in 3 months.  Nelva Bush, MD 09/11/2016 11:13 AM

## 2016-09-09 NOTE — Patient Instructions (Signed)
Medication Instructions:  Start coreg (cavedilol) 3.125 mg two times a day.  Labwork: None  Testing/Procedures: None   Follow-Up: Your physician recommends that you schedule a follow-up appointment in: 3 months with Dr End.    Any Other Special Instructions Will Be Listed Below (If Applicable).  In a week or two let Dr End know how you are tolerating the coreg (carvedilol). You can send this in through [facility].    If you need a refill on your cardiac medications before your next appointment, please call your pharmacy.

## 2016-10-03 ENCOUNTER — Telehealth: Payer: Self-pay | Admitting: Internal Medicine

## 2016-10-03 MED ORDER — CARVEDILOL 6.25 MG PO TABS: 6.2500 mg | ORAL_TABLET | Freq: Two times a day (BID) | ORAL | 1 refills | Status: DC

## 2016-10-03 NOTE — Telephone Encounter (Signed)
New message    Pt is calling.   Pt c/o BP issue: STAT if pt c/o blurred vision, one-sided weakness or slurred speech  1. What are your last 5 BP readings? 135/83 p-73, 152/85 p-75, 158/87 p-74, 148/84 p-97, 147/87 p--89, 170/93 p-76  2. Are you having any other symptoms (ex. Dizziness, headache, blurred vision, passed out)? No   3. What is your BP issue? Pt states his bp is still high even on the low dose bp medicine he was given.

## 2016-10-03 NOTE — Telephone Encounter (Signed)
Discussed with pt, verbalized understanding, he will continue take and record BP

## 2016-10-03 NOTE — Telephone Encounter (Signed)
Pt states he is tolerating carvedilol without side effects. Pt states lowest readings after working in yard/mowing grass, other readings during work day.  Pt advised I will forward to Dr End for review.

## 2016-10-03 NOTE — Telephone Encounter (Signed)
I am glad that Aaron Keller is tolerating carvedilol. Given that his BP is suboptimally controlled, please have him increase carvedilol to 6.25 mg BID. Thanks.

## 2016-10-10 ENCOUNTER — Encounter: Payer: BLUE CROSS/BLUE SHIELD | Admitting: Internal Medicine

## 2016-10-10 ENCOUNTER — Ambulatory Visit (INDEPENDENT_AMBULATORY_CARE_PROVIDER_SITE_OTHER): Payer: BLUE CROSS/BLUE SHIELD | Admitting: Internal Medicine

## 2016-10-10 ENCOUNTER — Encounter: Payer: Self-pay | Admitting: Internal Medicine

## 2016-10-10 VITALS — BP 132/78 | HR 63 | Ht 64.5 in | Wt 188.6 lb

## 2016-10-10 DIAGNOSIS — I495 Sick sinus syndrome: Secondary | ICD-10-CM | POA: Diagnosis not present

## 2016-10-10 DIAGNOSIS — I2510 Atherosclerotic heart disease of native coronary artery without angina pectoris: Secondary | ICD-10-CM | POA: Diagnosis not present

## 2016-10-10 DIAGNOSIS — R0683 Snoring: Secondary | ICD-10-CM | POA: Diagnosis not present

## 2016-10-10 DIAGNOSIS — I10 Essential (primary) hypertension: Secondary | ICD-10-CM

## 2016-10-10 NOTE — Progress Notes (Signed)
PCP: Antony Contras, MD Primary Cardiologist:  Dr End  Aaron Keller is a 71 y.o. male who presents today for routine electrophysiology followup.  Since his recent PPM implant, the patient reports doing very well.  His fatigue has resolved.  He feels "much better".  Today, he denies symptoms of palpitations, chest pain, shortness of breath,  lower extremity edema, dizziness, presyncope, or syncope.  The patient is otherwise without complaint today.   Past Medical History:  Diagnosis Date  . CAD in native artery- non obstructive on cath  04/04/2016  . Encephalitis 02/18/1985  . Hepatitis ~ 1959   "don't know what kind"  . HTN (hypertension) 04/04/2016  . Hx of tonic-clonic seizures 02/18/1985 X 1  . Hypertension   . Nasal bleeding    "because of one of the RX I was on" (06/29/2016)  . Presence of permanent cardiac pacemaker   . Seasonal allergies   . Seizures ([place]) 02/18/1985 X 1   "related to encephalitis"  . Sick sinus syndrome ([place])   . Stroke [facility])    "I've had 2 minor ones"; denies residual on 06/28/2016  . Symptomatic bradycardia 03/2016   Past Surgical History:  Procedure Laterality Date  . APPENDECTOMY    . CARDIAC CATHETERIZATION N/A 04/01/2016   Procedure: Left Heart Cath and Coronary Angiography;  Surgeon: Peter M Martinique, MD;  Location: [facility];  Service: Cardiovascular;  Laterality: N/A;  . CARDIAC CATHETERIZATION N/A 04/01/2016   Procedure: Temporary Pacemaker;  Surgeon: Peter M Martinique, MD;  Location: [facility] [facility];  Service: Cardiovascular;  Laterality: N/A;  . CATARACT EXTRACTION W/ INTRAOCULAR LENS  IMPLANT, BILATERAL Bilateral ~ 2007  . ELBOW SURGERY Right   . FRACTURE SURGERY    . INGUINAL HERNIA REPAIR Right 08/2010  . INSERT / REPLACE / REMOVE PACEMAKER  06/28/2016  . PACEMAKER IMPLANT N/A 06/28/2016   Procedure: Pacemaker Implant;  Surgeon: Thompson Grayer, MD;  Location: [facility] [place] [facility];  Service: Cardiovascular;  Laterality: N/A;  . WRIST  FRACTURE SURGERY Left 1950s    ROS- all systems are reviewed and negative except as per HPI above  Current Outpatient Prescriptions  Medication Sig Dispense Refill  . amLODipine (NORVASC) 10 MG tablet Take 10 mg by mouth daily.    Marland Kitchen atorvastatin (LIPITOR) 10 MG tablet Take 1 tablet (10 mg total) by mouth daily. 30 tablet 6  . carbamazepine (TEGRETOL) 200 MG tablet Take 200 mg by mouth 4 (four) times daily.     . carvedilol (COREG) 6.25 MG tablet Take 1 tablet (6.25 mg total) by mouth 2 (two) times daily. 180 tablet 1  . citalopram (CELEXA) 40 MG tablet Take 40 mg by mouth daily.      . clorazepate (TRANXENE-T) 3.75 MG tablet Take 3.75 mg by mouth 2 (two) times daily.      Marland Kitchen dipyridamole-aspirin (AGGRENOX) 200-25 MG 12hr capsule Take 2 capsules by mouth at bedtime.     . fluticasone (FLONASE) 50 MCG/ACT nasal spray Place 2 sprays into both nostrils at bedtime as needed for allergies or rhinitis.    . folic acid (FOLVITE) 1 MG tablet Take 1 mg by mouth daily.      Marland Kitchen gabapentin (NEURONTIN) 400 MG tablet Take 400 mg by mouth 4 (four) times daily.      Marland Kitchen losartan (COZAAR) 100 MG tablet Take 100 mg by mouth daily.    . nitroGLYCERIN (NITROSTAT) 0.4 MG SL tablet Place 1 tablet (0.4 mg total) under the  tongue every 5 (five) minutes x 3 doses as needed for chest pain. 25 tablet 4   No current facility-administered medications for this visit.     Physical Exam: Vitals:   10/10/16 1544  BP: 132/78  Pulse: 63  SpO2: 97%  Weight: 188 lb 9.6 oz (85.5 kg)  Height: 5' 4.5" (1.638 m)    GEN- The patient is well appearing, alert and oriented x 3 today.   Head- normocephalic, atraumatic Eyes-  Sclera clear, conjunctiva pink Ears- hearing intact Oropharynx- clear Lungs- Clear to ausculation bilaterally, normal work of breathing Chest- pacemaker pocket is well healed Heart- Regular rate and rhythm, no murmurs, rubs or gallops, PMI not laterally displaced GI- soft, NT, ND, + BS Extremities- no  clubbing, cyanosis, or edema  Pacemaker interrogation- reviewed in detail today,  See PACEART report  ekg today reveals sinus rhythm 63 bpm, PR 168 msec, LAD, otherwise normal ekg  Assessment and Plan:  1. Symptomatic sinus bradycardia/ sick sinus syndrome Normal pacemaker function See Pace Art report No changes today  2. CAD No ischemic symptoms No changes today  3. HTN Stable No change required today  4. Snoring Sleep study with Dr Maxwell Caul was cancelled by the patient.  Given elevated BP, I have informed him that I still think he should have his sleep study.  Merlin Return to see EP NP in 1 year Follow-up with Dr End as scheduled  Thompson Grayer MD, [facility] 10/10/2016 4:01 PM

## 2016-10-10 NOTE — Patient Instructions (Addendum)
Medication Instructions:  Your physician recommends that you continue on your current medications as directed. Please refer to the Current Medication list given to you today.   Labwork: None ordered   Testing/Procedures: None ordered   Follow-Up: Remote monitoring is used to monitor your Pacemaker  from home. This monitoring reduces the number of office visits required to check your device to one time per year. It allows [place] to keep an eye on the functioning of your device to ensure it is working properly. You are scheduled for a device check from home on 01/09/17. You may send your transmission at any time that day. If you have a wireless device, the transmission will be sent automatically. After your physician reviews your transmission, you will receive a postcard with your next transmission date.   Your physician wants you to follow-up in: 12 months with [place] will receive a reminder letter in the mail two months in advance. If you don't receive a letter, please call our office to schedule the follow-up appointment.    Any Other Special Instructions Will Be Listed Below (If Applicable).     If you need a refill on your cardiac medications before your next appointment, please call your pharmacy.

## 2016-10-14 LAB — CUP PACEART INCLINIC DEVICE CHECK
Brady Statistic RV Percent Paced: 0.01 %
Implantable Lead Location: 753859
Lead Channel Pacing Threshold Amplitude: 0.5 V
Lead Channel Sensing Intrinsic Amplitude: 1.7 mV
Lead Channel Sensing Intrinsic Amplitude: 7.9 mV
Lead Channel Setting Pacing Amplitude: 2 V
MDC IDC LEAD IMPLANT DT: 20180410
MDC IDC LEAD IMPLANT DT: 20180410
MDC IDC LEAD LOCATION: 753860
MDC IDC MSMT BATTERY REMAINING LONGEVITY: 135 mo
MDC IDC MSMT BATTERY VOLTAGE: 3.01 V
MDC IDC MSMT LEADCHNL RA IMPEDANCE VALUE: 475 Ohm
MDC IDC MSMT LEADCHNL RA PACING THRESHOLD PULSEWIDTH: 0.5 ms
MDC IDC MSMT LEADCHNL RV IMPEDANCE VALUE: 600 Ohm
MDC IDC MSMT LEADCHNL RV PACING THRESHOLD AMPLITUDE: 0.5 V
MDC IDC MSMT LEADCHNL RV PACING THRESHOLD PULSEWIDTH: 0.5 ms
MDC IDC PG IMPLANT DT: 20180410
MDC IDC PG SERIAL: 8016790
MDC IDC SESS DTM: 20180723194923
MDC IDC SET LEADCHNL RV PACING AMPLITUDE: 2.5 V
MDC IDC SET LEADCHNL RV PACING PULSEWIDTH: 0.5 ms
MDC IDC SET LEADCHNL RV SENSING SENSITIVITY: 2 mV
MDC IDC STAT BRADY RA PERCENT PACED: 34 %
Pulse Gen Model: 2272

## 2016-11-04 ENCOUNTER — Telehealth: Payer: Self-pay | Admitting: Internal Medicine

## 2016-11-04 MED ORDER — CARVEDILOL 12.5 MG PO TABS: 12.5000 mg | ORAL_TABLET | Freq: Two times a day (BID) | ORAL | 1 refills | Status: DC

## 2016-11-04 NOTE — Telephone Encounter (Signed)
Pt's wife states BP still running 140-160/70-80, HR 70s-80s, carvedilol increased 10/03/16 to 6.25mg  two times a day.  I reviewed with Dr Chryl Heck carvedilol to 12.5 mg two time a day, continue to monitor BP and HR.

## 2016-11-04 NOTE — Telephone Encounter (Signed)
New message  Pt c/o medication issue:  1. Name of Medication: Carvedilol  2. How are you currently taking this medication (dosage and times per day)? 6.25mg    3. Are you having a reaction (difficulty breathing--STAT)? Per pt wife not keep bp down ranging 140-160  4. What is your medication issue? Per pt wife would like to speak with RN to discuss if medication needs to be adjusted. Please call back to discuss

## 2016-12-12 ENCOUNTER — Encounter: Payer: Self-pay | Admitting: Internal Medicine

## 2016-12-12 ENCOUNTER — Ambulatory Visit (INDEPENDENT_AMBULATORY_CARE_PROVIDER_SITE_OTHER): Payer: BLUE CROSS/BLUE SHIELD | Admitting: Internal Medicine

## 2016-12-12 VITALS — BP 148/80 | HR 72 | Ht 64.5 in | Wt 188.8 lb

## 2016-12-12 DIAGNOSIS — R5382 Chronic fatigue, unspecified: Secondary | ICD-10-CM | POA: Diagnosis not present

## 2016-12-12 DIAGNOSIS — I10 Essential (primary) hypertension: Secondary | ICD-10-CM | POA: Diagnosis not present

## 2016-12-12 DIAGNOSIS — I2510 Atherosclerotic heart disease of native coronary artery without angina pectoris: Secondary | ICD-10-CM

## 2016-12-12 DIAGNOSIS — E782 Mixed hyperlipidemia: Secondary | ICD-10-CM

## 2016-12-12 MED ORDER — CARVEDILOL 12.5 MG PO TABS: 12.5000 mg | ORAL_TABLET | Freq: Two times a day (BID) | ORAL | 3 refills | Status: DC

## 2016-12-12 NOTE — Progress Notes (Signed)
Follow-up Outpatient Visit Date: 12/12/2016  Primary Care Provider: Antony Contras, MD [address] [place] [address]  Chief Complaint: Elevated blood pressure  HPI:  Aaron Keller is a 71 y.o. year-old male with history of coronary artery disease, sick sinus syndrome status post dual-chamber pacemaker, hypertension, stroke, and encephalitis, who presents for follow-up of hypertension and fatigue. I last saw him in late June, at which time Aaron Keller was doing relatively well. His fatigue had improved significantly following pacemaker implantation. His blood pressure remained elevated at the time, prompting [place] to escalate carvedilol. Today, he reports feeling well with the exception of some fatigue and shortness of breath using weed eater. He has not had any chest pain, palpitations, lightheadedness, orthopnea, or edema. He reports sleeping "okay" at night, though his wife notes that Aaron Keller snores. He was previously referred for a sleep study but wanted canceling this appointment. He does not believe that he has sleep apnea. He has tolerated his current blood pressure medications well, though readings brought in by his wife today demonstrate considerable variability, with systolic pressures ranging from 120-160 mmHg.  --------------------------------------------------------------------------------------------------  Cardiovascular History & Procedures: Cardiovascular Problems:  Sinus arrest with junctional bradycardia in the setting of beta-blocker use  Stroke (2002)  Coronary artery disease  Risk Factors:  Known CAD, stroke, hypertension, male gender, and age > 39  Cath/PCI:  LHC (04/01/16): Mild ectasia of the proximal LAD.  Distal LAD diffusely diseased with focal area of 90% stenosis.  Dominant LCx with diffuse ectasia and diffuse 50% stenosis of lPDA.  60% mid RCA lesion.  Normal LV contraction.  Mildly elevated LVEDP.  CV Surgery:  None  EP  Procedures and Devices:  Dual chamber pacemaker (06/28/16, Dr. Rayann Heman): St. Jude Assurity  30 day event monitor (05/03/16): Patient was monitored for 30 days with predominately normal sinus rhythm (average rate 65 bpm (range 38-104 bpm). Sinus pauses with junctional escapes noted both at night and during the daytime (longest RR interval 3.8 seconds). Rare PACs noted.  Temporary transvenous pacemaker (04/01/16)  Non-Invasive Evaluation(s):  Exercise tolerance test (04/28/16): Patient exercised 5 minutes, 0 seconds (7 METS) achieving a maximal heart rate of 115 bpm (76% MPHR). No significant ST segment changes were noted. Test inconclusive for ischemia due to target heart rate not having been achieved.  Transthoracic echocardiogram (04/02/16): Normal LV size with moderate LVH.  LVEF 65-70% with normal wall motion.  Grade 1 diastolic dysfunction.  Mild aortic regurgitation.  Normal RV size and function.  Recent CV Pertinent Labs: Lab Results  Component Value Date   CHOL 102 04/02/2016   HDL 40 (L) 04/02/2016   LDLCALC 43 04/02/2016   TRIG 97 04/02/2016   CHOLHDL 2.6 04/02/2016   INR 0.99 04/01/2016   K 4.4 06/28/2016   MG 2.1 04/01/2016   BUN 8 06/28/2016   BUN 7 (L) 04/28/2016   CREATININE 0.70 06/28/2016   Past medical and surgical history were reviewed and updated in EPIC.  Current Meds  Medication Sig  . amLODipine (NORVASC) 10 MG tablet Take 10 mg by mouth daily.  Marland Kitchen atorvastatin (LIPITOR) 10 MG tablet Take 1 tablet (10 mg total) by mouth daily.  . carbamazepine (TEGRETOL) 200 MG tablet Take 200 mg by mouth 4 (four) times daily.   . carvedilol (COREG) 12.5 MG tablet Take 1 tablet (12.5 mg total) by mouth 2 (two) times daily.  . citalopram (CELEXA) 40 MG tablet Take 40 mg by mouth daily.    Marland Kitchen  clorazepate (TRANXENE-T) 3.75 MG tablet Take 3.75 mg by mouth 2 (two) times daily.    Marland Kitchen dipyridamole-aspirin (AGGRENOX) 200-25 MG 12hr capsule Take 2 capsules by mouth at bedtime.   .  fluticasone (FLONASE) 50 MCG/ACT nasal spray Place 2 sprays into both nostrils at bedtime as needed for allergies or rhinitis.  . folic acid (FOLVITE) 1 MG tablet Take 1 mg by mouth daily.    Marland Kitchen gabapentin (NEURONTIN) 400 MG tablet Take 400 mg by mouth 4 (four) times daily.    Marland Kitchen losartan (COZAAR) 100 MG tablet Take 100 mg by mouth daily.  . nitroGLYCERIN (NITROSTAT) 0.4 MG SL tablet Place 1 tablet (0.4 mg total) under the tongue every 5 (five) minutes x 3 doses as needed for chest pain.    Allergies: Cyclosporine and Serzone [nefazodone hcl]  Social History   Social History  . Marital status: Married    Spouse name: N/A  . Number of children: N/A  . Years of education: N/A   Occupational History  . Not on file.   Social History Main Topics  . Smoking status: Former Smoker    Years: 1.00  . 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 Alcohol use No  . Drug use: No  . Sexual activity: No   Other Topics Concern  . Not on file   Social History Narrative  . No narrative on file    Family History  Problem Relation Age of Onset  . Heart attack Mother        in her 44's  . Heart disease Mother        skipped beats  . Cancer Father        liver    Review of Systems: Aaron Keller notes increased urinary frequency, particularly in the afternoon after drinking diet Coke. A 12-system review of systems was performed and was negative except as noted in the HPI.  --------------------------------------------------------------------------------------------------  Physical Exam: BP (!) 148/80   Pulse 72   Ht 5' 4.5" (1.638 m)   Wt 188 lb 12.8 oz (85.6 kg)   SpO2 98%   BMI 31.91 kg/m   General:  Overweight man, seated comfortably in the exam room. He is accompanied by his wife. HEENT: No conjunctival pallor or scleral icterus. Moist mucous membranes.  OP clear. Neck: Supple without lymphadenopathy, thyromegaly, JVD, or  HJR. Lungs: Normal work of breathing. Clear to auscultation bilaterally without wheezes or crackles. Heart: Regular rate and rhythm without murmurs, rubs, or gallops. Non-displaced PMI. Abd: Bowel sounds present. Soft, NT/ND without hepatosplenomegaly Ext: No lower extremity edema. Radial, PT, and DP pulses are 2+ bilaterally. Skin: Warm and dry without rash. Left chest pacemaker pocket is well-healed.  Lab Results  Component Value Date   WBC 5.4 06/28/2016   HGB 14.1 06/28/2016   HCT 39.1 06/28/2016   MCV 86.3 06/28/2016   PLT 124 (L) 06/28/2016    Lab Results  Component Value Date   NA 135 06/28/2016   K 4.4 06/28/2016   CL 98 (L) 06/28/2016   CO2 27 06/28/2016   BUN 8 06/28/2016   CREATININE 0.70 06/28/2016   GLUCOSE 94 06/28/2016   ALT 18 04/01/2016    Lab Results  Component Value Date   CHOL 102 04/02/2016   HDL 40 (L) 04/02/2016   LDLCALC 43 04/02/2016   TRIG 97 04/02/2016   CHOLHDL 2.6 04/02/2016    --------------------------------------------------------------------------------------------------  ASSESSMENT  AND PLAN: Fatigue This is likely multifactorial. We again discussed the potential for sleep apnea. Aaron Keller is reluctant to undergo a sleep study but ultimately is willing to proceed. We will defer this until after completion of renal artery Doppler, as below.  Hypertension Blood pressure remains elevated and somewhat labile despite being on 3 antihypertensive medications (amlodipine, carvedilol, and losartan). Aaron Keller was intolerant of HCTZ due to hyponatremia. We discussed evaluation for secondary hypertension and have agreed to proceed with a renal artery Doppler. If this is unrevealing, Aaron Keller would be willing to undergo a sleep study. I will not make any medication changes today.  Coronary artery disease No chest pain. If he continues to have fatigue and elevated blood pressure, addition of a long-acting nitrate will be  considered.  Hyperlipidemia LDL in January was excellent at 43. We will continue with atorvastatin 10 mg daily.  Follow-up: Return to clinic in 3 months.  Aaron Bush, MD 12/12/2016 3:40 PM

## 2016-12-12 NOTE — Patient Instructions (Signed)
Medication Instructions:  Your physician recommends that you continue on your current medications as directed. Please refer to the Current Medication list given to you today.   Labwork: None   Testing/Procedures: Your physician has requested that you have a renal artery duplex. During this test, an ultrasound is used to evaluate blood flow to the kidneys. Allow one hour for this exam. Do not eat after midnight the day before and avoid carbonated beverages. Take your medications as you usually do.    Follow-Up: Your physician recommends that you schedule a follow-up appointment in: 3 months with Dr End.    Any Other Special Instructions Will Be Listed Below (If Applicable).     If you need a refill on your cardiac medications before your next appointment, please call your pharmacy.

## 2016-12-13 ENCOUNTER — Encounter: Payer: Self-pay | Admitting: Internal Medicine

## 2016-12-13 DIAGNOSIS — E785 Hyperlipidemia, unspecified: Secondary | ICD-10-CM | POA: Insufficient documentation

## 2016-12-30 ENCOUNTER — Ambulatory Visit (HOSPITAL_COMMUNITY)
Admission: RE | Admit: 2016-12-30 | Discharge: 2016-12-30 | Disposition: A | Discharge status: Home / Self Care | Payer: BLUE CROSS/BLUE SHIELD | Source: Ambulatory Visit | Attending: Cardiology | Admitting: Cardiology

## 2016-12-30 DIAGNOSIS — I10 Essential (primary) hypertension: Secondary | ICD-10-CM

## 2017-01-03 ENCOUNTER — Telehealth: Payer: Self-pay | Admitting: Internal Medicine

## 2017-01-03 DIAGNOSIS — I10 Essential (primary) hypertension: Secondary | ICD-10-CM

## 2017-01-03 DIAGNOSIS — R0683 Snoring: Secondary | ICD-10-CM

## 2017-01-03 NOTE — Telephone Encounter (Signed)
New message    Pt is calling asking for a call back from [place]. He said it's about scheduling a sleep study.

## 2017-01-03 NOTE — Telephone Encounter (Signed)
Thanks.  Nelva Bush, MD [facility] Pager: [phone]

## 2017-01-03 NOTE — Telephone Encounter (Signed)
  Notes recorded by Nelva Bush, MD on 12/31/2016 at 10:48 PM EDT Please let Mr. Housey know that the renal artery ultrasound does not show any sign of narrowing in either renal artery. As discussed at our last visit, I recommend that we refer him for a sleep study to exclude sleep apnea as a cause of his difficult to control hypertension.

## 2017-01-03 NOTE — Telephone Encounter (Signed)
Pt has decided he wants to go ahead with sleep study as suggested by Dr End, will forward to contact pt to make arrangements for sleep study.

## 2017-01-03 NOTE — Telephone Encounter (Signed)
Will forward to Gae Bon to contact pt to make arrangements for sleep study.

## 2017-01-09 ENCOUNTER — Ambulatory Visit (INDEPENDENT_AMBULATORY_CARE_PROVIDER_SITE_OTHER): Payer: BLUE CROSS/BLUE SHIELD | Admitting: *Deleted

## 2017-01-09 DIAGNOSIS — I495 Sick sinus syndrome: Secondary | ICD-10-CM | POA: Diagnosis not present

## 2017-01-09 NOTE — Progress Notes (Signed)
Remote pacemaker transmission.   

## 2017-01-11 LAB — CUP PACEART REMOTE DEVICE CHECK
Battery Remaining Longevity: 113 mo
Battery Remaining Percentage: 95.5 %
Battery Voltage: 3.02 V
Brady Statistic AS VP Percent: 0 %
Brady Statistic RA Percent Paced: 82 %
Date Time Interrogation Session: 20181022060013
Implantable Lead Implant Date: 20180410
Implantable Lead Location: 753859
Lead Channel Impedance Value: 460 Ohm
Lead Channel Impedance Value: 540 Ohm
Lead Channel Pacing Threshold Amplitude: 0.5 V
Lead Channel Pacing Threshold Pulse Width: 0.5 ms
MDC IDC LEAD IMPLANT DT: 20180410
MDC IDC LEAD LOCATION: 753860
MDC IDC MSMT LEADCHNL RA SENSING INTR AMPL: 2.1 mV
MDC IDC MSMT LEADCHNL RV PACING THRESHOLD AMPLITUDE: 0.5 V
MDC IDC MSMT LEADCHNL RV PACING THRESHOLD PULSEWIDTH: 0.5 ms
MDC IDC MSMT LEADCHNL RV SENSING INTR AMPL: 7.7 mV
MDC IDC PG IMPLANT DT: 20180410
MDC IDC PG SERIAL: 8016790
MDC IDC SET LEADCHNL RA PACING AMPLITUDE: 2 V
MDC IDC SET LEADCHNL RV PACING AMPLITUDE: 2.5 V
MDC IDC SET LEADCHNL RV PACING PULSEWIDTH: 0.5 ms
MDC IDC SET LEADCHNL RV SENSING SENSITIVITY: 2 mV
MDC IDC STAT BRADY AP VP PERCENT: 1 %
MDC IDC STAT BRADY AP VS PERCENT: 83 %
MDC IDC STAT BRADY AS VS PERCENT: 17 %
MDC IDC STAT BRADY RV PERCENT PACED: 1 %

## 2017-01-13 ENCOUNTER — Encounter: Payer: Self-pay | Admitting: Cardiology

## 2017-01-27 ENCOUNTER — Telehealth: Payer: Self-pay | Admitting: *Deleted

## 2017-01-27 NOTE — Telephone Encounter (Addendum)
Informed patient of upcoming sleep study and patient understanding was verbalized. Patient understands his sleep study is scheduled for Wednesday March 01 2017. Patient understands his sleep study will be done at [facility] sleep lab. Patient understands he will receive a sleep packet in a week or so. Patient understands to call if he does not receive the sleep packet in a timely manner. Patient agrees with treatment and thanked me for call.

## 2017-01-27 NOTE — Telephone Encounter (Signed)
-----   Message from Katrine Coho, RN sent at 01/09/2017  4:50 PM EDT ----- Regarding: RE: Sleep Study Nina-there is an order in Epic for split night sleep study. Do you need something else? Thanks ----- Message ----- From: Freada Bergeron, CMA Sent: 01/09/2017   4:44 PM To: Nelva Bush, MD, Katrine Coho, RN Subject: Sleep Study                                    Please send sleep study referral for patient. He called today for his date. Thanks, Gae Bon

## 2017-03-01 ENCOUNTER — Ambulatory Visit (HOSPITAL_BASED_OUTPATIENT_CLINIC_OR_DEPARTMENT_OTHER): Payer: BLUE CROSS/BLUE SHIELD | Attending: Internal Medicine | Admitting: Cardiology

## 2017-03-01 VITALS — Ht 65.0 in | Wt 188.0 lb

## 2017-03-01 DIAGNOSIS — R0683 Snoring: Secondary | ICD-10-CM | POA: Diagnosis present

## 2017-03-01 DIAGNOSIS — G4733 Obstructive sleep apnea (adult) (pediatric): Secondary | ICD-10-CM | POA: Diagnosis not present

## 2017-03-01 DIAGNOSIS — I10 Essential (primary) hypertension: Secondary | ICD-10-CM | POA: Insufficient documentation

## 2017-03-02 ENCOUNTER — Ambulatory Visit: Payer: BLUE CROSS/BLUE SHIELD | Admitting: Internal Medicine

## 2017-03-02 ENCOUNTER — Encounter: Payer: Self-pay | Admitting: Internal Medicine

## 2017-03-02 VITALS — BP 128/82 | HR 60 | Ht 65.0 in | Wt 188.0 lb

## 2017-03-02 DIAGNOSIS — E782 Mixed hyperlipidemia: Secondary | ICD-10-CM | POA: Diagnosis not present

## 2017-03-02 DIAGNOSIS — I495 Sick sinus syndrome: Secondary | ICD-10-CM

## 2017-03-02 DIAGNOSIS — I2510 Atherosclerotic heart disease of native coronary artery without angina pectoris: Secondary | ICD-10-CM | POA: Diagnosis not present

## 2017-03-02 DIAGNOSIS — I10 Essential (primary) hypertension: Secondary | ICD-10-CM

## 2017-03-02 NOTE — Patient Instructions (Addendum)
Medication Instructions:  Your physician recommends that you continue on your current medications as directed. Please refer to the Current Medication list given to you today.   Labwork: None  Testing/Procedures: None  Follow-Up: Your physician recommends that you schedule a follow-up appointment in: 3 months with Dr End.         If you need a refill on your cardiac medications before your next appointment, please call your pharmacy.   

## 2017-03-03 ENCOUNTER — Encounter: Payer: Self-pay | Admitting: Internal Medicine

## 2017-03-03 NOTE — Progress Notes (Signed)
Follow-up Outpatient Visit Date: 03/02/2017  Primary Care Provider: Antony Contras, MD [address] [place] [address]  Chief Complaint: Elevated blood pressure  HPI:  Aaron Keller is a 71 y.o. year-old male with history of coronary artery disease, sick sinus syndrome status post dual-chamber pacemaker, hypertension, stroke, and encephalitis, who presents for follow-up of hypertension. I last saw him in September, at which time his blood pressure remained suboptimally controlled. Subsequent renal artery Doppler did not show any evidence of renal artery stenosis. He underwent sleep study last night (results still pending). Home blood pressure has remained somewhat elevated, typically 130's-150's/70's-90's. Aaron Keller has felt well without chest pain, shortness of breath, palpitations, lightheadedness, and edema. He denies any issues with his pacemaker. He has been compliant with is medications. He does not exercise on a regular basis.  --------------------------------------------------------------------------------------------------  Cardiovascular History & Procedures: Cardiovascular Problems:  Sinus arrest with junctional bradycardia in the setting of beta-blocker use  Stroke (2002)  Coronary artery disease  Risk Factors:  Known CAD, stroke, hypertension, male gender, and age > 74  Cath/PCI:  LHC (04/01/16): Mild ectasia of the proximal LAD. Distal LAD diffusely diseased with focal area of 90% stenosis. Dominant LCx with diffuse ectasia and diffuse 50% stenosis of lPDA. 60% mid RCA lesion. Normal LV contraction. Mildly elevated LVEDP.  CV Surgery:  None  EP Procedures and Devices:  Dual chamber pacemaker (06/28/16, Dr. Rayann Heman): St. Jude Assurity  30 day event monitor (05/03/16): Patient was monitored for 30 days with predominately normal sinus rhythm (average rate 65 bpm (range 38-104 bpm). Sinus pauses with junctional escapes noted both at night and  during the daytime (longest RR interval 3.8 seconds). Rare PACs noted.  Temporary transvenous pacemaker (04/01/16)  Non-Invasive Evaluation(s):  Exercise tolerance test (04/28/16): Patient exercised 5 minutes, 0 seconds (7 METS) achieving a maximal heart rate of 115 bpm (76% MPHR). No significant ST segment changes were noted. Test inconclusive for ischemia due to target heart rate not having been achieved.  Transthoracic echocardiogram (04/02/16): Normal LV size with moderate LVH. LVEF 65-70% with normal wall motion. Grade 1 diastolic dysfunction. Mild aortic regurgitation. Normal RV size and function.  Recent CV Pertinent Labs: Lab Results  Component Value Date   CHOL 102 04/02/2016   HDL 40 (L) 04/02/2016   LDLCALC 43 04/02/2016   TRIG 97 04/02/2016   CHOLHDL 2.6 04/02/2016   INR 0.99 04/01/2016   K 4.4 06/28/2016   MG 2.1 04/01/2016   BUN 8 06/28/2016   BUN 7 (L) 04/28/2016   CREATININE 0.70 06/28/2016    Past medical and surgical history were reviewed and updated in EPIC.  Current Meds  Medication Sig  . amLODipine (NORVASC) 10 MG tablet Take 10 mg by mouth daily.  Marland Kitchen aspirin EC 81 MG tablet Take 81 mg by mouth daily.  Marland Kitchen atorvastatin (LIPITOR) 10 MG tablet Take 1 tablet (10 mg total) by mouth daily.  . carbamazepine (TEGRETOL) 200 MG tablet Take 200 mg by mouth 4 (four) times daily.   . carvedilol (COREG) 12.5 MG tablet Take 1 tablet (12.5 mg total) by mouth 2 (two) times daily.  . citalopram (CELEXA) 40 MG tablet Take 40 mg by mouth daily.    . clorazepate (TRANXENE-T) 3.75 MG tablet Take 3.75 mg by mouth 2 (two) times daily.    Marland Kitchen dipyridamole-aspirin (AGGRENOX) 200-25 MG 12hr capsule Take 2 capsules by mouth at bedtime.   . fluticasone (FLONASE) 50 MCG/ACT nasal spray Place 2 sprays  into both nostrils at bedtime as needed for allergies or rhinitis.  . folic acid (FOLVITE) 1 MG tablet Take 1 mg by mouth daily.    Marland Kitchen gabapentin (NEURONTIN) 800 MG tablet Take 800 mg by  mouth 2 (two) times daily.  Marland Kitchen losartan (COZAAR) 100 MG tablet Take 100 mg by mouth daily.  . nitroGLYCERIN (NITROSTAT) 0.4 MG SL tablet Place 1 tablet (0.4 mg total) under the tongue every 5 (five) minutes x 3 doses as needed for chest pain.    Allergies: Cyclosporine and Serzone [nefazodone hcl]  Social History   Socioeconomic History  . Marital status: Married    Spouse name: Not on file  . Number of children: Not on file  . Years of education: Not on file  . Highest education level: Not on file  Social Needs  . Financial resource strain: Not on file  . Food insecurity - worry: Not on file  . Food insecurity - inability: Not on file  . Transportation needs - medical: Not on file  . Transportation needs - non-medical: Not on file  Occupational History  . Not on file  Tobacco Use  . Smoking status: Former Smoker    Years: 1.00  . Smokeless tobacco: Never Used  . Tobacco comment: 06/29/2016 "smoked for ~ 1 yr while in the service"  Substance and Sexual Activity  . Alcohol use: No  . Drug use: No  . Sexual activity: No  Other Topics Concern  . Not on file  Social History Narrative  . Not on file    Family History  Problem Relation Age of Onset  . Heart attack Mother        in her 35's  . Heart disease Mother        skipped beats  . Cancer Father        liver    Review of Systems: A 12-system review of systems was performed and was negative except as noted in the HPI.  --------------------------------------------------------------------------------------------------  Physical Exam: BP 128/82   Pulse 60   Ht 5\' 5"  (1.651 m)   Wt 188 lb (85.3 kg)   SpO2 97%   BMI 31.28 kg/m   General:  Obese man, seated comfortably in the exam room. He is accompanied by his wife. HEENT: No conjunctival pallor or scleral icterus. Moist mucous membranes.  OP clear. Neck: Supple without lymphadenopathy, thyromegaly, JVD, or HJR. Lungs: Normal work of breathing. Clear to  auscultation bilaterally without wheezes or crackles. Heart: Regular rate and rhythm without murmurs, rubs, or gallops. Non-displaced PMI. Abd: Bowel sounds present. Soft, NT/ND without hepatosplenomegaly Ext: No lower extremity edema. Radial, PT, and DP pulses are 2+ bilaterally. Skin: Warm and dry without rash.  Lab Results  Component Value Date   WBC 5.4 06/28/2016   HGB 14.1 06/28/2016   HCT 39.1 06/28/2016   MCV 86.3 06/28/2016   PLT 124 (L) 06/28/2016    Lab Results  Component Value Date   NA 135 06/28/2016   K 4.4 06/28/2016   CL 98 (L) 06/28/2016   CO2 27 06/28/2016   BUN 8 06/28/2016   CREATININE 0.70 06/28/2016   GLUCOSE 94 06/28/2016   ALT 18 04/01/2016    Lab Results  Component Value Date   CHOL 102 04/02/2016   HDL 40 (L) 04/02/2016   LDLCALC 43 04/02/2016   TRIG 97 04/02/2016   CHOLHDL 2.6 04/02/2016    --------------------------------------------------------------------------------------------------  ASSESSMENT AND PLAN: Hypertension Blood pressure is reasonably well-controlled, though diastolic  reading is borderline elevated today. Home readings have typically been mildly elevated. Aaron Keller is tolerating his current regimen of carvedilol, amlodipine, and losartan well. He did not tolerate thiazide diuretics in the past due to electrolyte disturbances. We discussed adding spironolactone but have agreed to defer this pending the results of the sleep study. If OSA is identified, it is quite possible that his blood pressure will improve with CPAP.  Coronary artery disease without angina No symptoms to suggest progression of CAD. Continue aggressive secondary prevention.  Sick sinus syndrome Status post PPM. Continue follow-up with the device clinic.  Hyperlipidemia Goal LDL < 70. Continue atorvastatin 10 mg daily; most recent LDL in 03/2016 was 43.  Follow-up: Return to clinic in 3 months.  Nelva Bush, MD 03/03/2017 8:06 AM

## 2017-03-09 NOTE — Procedures (Signed)
NAME: Aaron Keller DATE OF BIRTH:  [birth year] MEDICAL RECORD NUMBER [id number]  LOCATION: [facility]  PHYSICIAN: Naeem Quillin  DATE OF STUDY: 03/01/2017  SLEEP STUDY TYPE: Positive Airway Pressure Titration               REFERRING PHYSICIAN: End, Harrell Gave, MD   HEIGHT: 5' 5" (165.1 cm)  WEIGHT: 188 lb (85.3 kg)    Body mass index is 31.28 kg/m.  NECK SIZE: 16 in.  CLINICAL INFORMATION Sleep Study Type: Split Night CPAP  Indication for sleep study: Snoring  SLEEP STUDY TECHNIQUE As per the AASM Manual for the Scoring of Sleep and Associated Events v2.3 (April 2016) with a hypopnea requiring 4% desaturations.  The channels recorded and monitored were frontal, central and occipital EEG, electrooculogram (EOG), submentalis EMG (chin), nasal and oral airflow, thoracic and abdominal wall motion, anterior tibialis EMG, snore microphone, electrocardiogram, and pulse oximetry. Continuous positive airway pressure (CPAP) was initiated when the patient met split night criteria and was titrated according to treat sleep-disordered breathing.  MEDICATIONS Medications self-administered by patient taken the night of the study : AGGRENOX, TEGRETOL  RESPIRATORY PARAMETERS Diagnostic Total AHI (/hr): 16.6  RDI (/hr):21.9  OA Index (/hr): 0.4  CA Index (/hr): 0.0 REM AHI (/hr): 60.0  NREM AHI (/hr):12.7  Supine AHI (/hr):16.6  Non-supine AHI (/hr):N/A Min O2 Sat (%):81.00  Mean O2 (%): 95.46  Time below 88% (min):1.3   Titration Optimal Pressure (cm):12  AHI at Optimal Pressure (/hr):0.0  Min O2 at Optimal Pressure (%):94.0 Supine % at Optimal (%):100  Sleep % at Optimal (%):17   SLEEP ARCHITECTURE The recording time for the entire night was 364.4 minutes.  During a baseline period of 189.7 minutes, the patient slept for 134.0 minutes in REM and nonREM, yielding a sleep efficiency of 70.7%. Sleep onset after lights out was 24.0 minutes with a REM  latency of 101.0 minutes. The patient spent 0.75% of the night in stage N1 sleep, 91.04% in stage N2 sleep, 0.00% in stage N3 and 8.21% in REM.  During the titration period of 172.7 minutes, the patient slept for 113.5 minutes in REM and nonREM, yielding a sleep efficiency of 65.7%. Sleep onset after CPAP initiation was 13.2 minutes with a REM latency of 74.5 minutes. The patient spent 0.88% of the night in stage N1 sleep, 90.75% in stage N2 sleep, 0.44% in stage N3 and 7.93% in REM.  CARDIAC DATA The 2 lead EKG demonstrated sinus rhythm. The mean heart rate was 60.95 beats per minute. Other EKG findings include: None.  LEG MOVEMENT DATA The total Periodic Limb Movements of Sleep (PLMS) were 236. The PLMS index was 57.21 .  IMPRESSIONS - Moderate obstructive sleep apnea occurred during the diagnostic portion of the study(AHI = 16.6/hour). An optimal PAP pressure was selected for this patient ( 12 cm of water) - No significant central sleep apnea occurred during the diagnostic portion of the study (CAI = 0.0/hour). - The patient had oxygen desaturation during the diagnostic portion of the study (Min O2 = 81.00%) - The patient snored with moderate snoring volume during the diagnostic portion of the study. - No cardiac abnormalities were noted during this study. - Severe periodic limb movements of sleep occurred during the study.  DIAGNOSIS - Obstructive Sleep Apnea (327.23 [G47.33 ICD-10])  RECOMMENDATIONS - Trial of CPAP therapy on 12 cm H2O with a Small size Fisher&Paykel Full Face Mask Simplus mask and heated humidification. - Avoid alcohol, sedatives  and other CNS depressants that may worsen sleep apnea and disrupt normal sleep architecture. - Sleep hygiene should be reviewed to assess factors that may improve sleep quality. - Weight management and regular exercise should be initiated or continued. - Return to Sleep Center for re-evaluation after 10 weeks of therapy  [place], [facility]  ELECTRONICALLY SIGNED ON:  03/09/2017, 10:38 PM [place] PH: [phone]   FX: [phone] [place]

## 2017-03-13 ENCOUNTER — Telehealth: Payer: Self-pay | Admitting: *Deleted

## 2017-03-13 NOTE — Telephone Encounter (Signed)
-----   Message from Sueanne Margarita, MD sent at 03/09/2017 10:42 PM EST ----- Please let patient know that they have significant sleep apnea and had successful CPAP titration and will be set up with CPAP unit.  Please let DME know that order is in EPIC.  Please set patient up for OV in 10 weeks

## 2017-03-13 NOTE — Telephone Encounter (Signed)
Informed patient of sleep study results and patient understanding was verbalized. Patient understands he has sleep apnea and had a successful CPAP titration and Dr Radford Pax has ordered him a CPAP. Patient understands he will be contacted by [facility] to set up his cpap. He understands to call if [facility] does not contact his with new setup in a timely manner. He understands he will be called once confirmation has been received from [facility] that he has received her new machine to schedule 10 week follow up appointment.  [facility] notified of new cpap order  Please add to Maryfrances Bunnell He was grateful for the call and thanked me.

## 2017-04-10 ENCOUNTER — Ambulatory Visit (INDEPENDENT_AMBULATORY_CARE_PROVIDER_SITE_OTHER): Payer: BLUE CROSS/BLUE SHIELD | Admitting: *Deleted

## 2017-04-10 DIAGNOSIS — I495 Sick sinus syndrome: Secondary | ICD-10-CM

## 2017-04-10 NOTE — Progress Notes (Signed)
Remote pacemaker transmission.   

## 2017-04-11 ENCOUNTER — Encounter: Payer: Self-pay | Admitting: Cardiology

## 2017-05-06 LAB — CUP PACEART REMOTE DEVICE CHECK
Battery Remaining Percentage: 95.5 %
Brady Statistic AP VP Percent: 1 %
Brady Statistic AP VS Percent: 85 %
Brady Statistic RA Percent Paced: 85 %
Brady Statistic RV Percent Paced: 1 %
Date Time Interrogation Session: 20190121070019
Implantable Lead Implant Date: 20180410
Implantable Lead Implant Date: 20180410
Implantable Lead Location: 753860
Implantable Pulse Generator Implant Date: 20180410
Lead Channel Impedance Value: 540 Ohm
Lead Channel Pacing Threshold Amplitude: 0.5 V
Lead Channel Sensing Intrinsic Amplitude: 1.4 mV
Lead Channel Setting Pacing Amplitude: 2 V
Lead Channel Setting Pacing Amplitude: 2.5 V
Lead Channel Setting Pacing Pulse Width: 0.5 ms
Lead Channel Setting Sensing Sensitivity: 2 mV
MDC IDC LEAD LOCATION: 753859
MDC IDC MSMT BATTERY REMAINING LONGEVITY: 111 mo
MDC IDC MSMT BATTERY VOLTAGE: 3.01 V
MDC IDC MSMT LEADCHNL RA IMPEDANCE VALUE: 450 Ohm
MDC IDC MSMT LEADCHNL RA PACING THRESHOLD AMPLITUDE: 0.5 V
MDC IDC MSMT LEADCHNL RA PACING THRESHOLD PULSEWIDTH: 0.5 ms
MDC IDC MSMT LEADCHNL RV PACING THRESHOLD PULSEWIDTH: 0.5 ms
MDC IDC MSMT LEADCHNL RV SENSING INTR AMPL: 8.2 mV
MDC IDC STAT BRADY AS VP PERCENT: 1 %
MDC IDC STAT BRADY AS VS PERCENT: 15 %
Pulse Gen Model: 2272
Pulse Gen Serial Number: 8016790

## 2017-05-24 ENCOUNTER — Encounter: Payer: Self-pay | Admitting: Internal Medicine

## 2017-06-01 ENCOUNTER — Encounter: Payer: Self-pay | Admitting: *Deleted

## 2017-06-01 ENCOUNTER — Ambulatory Visit (INDEPENDENT_AMBULATORY_CARE_PROVIDER_SITE_OTHER): Payer: Medicare Other | Admitting: Internal Medicine

## 2017-06-01 ENCOUNTER — Encounter: Payer: Self-pay | Admitting: Internal Medicine

## 2017-06-01 VITALS — BP 126/86 | HR 92 | Ht 65.0 in | Wt 191.0 lb

## 2017-06-01 DIAGNOSIS — I495 Sick sinus syndrome: Secondary | ICD-10-CM

## 2017-06-01 DIAGNOSIS — I10 Essential (primary) hypertension: Secondary | ICD-10-CM

## 2017-06-01 DIAGNOSIS — I2510 Atherosclerotic heart disease of native coronary artery without angina pectoris: Secondary | ICD-10-CM | POA: Diagnosis not present

## 2017-06-01 DIAGNOSIS — E785 Hyperlipidemia, unspecified: Secondary | ICD-10-CM | POA: Diagnosis not present

## 2017-06-01 MED ORDER — LISINOPRIL 40 MG PO TABS: ORAL_TABLET | ORAL | 3 refills | Status: AC

## 2017-06-01 NOTE — Patient Instructions (Addendum)
Medication Instructions:  Stop Losartan  Wait 1 day  Start Lisinopril 40 mg daily by mouth  -- If you need a refill on your cardiac medications before your next appointment, please call your pharmacy. --  Labwork: BMET in 2 Weeks  Testing/Procedures: None ordered  Follow-Up: Your physician wants you to follow-up in: 6 months with Dr. Saunders Revel.    You will receive a reminder letter in the mail two months in advance. If you don't receive a letter, please call our office to schedule the follow-up appointment.  Thank you for choosing [facility]!!    Any Other Special Instructions Will Be Listed Below (If Applicable).  BP CHECK in HTN clinic in 2 weeks

## 2017-06-01 NOTE — Progress Notes (Signed)
Follow-up Outpatient Visit Date: 06/01/2017  Primary Care Provider: [place]y [place]ras, [place] [id number]  Chief Complaint: Elevated blood pressure  HPI:  Aaron [place] is a 72 y.o. year-old male with history of CAD, SSS s/p dual-chamber PPM, HTN, CVA, and encephalitis, who presents for follow-up of CAD and HTN. I last saw Aaron [place] 3 months ago, at which time he reported modestly elevated blood pressure at home. He was otherwise asymptomatic. Sleep study performed last fall revealed sleep apnea, and Aaron [place] has been started on CPAP by Dr. Radford Pax and typically wears it for at least 4-5 hours a night.  Mr. Aaron [place] notes that his home blood pressures have remained suboptimally controlled, usually 681-275 mmHg systolic. He feels well, denying chest pain, shortness of breath, palpitations, lightheadedness, and edema. He is compliant with his medications and has not experienced any side effects. He recently received a letter from [facility] stating that his losartan is currently under recall.  --------------------------------------------------------------------------------------------------  Cardiovascular History & Procedures: Cardiovascular Problems:  Sinus arrest with junctional bradycardia in the setting of beta-blocker use  Stroke (2002)  Coronary artery disease  Risk Factors:  Known CAD, stroke, hypertension, male gender, and age > 47  Cath/PCI:  LHC (04/01/16): Mild ectasia of the proximal LAD. Distal LAD diffusely diseased with focal area of 90% stenosis. Dominant LCx with diffuse ectasia and diffuse 50% stenosis of lPDA. 60% mid RCA lesion. Normal LV contraction. Mildly elevated LVEDP.  CV Surgery:  None  EP Procedures and Devices:  Dual chamber pacemaker (06/28/16, Dr. Rayann Heman): [employer]. Jude Assurity  30 day event monitor (05/03/16): Patient was monitored for 30 days with predominately normal sinus rhythm (average rate 65 bpm  (range 38-104 bpm). Sinus pauses with junctional escapes noted both at night and during the daytime (longest RR interval 3.8 seconds). Rare PACs noted.  Temporary transvenous pacemaker (04/01/16)  Non-Invasive Evaluation(s):  Exercise tolerance test (04/28/16): Patient exercised 5 minutes, 0 seconds (7 METS) achieving a maximal heart rate of 115 bpm (76% MPHR). No significant ST segment changes were noted. Test inconclusive for ischemia due to target heart rate not having been achieved.  Transthoracic echocardiogram (04/02/16): Normal LV size with moderate LVH. LVEF 65-70% with normal wall motion. Grade 1 diastolic dysfunction. Mild aortic regurgitation. Normal RV size and function.  Recent CV Pertinent Labs: Lab Results  Component Value Date   CHOL 102 04/02/2016   HDL 40 (L) 04/02/2016   LDLCALC 43 04/02/2016   TRIG 97 04/02/2016   CHOLHDL 2.6 04/02/2016   INR 0.99 04/01/2016   K 4.4 06/28/2016   MG 2.1 04/01/2016   BUN 8 06/28/2016   BUN 7 (L) 04/28/2016   CREATININE 0.70 06/28/2016    Past medical and surgical history were reviewed and updated in EPIC.  Current Meds  Medication Sig  . amLODipine (NORVASC) 10 MG tablet Take 10 mg by mouth daily.  Marland Kitchen aspirin EC 81 MG tablet Take 81 mg by mouth daily.  Marland Kitchen atorvastatin (LIPITOR) 10 MG tablet Take 1 tablet (10 mg total) by mouth daily.  . carbamazepine (TEGRETOL) 200 MG tablet Take 200 mg by mouth 4 (four) times daily.   . carvedilol (COREG) 12.5 MG tablet Take 1 tablet (12.5 mg total) by mouth 2 (two) times daily.  . clorazepate (TRANXENE-T) 3.75 MG tablet Take 3.75 mg by mouth 2 (two) times daily.    Marland Kitchen dipyridamole-aspirin (AGGRENOX) 200-25 MG 12hr capsule Take 2 capsules by mouth at bedtime.   Marland Kitchen  fluticasone (FLONASE) 50 MCG/ACT nasal spray Place 2 sprays into both nostrils at bedtime as needed for allergies or rhinitis.  . folic acid (FOLVITE) 1 MG tablet Take 1 mg by mouth daily.    Marland Kitchen gabapentin (NEURONTIN) 800 MG tablet Take  800 mg by mouth 2 (two) times daily.  . nitroGLYCERIN (NITROSTAT) 0.4 MG SL tablet Place 1 tablet (0.4 mg total) under the tongue every 5 (five) minutes x 3 doses as needed for chest pain.  . [DISCONTINUED] losartan (COZAAR) 100 MG tablet Take 100 mg by mouth daily.    Allergies: Cyclosporine and Serzone [nefazodone hcl]  Social History   Socioeconomic History  . Marital status: Married    Spouse name: Not on file  . Number of children: Not on file  . Years of education: Not on file  . Highest education level: Not on file  Social Needs  . Financial resource strain: Not on file  . Food insecurity - worry: Not on file  . Food insecurity - inability: Not on file  . Transportation needs - medical: Not on file  . Transportation needs - non-medical: Not on file  Occupational History  . Not on file  Tobacco Use  . Smoking status: Former Smoker    Years: 1.00  . Smokeless tobacco: Never Used  . Tobacco comment: 06/29/2016 "smoked for ~ 1 yr while in the service"  Substance and Sexual Activity  . Alcohol use: No  . Drug use: No  . Sexual activity: No  Other Topics Concern  . Not on file  Social History Narrative  . Not on file    Family History  Problem Relation Age of Onset  . Heart attack Mother        in her 68's  . Heart disease Mother        skipped beats  . Cancer Father        liver    Review of Systems: A 12-system review of systems was performed and was negative except as noted in the HPI.  --------------------------------------------------------------------------------------------------  Physical Exam: BP 126/86   Pulse 92   Ht 5\' 5"  (1.651 m)   Wt 191 lb (86.6 kg)   SpO2 96%   BMI 31.78 kg/m   General:  NAD HEENT: No conjunctival pallor or scleral icterus. Moist mucous membranes.  OP clear. Neck: Supple without lymphadenopathy, thyromegaly, JVD, or HJR. Lungs: Normal work of breathing. Clear to auscultation bilaterally without wheezes or  crackles. Heart: Regular rate and rhythm without murmurs, rubs, or gallops. Non-displaced PMI. Abd: Bowel sounds present. Soft, NT/ND without hepatosplenomegaly Ext: No lower extremity edema.pulses are 2+ bilaterally. Skin: Warm and dry without rash.  Lab Results  Component Value Date   WBC 5.4 06/28/2016   HGB 14.1 06/28/2016   HCT 39.1 06/28/2016   MCV 86.3 06/28/2016   PLT 124 (L) 06/28/2016    Lab Results  Component Value Date   NA 135 06/28/2016   K 4.4 06/28/2016   CL 98 (L) 06/28/2016   CO2 27 06/28/2016   BUN 8 06/28/2016   CREATININE 0.70 06/28/2016   GLUCOSE 94 06/28/2016   ALT 18 04/01/2016    Lab Results  Component Value Date   CHOL 102 04/02/2016   HDL 40 (L) 04/02/2016   LDLCALC 43 04/02/2016   TRIG 97 04/02/2016   CHOLHDL 2.6 04/02/2016    --------------------------------------------------------------------------------------------------  ASSESSMENT AND PLAN: Essential hypertension BP in the office today is upper normal to mildly elevated (particlarly diastolic). Home  readings have been higher. Given recall on losartan, we have agreed to switch Mr. [place] to lisinopril 40 mg daily. We will defer making any further medication changes today. I am hopeful that continued CPAP use will also help his pressure. We will have him return in 2 weeks for a BMP and follow-up in the hypertension clinic. If his blood pressure is suboptimally controlled at that time, addition of spironolactone will need to be considered. Unfortunately, we he experienced hyponatremia while on HCTZ.  Coronary artery disease without angina No chest pain or shortness of breath. We will continue medical management.  Sick sinus syndrome No symptomatic bradycardia following dual chamber pacemaker placement last year by Dr. Rayann Heman. Continue routine follow-up in the device clinic.  Hyperlipidemia Goal LDL < 70; most recent LDL was excellent at 51 in 12/2016. We will continue current dose of  atorvastatin.  Follow-up: Return to hypertension clinic in 2 weeks; see me in 6 months.  Nelva Bush, MD 06/02/2017 3:09 PM

## 2017-06-02 ENCOUNTER — Encounter: Payer: Self-pay | Admitting: Internal Medicine

## 2017-06-06 ENCOUNTER — Telehealth: Payer: Self-pay | Admitting: *Deleted

## 2017-06-06 NOTE — Telephone Encounter (Signed)
Me    Patient got set up on March 20, 2017. Patient has a 10 week follow up appointment scheduled for June 15 2017. Patient understands he needs to keep this appointment for insurance compliance. Patient was grateful for the call and thanked me.     March 15, 2017

## 2017-06-06 NOTE — Telephone Encounter (Signed)
Patient got set up on March 20, 2017. Patient has a 10 week follow up appointment scheduled for June 15 2017. Patient understands he needs to keep this appointment for insurance compliance. Patient was grateful for the call and thanked me.

## 2017-06-15 ENCOUNTER — Encounter: Payer: Self-pay | Admitting: Cardiology

## 2017-06-15 ENCOUNTER — Ambulatory Visit (INDEPENDENT_AMBULATORY_CARE_PROVIDER_SITE_OTHER): Payer: Medicare Other | Admitting: Cardiology

## 2017-06-15 VITALS — BP 152/80 | HR 60 | Ht 65.0 in | Wt 188.6 lb

## 2017-06-15 DIAGNOSIS — E669 Obesity, unspecified: Secondary | ICD-10-CM

## 2017-06-15 DIAGNOSIS — I10 Essential (primary) hypertension: Secondary | ICD-10-CM | POA: Diagnosis not present

## 2017-06-15 DIAGNOSIS — G4733 Obstructive sleep apnea (adult) (pediatric): Secondary | ICD-10-CM

## 2017-06-15 DIAGNOSIS — I2510 Atherosclerotic heart disease of native coronary artery without angina pectoris: Secondary | ICD-10-CM

## 2017-06-15 HISTORY — DX: Obesity, unspecified: E66.9

## 2017-06-15 HISTORY — DX: Obstructive sleep apnea (adult) (pediatric): G47.33

## 2017-06-15 NOTE — Progress Notes (Signed)
Cardiology Office Note:    Date:  06/15/2017   ID:  Aaron [place], DOB Feb 24, 1946, MRN [id number]  PCP:  Aaron Contras, MD  Cardiologist:  Aaron Bush, MD    Referring MD: Aaron Contras, MD   Chief Complaint  Patient presents with  . Sleep Apnea  . Hypertension    History of Present Illness:    Aaron [place] is a 72 y.o. male with a hx of CAD, hypertension, sick sinus syndrome status post permanent pacemaker placement who was referred for split-night sleep study by Dr. Saunders Revel.  He was having problems with snoring, poorly controlled HTN and obesity.  He underwent PSG showing moderate obstructive sleep apnea with an AHI of 16.6/h.  He underwent CPAP titration to 12 cm of water pressure.  He is now here for follow-up.  He is doing well with his CPAP device and thinks that he has gotten used to it.  He tolerates the full face mask and feels the pressure is adequate.  Since going on CPAP He feels rested in the am but is still napping some during the day.  He goes to bed at erratically and usually goes to bed around 1-2am but had already been asleep in the den watching TV without his CPAP.  He denies any significant mouth or nasal dryness or nasal congestion.  He does not think that he snores.     Past Medical History:  Diagnosis Date  . CAD in native artery- non obstructive on cath  04/04/2016  . Encephalitis 02/18/1985  . Hepatitis ~ 1959   "don't know what kind"  . HTN (hypertension) 04/04/2016  . Hx of tonic-clonic seizures 02/18/1985 X 1  . Hypertension   . Nasal bleeding    "because of one of the RX I was on" (06/29/2016)  . Obesity (BMI 30-39.9) 06/15/2017  . OSA (obstructive sleep apnea) 06/15/2017   Moderate with AHI 16.6/hr now on CPAP at 12cm H2O  . Presence of permanent cardiac pacemaker   . Seasonal allergies   . Seizures ([place]) 02/18/1985 X 1   "related to encephalitis"  . Sick sinus syndrome ([place])   . Stroke [facility])    "I've had 2 minor ones"; denies residual on  06/28/2016  . Symptomatic bradycardia 03/2016    Past Surgical History:  Procedure Laterality Date  . APPENDECTOMY    . CARDIAC CATHETERIZATION N/A 04/01/2016   Procedure: Left Heart Cath and Coronary Angiography;  Surgeon: Peter M Martinique, MD;  Location: [facility];  Service: Cardiovascular;  Laterality: N/A;  . CARDIAC CATHETERIZATION N/A 04/01/2016   Procedure: Temporary Pacemaker;  Surgeon: Peter M Martinique, MD;  Location: [place] CV LAB;  Service: Cardiovascular;  Laterality: N/A;  . CATARACT EXTRACTION W/ INTRAOCULAR LENS  IMPLANT, BILATERAL Bilateral ~ 2007  . ELBOW SURGERY Right   . FRACTURE SURGERY    . INGUINAL HERNIA REPAIR Right 08/2010  . PACEMAKER IMPLANT N/A 06/28/2016   Procedure: Pacemaker Implant;  Surgeon: Thompson Grayer, MD;  Location: [facility] CV LAB;  Service: Cardiovascular;  Laterality: N/A;  . PACEMAKER IMPLANT  06/28/2016   SJM Assurity MRI PPM implanted by Dr Rayann Heman for sick sinus syndrome  . WRIST FRACTURE SURGERY Left 1950s    Current Medications: Current Meds  Medication Sig  . amLODipine (NORVASC) 10 MG tablet Take 10 mg by mouth daily.  Marland Kitchen aspirin EC 81 MG tablet Take 81 mg by mouth daily.  Marland Kitchen atorvastatin (LIPITOR) 10 MG tablet Take 1 tablet (10  mg total) by mouth daily.  . carbamazepine (TEGRETOL) 200 MG tablet Take 200 mg by mouth 4 (four) times daily.   . carvedilol (COREG) 12.5 MG tablet Take 1 tablet (12.5 mg total) by mouth 2 (two) times daily.  . clorazepate (TRANXENE-T) 3.75 MG tablet Take 3.75 mg by mouth 2 (two) times daily.    Marland Kitchen dipyridamole-aspirin (AGGRENOX) 200-25 MG 12hr capsule Take 2 capsules by mouth at bedtime.   . fluticasone (FLONASE) 50 MCG/ACT nasal spray Place 2 sprays into both nostrils at bedtime as needed for allergies or rhinitis.  . folic acid (FOLVITE) 1 MG tablet Take 1 mg by mouth daily.    Marland Kitchen gabapentin (NEURONTIN) 800 MG tablet Take 800 mg by mouth 2 (two) times daily.  Marland Kitchen lisinopril (PRINIVIL,ZESTRIL) 40 MG  tablet Take 1 tablet by mouth daily  . nitroGLYCERIN (NITROSTAT) 0.4 MG SL tablet Place 1 tablet (0.4 mg total) under the tongue every 5 (five) minutes x 3 doses as needed for chest pain.     Allergies:   Cyclosporine and Serzone [nefazodone hcl]   Social History   Socioeconomic History  . Marital status: Married    Spouse name: Not on file  . Number of children: Not on file  . Years of education: Not on file  . Highest education level: Not on file  Occupational History  . Not on file  Social Needs  . Financial resource strain: Not on file  . Food insecurity:    Worry: Not on file    Inability: Not on file  . Transportation needs:    Medical: Not on file    Non-medical: Not on file  Tobacco Use  . Smoking status: Former Smoker    Years: 1.00  . Smokeless tobacco: Never Used  . Tobacco comment: 06/29/2016 "smoked for ~ 1 yr while in the service"  Substance and Sexual Activity  . Alcohol use: No  . Drug use: No  . Sexual activity: Never  Lifestyle  . Physical activity:    Days per week: Not on file    Minutes per session: Not on file  . Stress: Not on file  Relationships  . Social connections:    Talks on phone: Not on file    Gets together: Not on file    Attends religious service: Not on file    Active member of club or organization: Not on file    Attends meetings of clubs or organizations: Not on file    Relationship status: Not on file  Other Topics Concern  . Not on file  Social History Narrative  . Not on file     Family History: The patient's family history includes Cancer in his father; Heart attack in his mother; Heart disease in his mother.  ROS:   Please see the history of present illness.    ROS  All other systems reviewed and negative.   EKGs/Labs/Other Studies Reviewed:    The following studies were reviewed today: PAP download  EKG:  EKG is ordered today and showed NSR at 60bpm and LAFB  Recent Labs: 06/28/2016: BUN 8; Creatinine, Ser  0.70; Hemoglobin 14.1; Platelets 124; Potassium 4.4; Sodium 135   Recent Lipid Panel    Component Value Date/Time   CHOL 102 04/02/2016 0500   TRIG 97 04/02/2016 0500   HDL 40 (L) 04/02/2016 0500   CHOLHDL 2.6 04/02/2016 0500   VLDL 19 04/02/2016 0500   LDLCALC 43 04/02/2016 0500    Physical Exam:  VS:  BP (!) 152/80   Pulse 60   Ht 5\' 5"  (1.651 m)   Wt 188 lb 9.6 oz (85.5 kg)   BMI 31.38 kg/m     Wt Readings from Last 3 Encounters:  06/15/17 188 lb 9.6 oz (85.5 kg)  06/01/17 191 lb (86.6 kg)  03/02/17 188 lb (85.3 kg)     GEN:  Well nourished, well developed in no acute distress HEENT: Normal NECK: No JVD; No carotid bruits LYMPHATICS: No lymphadenopathy CARDIAC: RRR, no murmurs, rubs, gallops RESPIRATORY:  Clear to auscultation without rales, wheezing or rhonchi  ABDOMEN: Soft, non-tender, non-distended MUSCULOSKELETAL:  No edema; No deformity  SKIN: Warm and dry NEUROLOGIC:  Alert and oriented x 3 PSYCHIATRIC:  Normal affect   ASSESSMENT:    1. OSA (obstructive sleep apnea)   2. Essential hypertension   3. Obesity (BMI 30-39.9)    PLAN:    In order of problems listed above:  1.  OSA - the patient is tolerating PAP therapy well without any problems. The PAP download was reviewed today and showed an AHI of 14.9/hr on 12 cm H2O with 67% compliance in using more than 4 hours nightly.  He sleeps a lot on the couch so sometimes does not have the mask on and falls short on compliance.  The patient has been using and benefiting from PAP use and will continue to benefit from therapy. His AHI is too high with no significant mask leak so I will get a 2 week autotitration from 4 to 18cm H2O.  I have encouraged him to avoid sleeping supine. I encouraged him to not sleep in the [facility] and when he gets sleepy to go to bed with his device.  2.  HTN - blood pressure borderline controlled on exam today.  He will continue on lisinopril 40 mg daily, amlodipine 10 mg daily and  carvedilol 12.5 mg twice daily.  3.  Obesity - I have encouraged him to get into a routine exercise program and cut back on carbs and portions.     Medication Adjustments/Labs and Tests Ordered: Current medicines are reviewed at length with the patient today.  Concerns regarding medicines are outlined above.  No orders of the defined types were placed in this encounter.  No orders of the defined types were placed in this encounter.   Signed, Fransico Him, MD  06/15/2017 2:39 PM    [place]

## 2017-06-15 NOTE — Patient Instructions (Signed)
Medication Instructions:  Your physician recommends that you continue on your current medications as directed. Please refer to the Current Medication list given to you today.  If you need a refill on your cardiac medications, please contact your pharmacy first.  Labwork: None ordered   Testing/Procedures: None ordered   Follow-Up: Your physician wants you to follow-up in: 1 year with Dr. Turner. You will receive a reminder letter in the mail two months in advance. If you don't receive a letter, please call our office to schedule the follow-up appointment.  Any Other Special Instructions Will Be Listed Below (If Applicable).   Thank you for choosing [facility]    Rena Marriana Hibberd, RN  [phone]  If you need a refill on your cardiac medications before your next appointment, please call your pharmacy.   

## 2017-06-16 ENCOUNTER — Telehealth: Payer: Self-pay | Admitting: *Deleted

## 2017-06-16 DIAGNOSIS — G4733 Obstructive sleep apnea (adult) (pediatric): Secondary | ICD-10-CM

## 2017-06-16 NOTE — Telephone Encounter (Signed)
cpap autotitration 4-18 cm H2O and D/L in 2 weeks. Order sent to [facility]

## 2017-06-16 NOTE — Telephone Encounter (Signed)
-----   Message from Teressa Senter, RN sent at 06/15/2017  2:57 PM EDT ----- Regarding: DME order  DME order placed.   Thanks  [employer]

## 2017-06-23 ENCOUNTER — Encounter: Payer: Self-pay | Admitting: Pharmacist

## 2017-06-23 ENCOUNTER — Ambulatory Visit (INDEPENDENT_AMBULATORY_CARE_PROVIDER_SITE_OTHER): Payer: Medicare Other | Admitting: Pharmacist

## 2017-06-23 ENCOUNTER — Other Ambulatory Visit: Payer: Medicare Other | Admitting: *Deleted

## 2017-06-23 VITALS — BP 142/72 | HR 61

## 2017-06-23 DIAGNOSIS — I2510 Atherosclerotic heart disease of native coronary artery without angina pectoris: Secondary | ICD-10-CM

## 2017-06-23 DIAGNOSIS — I10 Essential (primary) hypertension: Secondary | ICD-10-CM | POA: Diagnosis not present

## 2017-06-23 LAB — BASIC METABOLIC PANEL
BUN/Creatinine Ratio: 12 (ref 10–24)
BUN: 8 mg/dL (ref 8–27)
CO2: 26 mmol/L (ref 20–29)
CREATININE: 0.67 mg/dL — AB (ref 0.76–1.27)
Calcium: 8.8 mg/dL (ref 8.6–10.2)
Chloride: 96 mmol/L (ref 96–106)
GFR calc Af Amer: 113 mL/min/{1.73_m2} (ref >59)
GFR, EST NON AFRICAN AMERICAN: 97 mL/min/{1.73_m2} (ref >59)
GLUCOSE: 103 mg/dL — AB (ref 65–99)
Potassium: 4.5 mmol/L (ref 3.5–5.2)
SODIUM: 133 mmol/L — AB (ref 134–144)

## 2017-06-23 MED ORDER — SPIRONOLACTONE 25 MG PO TABS: 12.5000 mg | ORAL_TABLET | Freq: Every day | ORAL | 3 refills | Status: DC

## 2017-06-23 NOTE — Patient Instructions (Addendum)
BMET today.  START Spironolactone 12.5mg  DAILY (1/2 tablet daily)  Return for blood work in 1 week. Follow up in blood pressure clinic in 3-4 weeks.

## 2017-06-23 NOTE — Progress Notes (Signed)
Patient ID: [name]                 DOB: 05-10-1945                      MRN: [id number]     HPI: Aaron Keller is a 72 y.o. male patient of Dr. Saunders Keller who presents today for hypertension evaluation. PMH significant for CAD, SSS s/p dual-chamber PPM, HTN, CVA, and encephalitis. At his most recent OV with Dr. Saunders Keller he was changed from losartan to lisinopril due to recall. Will consider spironolactone today if pressure remains above goal.   He presents today with his wife for discussion of blood pressure. He states he did have one episode of dizziness when he moved very quickly a few days ago. He denies head aches, chest pain, and SOB. He and his wife have several questions about his medications and these have been answered. They were somewhat concerned about him being on 3 possibly 4 medications for one indication (BP) and we discussed at length that it is very common for people to need more than one medication to control pressures. They also raise concerns about how he should be sleeping using CPAP machine. He was told side was best then back was best. Advised he follow instructions of Aaron Keller who is his sleep apnea physician.   Current HTN meds:  Amlodipine 10mg  daily in the morning Carvedilol 12.5mg  BID Lisinopril 40mg  daily in the morning  Previously tried: HCTZ - hyponatremia, metoprolol - low blood pressure, losartan - recall  BP goal: <130/80  Family History: mother - MI in 54s, heart skipped beats; father - cancer  Social History: former smoker, denies alcohol use  Diet: Eats out some. He does not use salt at the table, but does add while cooking. He drinks diet coke mostly (couple glasses per day) and milk in the morning. Tea with supper.   Exercise: No formal exercise. He has been fairly sedentary recently.   Home BP readings: wrist 140s-150s/80s-90s HR 60s-70s  Wt Readings from Last 3 Encounters:  06/15/17 188 lb 9.6 oz (85.5 kg)  06/01/17 191 lb (86.6 kg)    03/02/17 188 lb (85.3 kg)   BP Readings from Last 3 Encounters:  06/23/17 (!) 142/72  06/15/17 (!) 152/80  06/01/17 126/86   Pulse Readings from Last 3 Encounters:  06/23/17 61  06/15/17 60  06/01/17 92    Renal function: CrCl cannot be calculated (Patient's most recent lab result is older than the maximum 21 days allowed.).  Past Medical History:  Diagnosis Date  . CAD in native artery- non obstructive on cath  04/04/2016  . Encephalitis 02/18/1985  . Hepatitis ~ 1959   "don't know what kind"  . HTN (hypertension) 04/04/2016  . Hx of tonic-clonic seizures 02/18/1985 X 1  . Hypertension   . Nasal bleeding    "because of one of the RX I was on" (06/29/2016)  . Obesity (BMI 30-39.9) 06/15/2017  . OSA (obstructive sleep apnea) 06/15/2017   Moderate with AHI 16.6/hr now on CPAP at 12cm H2O  . Presence of permanent cardiac pacemaker   . Seasonal allergies   . Seizures ([place]) 02/18/1985 X 1   "related to encephalitis"  . Sick sinus syndrome ([place])   . Stroke [facility])    "I've had 2 minor ones"; denies residual on 06/28/2016  . Symptomatic bradycardia 03/2016    Current Outpatient Medications on File Prior to Visit  Medication Sig  Dispense Refill  . amLODipine (NORVASC) 10 MG tablet Take 10 mg by mouth daily.    Marland Kitchen atorvastatin (LIPITOR) 10 MG tablet Take 1 tablet (10 mg total) by mouth daily. 30 tablet 6  . carbamazepine (TEGRETOL) 200 MG tablet Take 200 mg by mouth 4 (four) times daily.     . carvedilol (COREG) 12.5 MG tablet Take 1 tablet (12.5 mg total) by mouth 2 (two) times daily. 180 tablet 3  . citalopram (CELEXA) 40 MG tablet Take 40 mg by mouth daily.    . clorazepate (TRANXENE-T) 3.75 MG tablet Take 3.75 mg by mouth 2 (two) times daily.      Marland Kitchen dipyridamole-aspirin (AGGRENOX) 200-25 MG 12hr capsule Take 2 capsules by mouth at bedtime.     . fluticasone (FLONASE) 50 MCG/ACT nasal spray Place 2 sprays into both nostrils at bedtime as needed for allergies or rhinitis.    . folic  acid (FOLVITE) 1 MG tablet Take 1 mg by mouth daily.      Marland Kitchen gabapentin (NEURONTIN) 800 MG tablet Take 800 mg by mouth 2 (two) times daily.    Marland Kitchen lisinopril (PRINIVIL,ZESTRIL) 40 MG tablet Take 1 tablet by mouth daily 90 tablet 3  . nitroGLYCERIN (NITROSTAT) 0.4 MG SL tablet Place 1 tablet (0.4 mg total) under the tongue every 5 (five) minutes x 3 doses as needed for chest pain. 25 tablet 4   No current facility-administered medications on file prior to visit.     Allergies  Allergen Reactions  . Cyclosporine Other (See Comments)    Unknown  . Serzone [Nefazodone Hcl] Other (See Comments)    Unknown    Blood pressure (!) 142/72, pulse 61.   Assessment/Plan: Hypertension: BMET today after change to lisinopril. Will start low dose of spironolactone 12.5mg  daily in addition to current medication regimen. Repeat BMET in 1 week. Continue to monitor pressures. Follow up in HTN clinic in 2-3 weeks.    Thank you, Aaron Keller. Aaron Keller, Aaron Keller  06/23/2017 1:15 PM

## 2017-06-30 ENCOUNTER — Other Ambulatory Visit: Payer: Medicare Other

## 2017-06-30 DIAGNOSIS — I10 Essential (primary) hypertension: Secondary | ICD-10-CM | POA: Diagnosis not present

## 2017-07-01 LAB — BASIC METABOLIC PANEL
BUN/Creatinine Ratio: 13 (ref 10–24)
BUN: 9 mg/dL (ref 8–27)
CALCIUM: 8.6 mg/dL (ref 8.6–10.2)
CO2: 26 mmol/L (ref 20–29)
CREATININE: 0.67 mg/dL — AB (ref 0.76–1.27)
Chloride: 95 mmol/L — ABNORMAL LOW (ref 96–106)
GFR calc Af Amer: 113 mL/min/{1.73_m2} (ref >59)
GFR, EST NON AFRICAN AMERICAN: 97 mL/min/{1.73_m2} (ref >59)
Glucose: 80 mg/dL (ref 65–99)
Potassium: 5 mmol/L (ref 3.5–5.2)
SODIUM: 132 mmol/L — AB (ref 134–144)

## 2017-07-03 DIAGNOSIS — J0100 Acute maxillary sinusitis, unspecified: Secondary | ICD-10-CM | POA: Diagnosis not present

## 2017-07-05 ENCOUNTER — Other Ambulatory Visit: Payer: Self-pay

## 2017-07-05 DIAGNOSIS — L814 Other melanin hyperpigmentation: Secondary | ICD-10-CM | POA: Diagnosis not present

## 2017-07-05 DIAGNOSIS — L820 Inflamed seborrheic keratosis: Secondary | ICD-10-CM | POA: Diagnosis not present

## 2017-07-05 DIAGNOSIS — L738 Other specified follicular disorders: Secondary | ICD-10-CM | POA: Diagnosis not present

## 2017-07-05 DIAGNOSIS — R944 Abnormal results of kidney function studies: Secondary | ICD-10-CM

## 2017-07-05 DIAGNOSIS — D225 Melanocytic nevi of trunk: Secondary | ICD-10-CM | POA: Diagnosis not present

## 2017-07-05 DIAGNOSIS — L821 Other seborrheic keratosis: Secondary | ICD-10-CM | POA: Diagnosis not present

## 2017-07-06 NOTE — Telephone Encounter (Signed)
Called Sharon at [facility] to check status on the auto titration. Ivin Booty states she never received the order. Order has been re faxed. Will recheck in two weeks for the download.

## 2017-07-10 ENCOUNTER — Ambulatory Visit (INDEPENDENT_AMBULATORY_CARE_PROVIDER_SITE_OTHER): Payer: Medicare Other | Admitting: *Deleted

## 2017-07-10 DIAGNOSIS — I495 Sick sinus syndrome: Secondary | ICD-10-CM

## 2017-07-10 NOTE — Progress Notes (Signed)
Remote pacemaker transmission.   

## 2017-07-11 ENCOUNTER — Encounter: Payer: Self-pay | Admitting: Cardiology

## 2017-07-11 LAB — CUP PACEART REMOTE DEVICE CHECK
Date Time Interrogation Session: 20190423151058
Implantable Lead Implant Date: 20180410
Implantable Lead Location: 753859
MDC IDC LEAD IMPLANT DT: 20180410
MDC IDC LEAD LOCATION: 753860
MDC IDC PG IMPLANT DT: 20180410
MDC IDC PG SERIAL: 8016790
Pulse Gen Model: 2272

## 2017-07-19 ENCOUNTER — Ambulatory Visit (INDEPENDENT_AMBULATORY_CARE_PROVIDER_SITE_OTHER): Payer: Medicare Other | Admitting: Pharmacist

## 2017-07-19 VITALS — BP 124/78 | HR 63

## 2017-07-19 DIAGNOSIS — I2510 Atherosclerotic heart disease of native coronary artery without angina pectoris: Secondary | ICD-10-CM

## 2017-07-19 DIAGNOSIS — I10 Essential (primary) hypertension: Secondary | ICD-10-CM

## 2017-07-19 NOTE — Progress Notes (Signed)
Patient ID: Aaron Keller                 DOB: [birth year]                      MRN: [id number]     HPI: Aaron Keller is a 72 y.o. male patient of Dr. Saunders Keller who presents today for hypertension follow up and cholesterol discussion. PMH significant for CAD, SSS s/p dual-chamber PPM, HTN, CVA, and encephalitis. At his most recent OV in HTN clinic he was started on spironolactone. BMET 1 week later revealed normal kidney function, but potassium that was increased to ULN.     He presents today with his wife for discussion of cholesterol and blood pressure follow up. He reports no issues on current cholesterol medication (atorvastatin) and last lipids were at goal. Will continue all as prescribed.   He reports that still having issues with CPAP machine. He has been wearing about 5-6 hours per night. He states that it slips and he is constantly waking to readjust.   Current HTN meds:  Amlodipine 10mg  daily in the morning Carvedilol 12.5mg  BID Lisinopril 40mg  daily in the morning Spironolactone 12.5mg  daily in the morning   Current lipid medications:  Atorvastatin 10mg  daily   Intolerances: simvastatin 20mg  daily, atorvastatin 80mg  daily   Previously tried: HCTZ - hyponatremia, metoprolol - low blood pressure, losartan - recall, lisinopril (cough)  BP goal: <130/80  Family History: mother - MI in 55s, heart skipped beats; father - cancer  Social History: former smoker, denies alcohol use  Diet: Eats out some. He does not use salt at the table, but does add while cooking. He drinks diet coke mostly (couple glasses per day) and milk in the morning. Tea with supper.   Exercise: No formal exercise. He has been fairly sedentary recently.   Home BP readings: 123-158/67-89  Labs:  07/15/16: TC 135, TG 140, HDL 50, LDL 57 (atorvastatin 10mg  daily)  Wt Readings from Last 3 Encounters:  06/15/17 188 lb 9.6 oz (85.5 kg)  06/01/17 191 lb (86.6 kg)  03/02/17 188 lb (85.3 kg)   BP Readings  from Last 3 Encounters:  07/19/17 124/78  06/23/17 (!) 142/72  06/15/17 (!) 152/80   Pulse Readings from Last 3 Encounters:  07/19/17 63  06/23/17 61  06/15/17 60    Renal function: CrCl cannot be calculated (Unknown ideal weight.).  Past Medical History:  Diagnosis Date  . CAD in native artery- non obstructive on cath  04/04/2016  . Encephalitis 02/18/1985  . Hepatitis ~ 1959   "don't know what kind"  . HTN (hypertension) 04/04/2016  . Hx of tonic-clonic seizures 02/18/1985 X 1  . Hypertension   . Nasal bleeding    "because of one of the RX I was on" (06/29/2016)  . Obesity (BMI 30-39.9) 06/15/2017  . OSA (obstructive sleep apnea) 06/15/2017   Moderate with AHI 16.6/hr now on CPAP at 12cm H2O  . Presence of permanent cardiac pacemaker   . Seasonal allergies   . Seizures ([place]) 02/18/1985 X 1   "related to encephalitis"  . Sick sinus syndrome ([place])   . Stroke [facility])    "I've had 2 minor ones"; denies residual on 06/28/2016  . Symptomatic bradycardia 03/2016    Current Outpatient Medications on File Prior to Visit  Medication Sig Dispense Refill  . amLODipine (NORVASC) 10 MG tablet Take 10 mg by mouth daily.    Marland Kitchen atorvastatin (LIPITOR) 10 MG  tablet Take 1 tablet (10 mg total) by mouth daily. 30 tablet 6  . carbamazepine (TEGRETOL) 200 MG tablet Take 200 mg by mouth 4 (four) times daily.     . carvedilol (COREG) 12.5 MG tablet Take 1 tablet (12.5 mg total) by mouth 2 (two) times daily. 180 tablet 3  . citalopram (CELEXA) 40 MG tablet Take 40 mg by mouth daily.    . clorazepate (TRANXENE-T) 3.75 MG tablet Take 3.75 mg by mouth 2 (two) times daily.      Marland Kitchen dipyridamole-aspirin (AGGRENOX) 200-25 MG 12hr capsule Take 2 capsules by mouth at bedtime.     . fluticasone (FLONASE) 50 MCG/ACT nasal spray Place 2 sprays into both nostrils at bedtime as needed for allergies or rhinitis.    . folic acid (FOLVITE) 1 MG tablet Take 1 mg by mouth daily.      Marland Kitchen gabapentin (NEURONTIN) 800 MG tablet  Take 800 mg by mouth 2 (two) times daily.    Marland Kitchen lisinopril (PRINIVIL,ZESTRIL) 40 MG tablet Take 1 tablet by mouth daily 90 tablet 3  . nitroGLYCERIN (NITROSTAT) 0.4 MG SL tablet Place 1 tablet (0.4 mg total) under the tongue every 5 (five) minutes x 3 doses as needed for chest pain. 25 tablet 4  . spironolactone (ALDACTONE) 25 MG tablet Take 0.5 tablets (12.5 mg total) by mouth daily. 90 tablet 3   No current facility-administered medications on file prior to visit.     Allergies  Allergen Reactions  . Cyclosporine Other (See Comments)    Unknown  . Serzone [Nefazodone Hcl] Other (See Comments)    Unknown    Blood pressure 124/78, pulse 63, SpO2 99 %.   Assessment/Plan: Hypertension: BP at goal today. However home measurements are often above target. He is usually at goal on days that he is physically active. Have encouraged physical activity and continue to monitor. Follow up as scheduled and with HTN clinic if needed. Advised to call if pressures trend up.   Thank you, Lelan Pons. Patterson Hammersmith, [facility]  07/20/2017 4:16 PM

## 2017-07-19 NOTE — Patient Instructions (Addendum)
Blood work today - BMET   We will call you with results.   CONTINUE all medications as prescribed.   Try to increase your exercise as tolerated to help with blood pressure control.   Continue to monitor and call if pressures do not trend down with exercise.

## 2017-07-20 LAB — BASIC METABOLIC PANEL
BUN/Creatinine Ratio: 14 (ref 10–24)
BUN: 10 mg/dL (ref 8–27)
CALCIUM: 8.6 mg/dL (ref 8.6–10.2)
CO2: 23 mmol/L (ref 20–29)
Chloride: 96 mmol/L (ref 96–106)
Creatinine, Ser: 0.71 mg/dL — ABNORMAL LOW (ref 0.76–1.27)
GFR calc Af Amer: 110 mL/min/{1.73_m2} (ref >59)
GFR, EST NON AFRICAN AMERICAN: 95 mL/min/{1.73_m2} (ref >59)
Glucose: 95 mg/dL (ref 65–99)
Potassium: 4.8 mmol/L (ref 3.5–5.2)
Sodium: 131 mmol/L — ABNORMAL LOW (ref 134–144)

## 2017-07-28 ENCOUNTER — Telehealth: Payer: Self-pay | Admitting: *Deleted

## 2017-07-28 NOTE — Telephone Encounter (Signed)
Per Ivin Booty at [facility] his 95th percentile was on 79.8

## 2017-07-28 NOTE — Telephone Encounter (Signed)
-----   Message from Sueanne Margarita, MD sent at 07/24/2017  5:50 PM EDT ----- Inadequate CPAP titration with persistently elevated AHI - please set up for in lab BiPAP titration

## 2017-07-28 NOTE — Telephone Encounter (Signed)
-----   Message from Sueanne Margarita, MD sent at 07/27/2017  7:45 PM EDT ----- Please call [facility] to find out what patient's 95% pressure was on CPAP titration

## 2017-07-28 NOTE — Telephone Encounter (Signed)
Informed patient of titration results and verbalized understanding was indicated. Patient understands his Titration study showed an Inadequate CPAP titration with persistently elevated AHI. Patient understands Dr Radford Pax recommends he have a in lab BiPAP Titration. Pt is aware and agreeable to treatment.

## 2017-07-28 NOTE — Addendum Note (Signed)
Addended by: Freada Bergeron on: 07/28/2017 08:45 AM   Modules accepted: Orders

## 2017-08-07 ENCOUNTER — Other Ambulatory Visit: Payer: Medicare Other

## 2017-08-18 DIAGNOSIS — Z0000 Encounter for general adult medical examination without abnormal findings: Secondary | ICD-10-CM | POA: Diagnosis not present

## 2017-08-18 DIAGNOSIS — F419 Anxiety disorder, unspecified: Secondary | ICD-10-CM | POA: Diagnosis not present

## 2017-08-18 DIAGNOSIS — Z125 Encounter for screening for malignant neoplasm of prostate: Secondary | ICD-10-CM | POA: Diagnosis not present

## 2017-08-18 DIAGNOSIS — F329 Major depressive disorder, single episode, unspecified: Secondary | ICD-10-CM | POA: Diagnosis not present

## 2017-08-18 DIAGNOSIS — G40909 Epilepsy, unspecified, not intractable, without status epilepticus: Secondary | ICD-10-CM | POA: Diagnosis not present

## 2017-08-18 DIAGNOSIS — G629 Polyneuropathy, unspecified: Secondary | ICD-10-CM | POA: Diagnosis not present

## 2017-08-18 DIAGNOSIS — Z1159 Encounter for screening for other viral diseases: Secondary | ICD-10-CM | POA: Diagnosis not present

## 2017-08-18 DIAGNOSIS — E782 Mixed hyperlipidemia: Secondary | ICD-10-CM | POA: Diagnosis not present

## 2017-08-18 DIAGNOSIS — D696 Thrombocytopenia, unspecified: Secondary | ICD-10-CM | POA: Diagnosis not present

## 2017-08-18 DIAGNOSIS — I10 Essential (primary) hypertension: Secondary | ICD-10-CM | POA: Diagnosis not present

## 2017-08-18 DIAGNOSIS — Z136 Encounter for screening for cardiovascular disorders: Secondary | ICD-10-CM | POA: Diagnosis not present

## 2017-08-18 DIAGNOSIS — Z91038 Other insect allergy status: Secondary | ICD-10-CM | POA: Diagnosis not present

## 2017-08-21 ENCOUNTER — Other Ambulatory Visit: Payer: Self-pay | Admitting: Family Medicine

## 2017-08-21 DIAGNOSIS — Z136 Encounter for screening for cardiovascular disorders: Secondary | ICD-10-CM

## 2017-08-29 ENCOUNTER — Ambulatory Visit
Admission: RE | Admit: 2017-08-29 | Discharge: 2017-08-29 | Disposition: A | Discharge status: Home / Self Care | Payer: Medicare Other | Source: Ambulatory Visit | Attending: Family Medicine | Admitting: Family Medicine

## 2017-08-29 DIAGNOSIS — Z136 Encounter for screening for cardiovascular disorders: Secondary | ICD-10-CM | POA: Diagnosis not present

## 2017-08-29 DIAGNOSIS — Z87891 Personal history of nicotine dependence: Secondary | ICD-10-CM | POA: Diagnosis not present

## 2017-09-04 DIAGNOSIS — I6350 Cerebral infarction due to unspecified occlusion or stenosis of unspecified cerebral artery: Secondary | ICD-10-CM | POA: Diagnosis not present

## 2017-09-04 DIAGNOSIS — G40209 Localization-related (focal) (partial) symptomatic epilepsy and epileptic syndromes with complex partial seizures, not intractable, without status epilepticus: Secondary | ICD-10-CM | POA: Diagnosis not present

## 2017-09-04 DIAGNOSIS — G250 Essential tremor: Secondary | ICD-10-CM | POA: Diagnosis not present

## 2017-09-04 DIAGNOSIS — B004 Herpesviral encephalitis: Secondary | ICD-10-CM | POA: Diagnosis not present

## 2017-09-28 NOTE — Progress Notes (Signed)
Electrophysiology Office Note Date: 10/04/2017  ID:  Aaron Keller, DOB [birth year], MRN [id number]  PCP: Antony Contras, MD Primary Cardiologist: End Sleep: Radford Pax Electrophysiologist: Allred  CC: Pacemaker follow-up  Aaron Keller is a 72 y.o. male seen today for Dr Aaron Keller.  He presents today for routine electrophysiology followup.  Since last being seen in our clinic, the patient reports doing reasonably well.  He is not tolerating CPAP because he likes to sleep on his belly.  He was supposed to have a repeat sleep study done but has not heard about scheduling (had to cancel before 2/2 wifes surgery).   He denies chest pain, palpitations, dyspnea, PND, orthopnea, nausea, vomiting, dizziness, syncope, edema, weight gain, or early satiety.  Device History: STJ dual chamber PPM implanted 2018 for SSS   Past Medical History:  Diagnosis Date  . CAD in native artery- non obstructive on cath  04/04/2016  . Encephalitis 02/18/1985  . Hepatitis ~ 1959   "don't know what kind"  . HTN (hypertension) 04/04/2016  . Hx of tonic-clonic seizures 02/18/1985 X 1  . Hypertension   . Nasal bleeding    "because of one of the RX I was on" (06/29/2016)  . Obesity (BMI 30-39.9) 06/15/2017  . OSA (obstructive sleep apnea) 06/15/2017   Moderate with AHI 16.6/hr now on CPAP at 12cm H2O  . Presence of permanent cardiac pacemaker   . Seasonal allergies   . Seizures ([place]) 02/18/1985 X 1   "related to encephalitis"  . Sick sinus syndrome ([place])   . Stroke [facility])    "I've had 2 minor ones"; denies residual on 06/28/2016  . Symptomatic bradycardia 03/2016   Past Surgical History:  Procedure Laterality Date  . APPENDECTOMY    . CARDIAC CATHETERIZATION N/A 04/01/2016   Procedure: Left Heart Cath and Coronary Angiography;  Surgeon: Aaron M Martinique, MD;  Location: [place] [facility];  Service: Cardiovascular;  Laterality: N/A;  . CARDIAC CATHETERIZATION N/A 04/01/2016   Procedure: Temporary Pacemaker;   Surgeon: Aaron M Martinique, MD;  Location: [place] [facility];  Service: Cardiovascular;  Laterality: N/A;  . CATARACT EXTRACTION W/ INTRAOCULAR LENS  IMPLANT, BILATERAL Bilateral ~ 2007  . ELBOW SURGERY Right   . FRACTURE SURGERY    . INGUINAL HERNIA REPAIR Right 08/2010  . PACEMAKER IMPLANT N/A 06/28/2016   Procedure: Pacemaker Implant;  Surgeon: Aaron Grayer, MD;  Location: [place] [facility];  Service: Cardiovascular;  Laterality: N/A;  . PACEMAKER IMPLANT  06/28/2016   SJM Assurity MRI PPM implanted by Dr Aaron Keller for sick sinus syndrome  . WRIST FRACTURE SURGERY Left 1950s    Current Outpatient Medications  Medication Sig Dispense Refill  . amLODipine (NORVASC) 10 MG tablet Take 10 mg by mouth daily.    Marland Kitchen atorvastatin (LIPITOR) 10 MG tablet Take 1 tablet (10 mg total) by mouth daily. 30 tablet 6  . carbamazepine (TEGRETOL) 200 MG tablet Take 200 mg by mouth 4 (four) times daily.     . carvedilol (COREG) 12.5 MG tablet Take 1 tablet (12.5 mg total) by mouth 2 (two) times daily. 180 tablet 3  . citalopram (CELEXA) 40 MG tablet Take 40 mg by mouth daily.    . clorazepate (TRANXENE-T) 3.75 MG tablet Take 3.75 mg by mouth 2 (two) times daily.      Marland Kitchen dipyridamole-aspirin (AGGRENOX) 200-25 MG 12hr capsule Take 2 capsules by mouth at bedtime.     . fluticasone (FLONASE) 50 MCG/ACT nasal spray Place 2 sprays  into both nostrils at bedtime as needed for allergies or rhinitis.    . folic acid (FOLVITE) 1 MG tablet Take 1 mg by mouth daily.      Marland Kitchen gabapentin (NEURONTIN) 800 MG tablet Take 800 mg by mouth 2 (two) times daily.    Marland Kitchen lisinopril (PRINIVIL,ZESTRIL) 40 MG tablet Take 1 tablet by mouth daily 90 tablet 3  . nitroGLYCERIN (NITROSTAT) 0.4 MG SL tablet Place 1 tablet (0.4 mg total) under the tongue every 5 (five) minutes x 3 doses as needed for chest pain. 25 tablet 4  . spironolactone (ALDACTONE) 25 MG tablet Take 0.5 tablets (12.5 mg total) by mouth daily. 90 tablet 3   No current  facility-administered medications for this visit.     Allergies:   Cyclosporine and Serzone [nefazodone hcl]   Social History: Social History   Socioeconomic History  . Marital status: Married    Spouse name: Not on file  . Number of children: Not on file  . Years of education: Not on file  . Highest education level: Not on file  Occupational History  . Not on file  Social Needs  . Financial resource strain: Not on file  . Food insecurity:    Worry: Not on file    Inability: Not on file  . Transportation needs:    Medical: Not on file    Non-medical: Not on file  Tobacco Use  . Smoking status: Former Smoker    Years: 1.00  . Smokeless tobacco: Never Used  . Tobacco comment: 06/29/2016 "smoked for ~ 1 yr while in the service"  Substance and Sexual Activity  . Alcohol use: No  . Drug use: No  . Sexual activity: Never  Lifestyle  . Physical activity:    Days per week: Not on file    Minutes per session: Not on file  . Stress: Not on file  Relationships  . Social connections:    Talks on phone: Not on file    Gets together: Not on file    Attends religious service: Not on file    Active member of club or organization: Not on file    Attends meetings of clubs or organizations: Not on file    Relationship status: Not on file  . Intimate partner violence:    Fear of current or ex partner: Not on file    Emotionally abused: Not on file    Physically abused: Not on file    Forced sexual activity: Not on file  Other Topics Concern  . Not on file  Social History Narrative  . Not on file    Family History: Family History  Problem Relation Age of Onset  . Heart attack Mother        in her 53's  . Heart disease Mother        skipped beats  . Cancer Father        liver     Review of Systems: All other systems reviewed and are otherwise negative except as noted above.   Physical Exam: VS:  BP 132/74   Pulse 76   Ht 5\' 5"  (1.651 m)   Wt 187 lb (84.8 kg)    SpO2 97%   BMI 31.12 kg/m  , BMI Body mass index is 31.12 kg/m.  GEN- The patient is well appearing, alert and oriented x 3 today.   HEENT: normocephalic, atraumatic; sclera clear, conjunctiva pink; hearing intact; oropharynx clear; neck supple  Lungs- Clear to ausculation bilaterally,  normal work of breathing.  No wheezes, rales, rhonchi Heart- Regular rate and rhythm  GI- soft, non-tender, non-distended, bowel sounds present  Extremities- no clubbing, cyanosis, or edema  MS- no significant deformity or atrophy Skin- warm and dry, no rash or lesion; PPM pocket well healed Psych- euthymic mood, full affect Neuro- strength and sensation are intact  PPM Interrogation- reviewed in detail today,  See PACEART report  EKG:  EKG is not ordered today.  Recent Labs: 07/19/2017: BUN 10; Creatinine, Ser 0.71; Potassium 4.8; Sodium 131   Wt Readings from Last 3 Encounters:  10/04/17 187 lb (84.8 kg)  06/15/17 188 lb 9.6 oz (85.5 kg)  06/01/17 191 lb (86.6 kg)     Other studies Reviewed: Additional studies/ records that were reviewed today include: Dr Aaron Keller, Dr Radford Pax, Dr Darnelle Bos office notes   Assessment and Plan:  1.  Sick sinus syndrome Normal PPM function See Pace Art report No changes today  2.  CAD No recent ischemic symptoms Continue medical therapy  3.  HTN Stable No change required today  4.  OSA He is struggling with mask as he likes to sleep on his stomach He also was supposed to have repeat sleep study for optimization but needs to be rescheduled - will send message to Dr Theodosia Blender team for follow up   Current medicines are reviewed at length with the patient today.   The patient does not have concerns regarding his medicines.  The following changes were made today:  none  Labs/ tests ordered today include: none Orders Placed This Encounter  Procedures  . Basic metabolic panel     Disposition:   Follow up with Merlin, me in 1 year      Signed, Chanetta Marshall, NP 10/04/2017 12:20 PM  [address] [place] [address] [phone] (office) [phone] (fax)

## 2017-10-04 ENCOUNTER — Encounter: Payer: Self-pay | Admitting: Nurse Practitioner

## 2017-10-04 ENCOUNTER — Ambulatory Visit (INDEPENDENT_AMBULATORY_CARE_PROVIDER_SITE_OTHER): Payer: Medicare Other | Admitting: Nurse Practitioner

## 2017-10-04 VITALS — BP 132/74 | HR 76 | Ht 65.0 in | Wt 187.0 lb

## 2017-10-04 DIAGNOSIS — I10 Essential (primary) hypertension: Secondary | ICD-10-CM

## 2017-10-04 DIAGNOSIS — G4733 Obstructive sleep apnea (adult) (pediatric): Secondary | ICD-10-CM | POA: Diagnosis not present

## 2017-10-04 DIAGNOSIS — I495 Sick sinus syndrome: Secondary | ICD-10-CM

## 2017-10-04 DIAGNOSIS — I2510 Atherosclerotic heart disease of native coronary artery without angina pectoris: Secondary | ICD-10-CM | POA: Diagnosis not present

## 2017-10-04 LAB — CUP PACEART INCLINIC DEVICE CHECK
Date Time Interrogation Session: 20190717122410
Implantable Lead Implant Date: 20180410
Implantable Lead Location: 753859
Implantable Lead Location: 753860
Implantable Pulse Generator Implant Date: 20180410
MDC IDC LEAD IMPLANT DT: 20180410
MDC IDC PG SERIAL: 8016790
Pulse Gen Model: 2272

## 2017-10-04 LAB — BASIC METABOLIC PANEL
BUN/Creatinine Ratio: 13 (ref 10–24)
BUN: 9 mg/dL (ref 8–27)
CALCIUM: 9.1 mg/dL (ref 8.6–10.2)
CHLORIDE: 94 mmol/L — AB (ref 96–106)
CO2: 26 mmol/L (ref 20–29)
Creatinine, Ser: 0.67 mg/dL — ABNORMAL LOW (ref 0.76–1.27)
GFR calc Af Amer: 113 mL/min/{1.73_m2} (ref >59)
GFR, EST NON AFRICAN AMERICAN: 97 mL/min/{1.73_m2} (ref >59)
Glucose: 90 mg/dL (ref 65–99)
POTASSIUM: 5 mmol/L (ref 3.5–5.2)
Sodium: 133 mmol/L — ABNORMAL LOW (ref 134–144)

## 2017-10-04 MED ORDER — SPIRONOLACTONE 25 MG PO TABS: 12.5000 mg | ORAL_TABLET | Freq: Every day | ORAL | 3 refills | Status: DC

## 2017-10-04 NOTE — Patient Instructions (Addendum)
Medication Instructions:   Your physician recommends that you continue on your current medications as directed. Please refer to the Current Medication list given to you today.  If you need a refill on your cardiac medications before your next appointment, please call your pharmacy.  Labwork: BMET  TODAY     Testing/Procedures: NONE ORDERED  TODAY    Follow-Up:  Your physician wants you to follow-up in: [place] will receive a reminder letter in the mail two months in advance. If you don't receive a letter, please call our office to schedule the follow-up appointment.    Remote monitoring is used to monitor your Pacemaker of ICD from home. This monitoring reduces the number of office visits required to check your device to one time per year. It allows [place] to keep an eye on the functioning of your device to ensure it is working properly. You are scheduled for a device check from home on .  7-*22-19 You may send your transmission at any time that day. If you have a wireless device, the transmission will be sent automatically. After your physician reviews your transmission, you will receive a postcard with your next transmission date.     Any Other Special Instructions Will Be Listed Below (If Applicable).

## 2017-10-06 ENCOUNTER — Telehealth: Payer: Self-pay | Admitting: *Deleted

## 2017-10-06 ENCOUNTER — Encounter: Payer: Medicare Other | Admitting: Nurse Practitioner

## 2017-10-06 NOTE — Telephone Encounter (Signed)
-----   Message from Patsey Berthold, NP sent at 10/04/2017  3:36 PM EDT ----- Please notify patient of stable labs. Thanks!

## 2017-10-06 NOTE — Telephone Encounter (Signed)
LMOVM  OF STABLE RESULTS AND CONTACT CLINIC BACK  IF ANY QUESTIONS OR CONCERNS

## 2017-10-09 ENCOUNTER — Ambulatory Visit (INDEPENDENT_AMBULATORY_CARE_PROVIDER_SITE_OTHER): Payer: Medicare Other | Admitting: *Deleted

## 2017-10-09 DIAGNOSIS — I495 Sick sinus syndrome: Secondary | ICD-10-CM | POA: Diagnosis not present

## 2017-10-09 NOTE — Progress Notes (Signed)
Remote pacemaker transmission.   

## 2017-10-10 ENCOUNTER — Encounter: Payer: Self-pay | Admitting: Cardiology

## 2017-11-05 LAB — CUP PACEART REMOTE DEVICE CHECK
Battery Remaining Longevity: 118 mo
Battery Remaining Percentage: 95.5 %
Brady Statistic AP VS Percent: 87 %
Brady Statistic AS VS Percent: 13 %
Brady Statistic RV Percent Paced: 0 %
Date Time Interrogation Session: 20190722081418
Implantable Lead Implant Date: 20180410
Implantable Lead Location: 753859
Implantable Lead Location: 753860
Lead Channel Impedance Value: 490 Ohm
Lead Channel Pacing Threshold Amplitude: 0.75 V
Lead Channel Pacing Threshold Amplitude: 0.75 V
Lead Channel Pacing Threshold Pulse Width: 0.5 ms
Lead Channel Sensing Intrinsic Amplitude: 2.4 mV
Lead Channel Sensing Intrinsic Amplitude: 8.7 mV
Lead Channel Setting Pacing Amplitude: 2 V
Lead Channel Setting Pacing Pulse Width: 0.5 ms
Lead Channel Setting Sensing Sensitivity: 2 mV
MDC IDC LEAD IMPLANT DT: 20180410
MDC IDC MSMT BATTERY VOLTAGE: 3.02 V
MDC IDC MSMT LEADCHNL RA PACING THRESHOLD PULSEWIDTH: 0.5 ms
MDC IDC MSMT LEADCHNL RV IMPEDANCE VALUE: 590 Ohm
MDC IDC PG IMPLANT DT: 20180410
MDC IDC SET LEADCHNL RV PACING AMPLITUDE: 2.5 V
MDC IDC STAT BRADY AP VP PERCENT: 0 %
MDC IDC STAT BRADY AS VP PERCENT: 0 %
MDC IDC STAT BRADY RA PERCENT PACED: 87 %
Pulse Gen Model: 2272
Pulse Gen Serial Number: 8016790

## 2017-12-04 DIAGNOSIS — J069 Acute upper respiratory infection, unspecified: Secondary | ICD-10-CM | POA: Diagnosis not present

## 2017-12-04 DIAGNOSIS — J0110 Acute frontal sinusitis, unspecified: Secondary | ICD-10-CM | POA: Diagnosis not present

## 2017-12-13 NOTE — Progress Notes (Signed)
Follow-up Outpatient Visit Date: 12/14/2017  Primary Care Provider: Antony Contras, MD [place] [id number]  Chief Complaint: Fatigue and chest pain  HPI:  Mr. Buening is a 72 y.o. year-old male with history of CAD, SSS s/p dual-chamber PPM, HTN, CVA, and encephalitis, who presents for follow-up of hypertension and CAD.  I last saw Mr. Card in March, at which time he was feeling well.  However, his blood pressures remained suboptimally controlled with systolic readings in the 696-295 mmHg range.  We switched him to lisinopril 40 mg daily, due to inavailability of losartan secondary to medication recall.  Blood pressure was at goal at the time of follow-up visit with the hypertension clinic in early May.  Today, Mr. Ancheta reports that he has noticed a single episode of substernal chest pain while working in the yard.  It was a tightness in the center of his chest that lasted about 15 seconds and then resolved spontaneously.  He felt fatigued and generally unwell for the next day and a half.  He notes stable exertional dyspnea when weed eating or doing other strenuous activities.  He is concerned about the possibility that his chest pain may have been related to his pacemaker.  He has not had any further chest pain nor palpitations, lightheadedness, or edema.  He notes that his blood pressures at home have generally been well controlled, with only a few readings into the 284X systolic.  --------------------------------------------------------------------------------------------------  Cardiovascular History & Procedures: Cardiovascular Problems:  Sinus arrest with junctional bradycardia in the setting of beta-blocker use  Stroke (2002)  Coronary artery disease  Risk Factors:  Known CAD, stroke, hypertension, male gender, and age > 75  Cath/PCI:  LHC (04/01/16): Mild ectasia of the proximal LAD. Distal LAD diffusely diseased with focal area of 90%  stenosis. Dominant LCx with diffuse ectasia and diffuse 50% stenosis of lPDA. 60% mid RCA lesion. Normal LV contraction. Mildly elevated LVEDP.  CV Surgery:  None  EP Procedures and Devices:  Dual chamber pacemaker (06/28/16, Dr. Rayann Heman): [employer]. Jude Assurity  30 day event monitor (05/03/16): Patient was monitored for 30 days with predominately normal sinus rhythm (average rate 65 bpm (range 38-104 bpm). Sinus pauses with junctional escapes noted both at night and during the daytime (longest RR interval 3.8 seconds). Rare PACs noted.  Temporary transvenous pacemaker (04/01/16)  Non-Invasive Evaluation(s):  Exercise tolerance test (04/28/16): Patient exercised 5 minutes, 0 seconds (7 METS) achieving a maximal heart rate of 115 bpm (76% MPHR). No significant ST segment changes were noted. Test inconclusive for ischemia due to target heart rate not having been achieved.  Transthoracic echocardiogram (04/02/16): Normal LV size with moderate LVH. LVEF 65-70% with normal wall motion. Grade 1 diastolic dysfunction. Mild aortic regurgitation. Normal RV size and function.  Recent CV Pertinent Labs: Lab Results  Component Value Date   CHOL 102 04/02/2016   HDL 40 (L) 04/02/2016   LDLCALC 43 04/02/2016   TRIG 97 04/02/2016   CHOLHDL 2.6 04/02/2016   INR 0.99 04/01/2016   K 5.0 10/04/2017   MG 2.1 04/01/2016   BUN 9 10/04/2017   CREATININE 0.67 (L) 10/04/2017    Past medical and surgical history were reviewed and updated in EPIC.  Current Meds  Medication Sig  . amLODipine (NORVASC) 10 MG tablet Take 10 mg by mouth daily.  Marland Kitchen atorvastatin (LIPITOR) 10 MG tablet Take 1 tablet (10 mg total) by mouth daily.  . carbamazepine (TEGRETOL) 200 MG tablet  Take 200 mg by mouth 4 (four) times daily.   . carvedilol (COREG) 12.5 MG tablet Take 1 tablet (12.5 mg total) by mouth 2 (two) times daily.  . citalopram (CELEXA) 40 MG tablet Take 40 mg by mouth daily.  . clorazepate (TRANXENE-T) 3.75 MG  tablet Take 3.75 mg by mouth 2 (two) times daily.    Marland Kitchen dipyridamole-aspirin (AGGRENOX) 200-25 MG 12hr capsule Take 2 capsules by mouth at bedtime.   . fluticasone (FLONASE) 50 MCG/ACT nasal spray Place 2 sprays into both nostrils at bedtime as needed for allergies or rhinitis.  . folic acid (FOLVITE) 1 MG tablet Take 1 mg by mouth daily.    Marland Kitchen gabapentin (NEURONTIN) 800 MG tablet Take 800 mg by mouth 2 (two) times daily.  Marland Kitchen lisinopril (PRINIVIL,ZESTRIL) 40 MG tablet Take 1 tablet by mouth daily  . nitroGLYCERIN (NITROSTAT) 0.4 MG SL tablet Place 1 tablet (0.4 mg total) under the tongue every 5 (five) minutes x 3 doses as needed for chest pain.  Marland Kitchen spironolactone (ALDACTONE) 25 MG tablet Take 0.5 tablets (12.5 mg total) by mouth daily.    Allergies: Cyclosporine and Serzone [nefazodone hcl]  Social History   Tobacco Use  . Smoking status: Former Smoker    Years: 1.00  . Smokeless tobacco: Never Used  . 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 Alcohol use: No  . Drug use: No    Family History  Problem Relation Age of Onset  . Heart attack Mother        in her 66's  . Heart disease Mother        skipped beats  . Cancer Father        liver    Review of Systems: A 12-system review of systems was performed and was negative except as noted in the HPI.  --------------------------------------------------------------------------------------------------  Physical Exam: BP 136/72   Pulse 63   Ht 5\' 5"  (1.651 m)   Wt 188 lb (85.3 kg)   SpO2 97%   BMI 31.28 kg/m   General: NAD. HEENT: No conjunctival pallor or scleral icterus. Moist mucous membranes.  OP clear. Neck: Supple without lymphadenopathy, thyromegaly, JVD, or HJR. Lungs: Normal work of breathing. Clear to auscultation bilaterally without wheezes or crackles. Heart: Regular rate and rhythm without murmurs, rubs, or gallops. Non-displaced PMI. Abd: Bowel sounds present.  Soft, NT/ND without hepatosplenomegaly Ext: No lower extremity edema. Radial, PT, and DP pulses are 2+ bilaterally. Skin: Warm and dry without rash.  EKG: Atrially paced, ventricularly sensed rhythm with left anterior fascicular block.  Otherwise, no significant abnormalities.  Device interrogation performed today shows normal pacemaker function without any significant events.  Lab Results  Component Value Date   WBC 5.4 06/28/2016   HGB 14.1 06/28/2016   HCT 39.1 06/28/2016   MCV 86.3 06/28/2016   PLT 124 (L) 06/28/2016    Lab Results  Component Value Date   NA 133 (L) 10/04/2017   K 5.0 10/04/2017   CL 94 (L) 10/04/2017   CO2 26 10/04/2017   BUN 9 10/04/2017   CREATININE 0.67 (L) 10/04/2017   GLUCOSE 90 10/04/2017   ALT 18 04/01/2016    Lab Results  Component Value Date   CHOL 102 04/02/2016   HDL 40 (L) 04/02/2016   LDLCALC 43 04/02/2016   TRIG 97 04/02/2016   CHOLHDL 2.6 04/02/2016    --------------------------------------------------------------------------------------------------  ASSESSMENT AND PLAN: Coronary artery disease with angina Mr.  Shipper reports one episode of brief, atypical chest discomfort while working outside.  Catheterization last year was notable for severe distal LAD disease as well as mild to moderate stenoses and other vessels.  No intervention was performed at that time.  We have discussed repeat catheterization, stress testing, and escalation of medical therapy and have agreed to perform a pharmacologic myocardial perfusion stress test, given that Mr. Patchen has only had one episode.  I have renewed his nitroglycerin.  If stress test is significantly abnormal, we would need to repeat catheterization.  Hypertension Blood pressure reasonably well controlled.  Continue current medications.  Sick sinus syndrome Normal pacemaker function.  No report of symptomatic bradycardia.  Continue routine device/EP follow-up.  Hyperlipidemia Continue  atorvastatin 10 mg daily.  LDL at goal on last check with PCP (59 on 08/18/2017).  Follow-up: Return to clinic in 6 weeks with APP.  Long-term, I will have Mr. Faucett follow-up with Dr. Acie Fredrickson, given my transition to the [facility] office.  Nelva Bush, MD 12/14/2017 2:01 PM

## 2017-12-14 ENCOUNTER — Ambulatory Visit (INDEPENDENT_AMBULATORY_CARE_PROVIDER_SITE_OTHER): Payer: Medicare Other | Admitting: Internal Medicine

## 2017-12-14 ENCOUNTER — Ambulatory Visit (INDEPENDENT_AMBULATORY_CARE_PROVIDER_SITE_OTHER): Payer: Medicare Other | Admitting: *Deleted

## 2017-12-14 ENCOUNTER — Encounter: Payer: Self-pay | Admitting: Internal Medicine

## 2017-12-14 VITALS — BP 136/72 | HR 63 | Ht 65.0 in | Wt 188.0 lb

## 2017-12-14 DIAGNOSIS — I495 Sick sinus syndrome: Secondary | ICD-10-CM | POA: Diagnosis not present

## 2017-12-14 DIAGNOSIS — I25119 Atherosclerotic heart disease of native coronary artery with unspecified angina pectoris: Secondary | ICD-10-CM | POA: Diagnosis not present

## 2017-12-14 DIAGNOSIS — I10 Essential (primary) hypertension: Secondary | ICD-10-CM | POA: Diagnosis not present

## 2017-12-14 DIAGNOSIS — Z950 Presence of cardiac pacemaker: Secondary | ICD-10-CM

## 2017-12-14 DIAGNOSIS — E785 Hyperlipidemia, unspecified: Secondary | ICD-10-CM

## 2017-12-14 LAB — CUP PACEART INCLINIC DEVICE CHECK
Battery Remaining Longevity: 124 mo
Battery Voltage: 3.01 V
Brady Statistic RA Percent Paced: 89 %
Brady Statistic RV Percent Paced: 0.01 %
Date Time Interrogation Session: 20190926145651
Implantable Lead Implant Date: 20180410
Implantable Lead Location: 753859
Implantable Lead Location: 753860
Implantable Pulse Generator Implant Date: 20180410
Lead Channel Impedance Value: 487.5 Ohm
Lead Channel Impedance Value: 562.5 Ohm
Lead Channel Setting Pacing Amplitude: 2 V
Lead Channel Setting Pacing Pulse Width: 0.5 ms
Lead Channel Setting Sensing Sensitivity: 2 mV
MDC IDC LEAD IMPLANT DT: 20180410
MDC IDC MSMT LEADCHNL RA SENSING INTR AMPL: 1.8 mV
MDC IDC MSMT LEADCHNL RV SENSING INTR AMPL: 8.3 mV
MDC IDC PG SERIAL: 8016790
MDC IDC SET LEADCHNL RV PACING AMPLITUDE: 2.5 V
Pulse Gen Model: 2272

## 2017-12-14 MED ORDER — NITROGLYCERIN 0.4 MG SL SUBL: 0.4000 mg | SUBLINGUAL_TABLET | SUBLINGUAL | 4 refills | Status: AC | PRN

## 2017-12-14 NOTE — Patient Instructions (Addendum)
Medication Instructions:  Your physician recommends that you continue on your current medications as directed. Please refer to the Current Medication list given to you today.  -- If you need a refill on your cardiac medications before your next appointment, please call your pharmacy. --  Labwork: None ordered  Testing/Procedures: Your physician has requested that you have a lexiscan myoview. For further information please visit [email or web address]. Please follow instruction sheet, as given.   Follow-Up: Your physician wants you to follow-up in: 6 weeks with APP.  Future f/u with Dr Vilinda Boehringer will receive a reminder letter in the mail two months in advance. If you don't receive a letter, please call our office to schedule the follow-up appointment.  Thank you for choosing [facility]!!    Any Other Special Instructions Will Be Listed Below (If Applicable).

## 2017-12-14 NOTE — Progress Notes (Signed)
Patient added-on at Dr. Darnelle Bos request to check for any arrhythmia episodes. PPM interrogated, no testing performed. No mode switches or high ventricular rates noted. Remaining longevity 9.1-10.4 years. Merlin on 01/08/18 as scheduled and ROV with AS in 09/2018.

## 2017-12-15 ENCOUNTER — Encounter: Payer: Self-pay | Admitting: Internal Medicine

## 2017-12-21 ENCOUNTER — Telehealth (HOSPITAL_COMMUNITY): Payer: Self-pay

## 2017-12-21 NOTE — Telephone Encounter (Signed)
Pt contacted, instructions given. No seizure activity since 1987. Pt to be off Aggrenox for 72 hrs. Pt understood and stated that he would be here. S.Isyss Espinal EMTP

## 2017-12-28 ENCOUNTER — Ambulatory Visit (HOSPITAL_COMMUNITY): Payer: Medicare Other | Attending: Cardiovascular Disease

## 2017-12-28 DIAGNOSIS — Z950 Presence of cardiac pacemaker: Secondary | ICD-10-CM | POA: Insufficient documentation

## 2017-12-28 DIAGNOSIS — I25119 Atherosclerotic heart disease of native coronary artery with unspecified angina pectoris: Secondary | ICD-10-CM | POA: Diagnosis not present

## 2017-12-28 LAB — MYOCARDIAL PERFUSION IMAGING
CHL CUP RESTING HR STRESS: 60 {beats}/min
LV sys vol: 25 mL
LVDIAVOL: 70 mL (ref 62–150)
NUC STRESS TID: 1.13
Peak HR: 73 {beats}/min
SDS: 1
SRS: 0
SSS: 1

## 2017-12-28 MED ORDER — TECHNETIUM TC 99M TETROFOSMIN IV KIT
32.1000 | PACK | Freq: Once | INTRAVENOUS | Status: AC | PRN
  Administered 2017-12-28: 32.1 via INTRAVENOUS
  Filled 2017-12-28: qty 33

## 2017-12-28 MED ORDER — ADENOSINE (DIAGNOSTIC) 3 MG/ML IV SOLN
0.5600 mg/kg | Freq: Once | INTRAVENOUS | Status: AC
  Administered 2017-12-28: 47.7 mg via INTRAVENOUS

## 2017-12-28 MED ORDER — TECHNETIUM TC 99M TETROFOSMIN IV KIT
10.4000 | PACK | Freq: Once | INTRAVENOUS | Status: AC | PRN
  Administered 2017-12-28: 10.4 via INTRAVENOUS
  Filled 2017-12-28: qty 11

## 2018-01-05 DIAGNOSIS — Z23 Encounter for immunization: Secondary | ICD-10-CM | POA: Diagnosis not present

## 2018-01-08 ENCOUNTER — Ambulatory Visit (INDEPENDENT_AMBULATORY_CARE_PROVIDER_SITE_OTHER): Payer: Medicare Other | Admitting: *Deleted

## 2018-01-08 DIAGNOSIS — I495 Sick sinus syndrome: Secondary | ICD-10-CM

## 2018-01-08 NOTE — Progress Notes (Signed)
Remote pacemaker transmission.   

## 2018-01-16 ENCOUNTER — Encounter: Payer: Self-pay | Admitting: Physician Assistant

## 2018-01-16 ENCOUNTER — Ambulatory Visit (INDEPENDENT_AMBULATORY_CARE_PROVIDER_SITE_OTHER): Payer: Medicare Other | Admitting: Physician Assistant

## 2018-01-16 VITALS — BP 122/64 | HR 67 | Ht 65.0 in | Wt 188.8 lb

## 2018-01-16 DIAGNOSIS — I25119 Atherosclerotic heart disease of native coronary artery with unspecified angina pectoris: Secondary | ICD-10-CM

## 2018-01-16 DIAGNOSIS — Z950 Presence of cardiac pacemaker: Secondary | ICD-10-CM | POA: Diagnosis not present

## 2018-01-16 DIAGNOSIS — R0602 Shortness of breath: Secondary | ICD-10-CM | POA: Diagnosis not present

## 2018-01-16 DIAGNOSIS — E785 Hyperlipidemia, unspecified: Secondary | ICD-10-CM | POA: Diagnosis not present

## 2018-01-16 DIAGNOSIS — I10 Essential (primary) hypertension: Secondary | ICD-10-CM

## 2018-01-16 MED ORDER — ISOSORBIDE MONONITRATE ER 30 MG PO TB24: 15.0000 mg | ORAL_TABLET | Freq: Every day | ORAL | 11 refills | Status: DC

## 2018-01-16 NOTE — Patient Instructions (Addendum)
Medication Instructions:  1. START IMDUR 30 MG TABLET WITH DIRECTIONS: TAKE 1/2 TABLET DAILY = 15 MG DAILY; RX HAS BEEN SENT IN If you need a refill on your cardiac medications before your next appointment, please call your pharmacy.   Lab work: TODAY BMET, PRO BNP, CBC If you have labs (blood work) drawn today and your tests are completely normal, you will receive your results only by: Marland Kitchen MyChart Message (if you have MyChart) OR . A paper copy in the mail If you have any lab test that is abnormal or we need to change your treatment, we will call you to review the results.  Testing/Procedures: NONE ORDERED TODAY  Follow-Up: At [facility], you and your health needs are our priority.  As part of our continuing mission to provide you with exceptional heart care, we have created designated Provider Care Teams.  These Care Teams include your primary Cardiologist (physician) and Advanced Practice Providers (APPs -  Physician Assistants and Nurse Practitioners) who all work together to provide you with the care you need, when you need it. You will need a follow up appointment in:  1 months.  You may see DR. NAHSER ON 02/19/18 @ 10:40 AM   Any Other Special Instructions Will Be Listed Below (If Applicable). [facility]

## 2018-01-16 NOTE — Progress Notes (Signed)
Cardiology Office Note:    Date:  01/16/2018   ID:  Aaron Keller, DOB Apr 05, 1945, MRN [id number]  PCP:  Antony Contras, MD  Cardiologist:  Nelva Bush, MD   Electrophysiologist:  Thompson Grayer, MD   Referring MD: Antony Contras, MD   Chief Complaint  Patient presents with  . Follow-up    chest pain, recent stress test     History of Present Illness:    Aaron Keller is a 72 y.o. male with coronary artery disease, sick sinus syndrome status post pacemaker, hypertension, hyperlipidemia, prior stroke, encephalitis.  Cardiac catheterization in 2018 demonstrated severe distal LAD disease and moderate nonobstructive disease elsewhere.  He has been managed medically.  He was last seen by Dr. Saunders Revel in September 2019 with complaints of chest pain.  Nuclear stress test was obtained and demonstrated normal perfusion with normal LV function.  Therefore, medical therapy was continued.     Aaron Keller returns for follow-up.  He is here with his wife.  We discussed the results of his stress test.  He is mainly troubled by shortness of breath with any type of exertion.  This has been ongoing for months.  It does not seem to be getting any worse.  He has not had paroxysmal nocturnal dyspnea or orthopnea.  He does use his CPAP at night.  However, he is only able to keep it on about 4 hours a night.  He has not had significant leg swelling.  He has not had any further chest discomfort.  He has not had any syncope.  He denies melena, hematochezia, hematuria.  He denies any coughing or wheezing.  He has a remote history of tobacco use.  Prior CV studies:   The following studies were reviewed today:  Nuclear stress test 12/28/17 EF 64, normal perfusion, low risk  Renal artery ultrasound 12/30/2016 FINAL INTERPRETATION  Renal Normal size right renal artery. Normal right RI. Normal cortical thickness of right renal artery. No evidence of right renal artery stenosis.  Normal size of left renal  artery. Normal left RI. Normal cortical thickness of the left renal artery. No evidence of left renal artery stenosis.  Mesenteric Normal Celiac artery and Superior Mesenteric artery findings.   Event monitor 04/2016  Patient was monitored for 30 days.  The predominant rhythm was normal sinus rhythm with an average rate of 65 bpm (range 38 - 104 bpm).  There daytime and nocternal episodes of sinus pauses with junctional escape beats. Longest R-R interval was 3.8 seconds.  Rare PACs were noted. There were no sustained tachyarrhythmias.  Patient triggered events correspond to sinus rhythm, sinsus rhythm with PACs, and sinus pauses. Sinus rhythm with sinus pauses and junctional escape. Longest R-R interval 3.8 seconds.  Exercise tolerance test 04/28/2016  Blood pressure demonstrated a hypertensive response to exercise.  There was no ST segment deviation noted during stress.  No T wave inversion was noted during stress. Inconclusive study due to inadequate achieved heart rate (76% max). No ischemia for the workload achieved.  Echocardiogram 04/02/2016 Moderate concentric LVH, EF 65-70, normal wall motion, grade 1 diastolic dysfunction, mild AI, trivial TR  Cardiac catheterization 04/01/2016 LAD mid 35, distal 70/90 LPDA ostial 50 RCA mid 73 EF 55-65     Past Medical History:  Diagnosis Date  . CAD (coronary artery disease) 04/04/2016   LHC 1/18: mLAD 35, dLAD 70/90, oLPDA 50, mRCA 60, EF 55-65 // Nuc Stress 10/19:  EF 64, normal perfusion, low risk  . Echocardiogram  Echo 1/18: Moderate concentric LVH, EF 65-70, normal wall motion, grade 1 diastolic dysfunction, mild AI, trivial TR  . Encephalitis 02/18/1985  . Hepatitis ~ 1959   "don't know what kind"  . HTN (hypertension) 04/04/2016  . Hx of tonic-clonic seizures 02/18/1985 X 1   related to encephalitis  . Hypertension   . Nasal bleeding    "because of one of the RX I was on" (06/29/2016)  . Obesity (BMI 30-39.9)  06/15/2017  . OSA (obstructive sleep apnea) 06/15/2017   Moderate with AHI 16.6/hr now on CPAP at 12cm H2O  . Presence of permanent cardiac pacemaker   . Seasonal allergies   . Sick sinus syndrome ([place])    s/p dual chamber pacemaker in 2018  . Stroke [facility])    "I've had 2 minor ones"; denies residual on 06/28/2016   Surgical Hx: The patient  has a past surgical history that includes Appendectomy; Fracture surgery; Wrist fracture surgery (Left, 1950s); Elbow surgery (Right); Inguinal hernia repair (Right, 08/2010); Cataract extraction w/ intraocular lens  implant, bilateral (Bilateral, ~ 2007); Cardiac catheterization (N/A, 04/01/2016); Cardiac catheterization (N/A, 04/01/2016); PACEMAKER IMPLANT (N/A, 06/28/2016); and PACEMAKER IMPLANT (06/28/2016).   Current Medications: Current Meds  Medication Sig  . amLODipine (NORVASC) 10 MG tablet Take 10 mg by mouth daily.  Marland Kitchen atorvastatin (LIPITOR) 10 MG tablet Take 1 tablet (10 mg total) by mouth daily.  . carbamazepine (TEGRETOL) 200 MG tablet Take 200 mg by mouth 4 (four) times daily.   . carvedilol (COREG) 12.5 MG tablet Take 1 tablet (12.5 mg total) by mouth 2 (two) times daily.  . citalopram (CELEXA) 40 MG tablet Take 40 mg by mouth daily.  . clorazepate (TRANXENE-T) 3.75 MG tablet Take 3.75 mg by mouth 2 (two) times daily.    Marland Kitchen dipyridamole-aspirin (AGGRENOX) 200-25 MG 12hr capsule Take 2 capsules by mouth at bedtime.   . fluticasone (FLONASE) 50 MCG/ACT nasal spray Place 2 sprays into both nostrils at bedtime as needed for allergies or rhinitis.  . folic acid (FOLVITE) 1 MG tablet Take 1 mg by mouth daily.    Marland Kitchen gabapentin (NEURONTIN) 800 MG tablet Take 800 mg by mouth 2 (two) times daily.  Marland Kitchen lisinopril (PRINIVIL,ZESTRIL) 40 MG tablet Take 1 tablet by mouth daily  . nitroGLYCERIN (NITROSTAT) 0.4 MG SL tablet Place 1 tablet (0.4 mg total) under the tongue every 5 (five) minutes x 3 doses as needed for chest pain.  Marland Kitchen spironolactone (ALDACTONE) 25 MG  tablet Take 0.5 tablets (12.5 mg total) by mouth daily.     Allergies:   Cyclosporine and Serzone [nefazodone hcl]   Social History   Tobacco Use  . Smoking status: Former Smoker    Years: 1.00  . Smokeless tobacco: Never Used  . 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 Alcohol use: No  . Drug use: No     Family Hx: The patient's family history includes Cancer in his father; Heart attack in his mother; Heart disease in his mother.  ROS:   Please see the history of present illness.    ROS All other systems reviewed and are negative.   EKGs/Labs/Other Test Reviewed:    EKG:  EKG is not ordered today.    Recent Labs: 10/04/2017: BUN 9; Creatinine, Ser 0.67; Potassium 5.0; Sodium 133   Recent Lipid Panel Lab Results  Component Value Date/Time   CHOL 102 04/02/2016 05:00 AM   TRIG 97 04/02/2016 05:00  AM   HDL 40 (L) 04/02/2016 05:00 AM   CHOLHDL 2.6 04/02/2016 05:00 AM   LDLCALC 43 04/02/2016 05:00 AM   From KPN Tool: Cholesterol, total 128.000 08/18/2017 HDL 47.000 08/18/2017 LDL 58.000 08/18/2017 Triglycerides 119.000 08/18/2017 Hemoglobin 14.300 08/18/2017 Creatinine, Serum 0.670 10/04/2017 Potassium 5.000 10/04/2017 Magnesium N/D ALT (SGPT) 14.000 08/18/2017 TSH 3.510 08/18/2017 INR 0.990 04/01/2016 Platelets 124.000 06/28/2016  Physical Exam:    VS:  BP 122/64   Pulse 67   Ht 5\' 5"  (1.651 m)   Wt 188 lb 12.8 oz (85.6 kg)   SpO2 96%   BMI 31.42 kg/m     Wt Readings from Last 3 Encounters:  01/16/18 188 lb 12.8 oz (85.6 kg)  12/28/17 188 lb (85.3 kg)  12/14/17 188 lb (85.3 kg)     Physical Exam  Constitutional: He is oriented to person, place, and time. He appears well-developed and well-nourished. No distress.  HENT:  Head: Normocephalic and atraumatic.  Eyes: No scleral icterus.  Neck: No JVD present. No thyromegaly present.  Cardiovascular: Normal rate and regular rhythm.  No murmur  heard. Pulmonary/Chest: Effort normal. He has no rales.  Abdominal: Soft.  Musculoskeletal: He exhibits no edema.  Lymphadenopathy:    He has no cervical adenopathy.  Neurological: He is alert and oriented to person, place, and time.  Skin: Skin is warm and dry.  Psychiatric: He has a normal mood and affect.    ASSESSMENT & PLAN:    Shortness of breath Etiology not entirely clear.  Recent nuclear study was low risk.  He does have severe distal LAD disease.  I suspect that this could be an anginal equivalent.  He does have a history of mild diastolic dysfunction on prior echocardiogram.  Volume overload could be a possibility.  Although, he does not appear to be volume overloaded on exam.  He does not really have a significant history of smoking.  Question if his heart rate response to activity is blunted with his device.  Improper use of his CPAP may also be contributing.  He does note daytime sleepiness and can only wear his CPAP for about 4 hours a night.  He sees the [place] later this week to discuss further management of his sleep apnea.  -Obtain labs: BMET, BNP, CBC  -Start isosorbide 15 mg daily  -[facility]  -I will review with EP to see if there is any adjustment that can be made with his pacer.  -Follow up with me or Dr. Acie Fredrickson in ~ 4 weeks.   Coronary artery disease involving native coronary artery of native heart with angina pectoris [facility] [place] [facility]) Cardiac catheterization in January 2018 demonstrated severe distal disease in the LAD.  There was moderate nonobstructive disease elsewhere.  Recent nuclear stress test demonstrated normal perfusion and was low risk.  As noted, his shortness of breath with exertion may be an anginal equivalent related to his distal LAD disease.  I will start him on isosorbide 15 mg daily.  He does not take PDE-5 inhibitors.  Continue aspirin, statin, beta-blocker, amlodipine.  Essential hypertension Blood pressure at  target.  Hyperlipidemia LDL goal <70 Continue statin therapy.  Cardiac pacemaker in situ   Continue follow-up with EP.  As noted, I will review with him to see if there are any adjustments that could be made with his pacemaker.  Dispo:  Return in about 1 month (around 02/16/2018) for Routine Follow Up, w/ Dr. Acie Fredrickson, or Richardson Dopp, PA-C.  Medication Adjustments/Labs and Tests Ordered: Current medicines are reviewed at length with the patient today.  Concerns regarding medicines are outlined above.  Tests Ordered: Orders Placed This Encounter  Procedures  . Basic Metabolic Panel (BMET)  . CBC  . Pro b natriuretic peptide   Medication Changes: Meds ordered this encounter  Medications  . isosorbide mononitrate (IMDUR) 30 MG 24 hr tablet    Sig: Take 0.5 tablets (15 mg total) by mouth daily.    Dispense:  30 tablet    Refill:  [address] Richardson Dopp, [place]  01/16/2018 12:53 PM    [address], [place], [place]  [address] Phone: [phone]; Fax: [phone]

## 2018-01-17 ENCOUNTER — Telehealth: Payer: Self-pay | Admitting: *Deleted

## 2018-01-17 LAB — BASIC METABOLIC PANEL
BUN/Creatinine Ratio: 14 (ref 10–24)
BUN: 9 mg/dL (ref 8–27)
CO2: 24 mmol/L (ref 20–29)
CREATININE: 0.66 mg/dL — AB (ref 0.76–1.27)
Calcium: 8.5 mg/dL — ABNORMAL LOW (ref 8.6–10.2)
Chloride: 93 mmol/L — ABNORMAL LOW (ref 96–106)
GFR calc Af Amer: 112 mL/min/{1.73_m2} (ref >59)
GFR calc non Af Amer: 97 mL/min/{1.73_m2} (ref >59)
GLUCOSE: 91 mg/dL (ref 65–99)
Potassium: 4.9 mmol/L (ref 3.5–5.2)
SODIUM: 131 mmol/L — AB (ref 134–144)

## 2018-01-17 LAB — CBC
HEMOGLOBIN: 13.5 g/dL (ref 13.0–17.7)
Hematocrit: 38.2 % (ref 37.5–51.0)
MCH: 31.3 pg (ref 26.6–33.0)
MCHC: 35.3 g/dL (ref 31.5–35.7)
MCV: 88 fL (ref 79–97)
Platelets: 157 10*3/uL (ref 150–450)
RBC: 4.32 x10E6/uL (ref 4.14–5.80)
RDW: 11.9 % — AB (ref 12.3–15.4)
WBC: 4.9 10*3/uL (ref 3.4–10.8)

## 2018-01-17 LAB — PRO B NATRIURETIC PEPTIDE: NT-Pro BNP: 34 pg/mL (ref 0–376)

## 2018-01-17 NOTE — Telephone Encounter (Signed)
Left message to go over lab results.  

## 2018-01-17 NOTE — Telephone Encounter (Signed)
-----   Message from Liliane Shi, [place] sent at 01/17/2018  1:36 PM EDT ----- Renal function normal.  The sodium is stable (mildly low for over a year).  The calcium is low.  The hemoglobin is normal.  BNP is normal.   Recommendations:  - Continue current medications and follow up as planned.   - Send copy to PCP.  - Follow up with PCP for low calcium.  Richardson Dopp, PA-C    01/17/2018 1:32 PM

## 2018-01-19 NOTE — Telephone Encounter (Signed)
Pt has been notified of lab results by phone with verbal understanding. Pt advised to f/u w/PCP due to low calcium level. Pt is agreeable to plan of care and thanked me for the call.

## 2018-01-19 NOTE — Telephone Encounter (Signed)
-----   Message from Liliane Shi, [place] sent at 01/17/2018  1:36 PM EDT ----- Renal function normal.  The sodium is stable (mildly low for over a year).  The calcium is low.  The hemoglobin is normal.  BNP is normal.   Recommendations:  - Continue current medications and follow up as planned.   - Send copy to PCP.  - Follow up with PCP for low calcium.  Richardson Dopp, PA-C    01/17/2018 1:32 PM

## 2018-01-23 ENCOUNTER — Telehealth: Payer: Self-pay | Admitting: Physician Assistant

## 2018-01-23 NOTE — Telephone Encounter (Signed)
DPR ok to s/w pt's wife Jackelyn Poling who has been advised of recommendations. Per Chanetta Marshall, NP no changes to be made with pacemaker settings in regards to pt's sob. Advised we will continue on current Tx plan. Confirmed appt 02/19/18 with Dr. Acie Fredrickson. Pt's wife thanked me for the call.

## 2018-01-23 NOTE — Telephone Encounter (Signed)
Please tell Mr. Felkins that I reviewed his case with Chanetta Marshall, NP. She reviewed his pacer settings.  She did not think there were any changes that needed to be made in regards to his shortness of breath.   Continue current medications and follow up as planned.  Richardson Dopp, PA-C    01/23/2018 10:13 AM

## 2018-02-19 ENCOUNTER — Ambulatory Visit (INDEPENDENT_AMBULATORY_CARE_PROVIDER_SITE_OTHER): Payer: Medicare Other | Admitting: Cardiovascular Disease

## 2018-02-19 ENCOUNTER — Encounter: Payer: Self-pay | Admitting: Cardiovascular Disease

## 2018-02-19 VITALS — BP 130/72 | HR 61 | Ht 65.0 in | Wt 190.0 lb

## 2018-02-19 DIAGNOSIS — I2510 Atherosclerotic heart disease of native coronary artery without angina pectoris: Secondary | ICD-10-CM | POA: Diagnosis not present

## 2018-02-19 DIAGNOSIS — E782 Mixed hyperlipidemia: Secondary | ICD-10-CM

## 2018-02-19 DIAGNOSIS — I25119 Atherosclerotic heart disease of native coronary artery with unspecified angina pectoris: Secondary | ICD-10-CM | POA: Diagnosis not present

## 2018-02-19 MED ORDER — ISOSORBIDE MONONITRATE ER 30 MG PO TB24: 30.0000 mg | ORAL_TABLET | Freq: Every day | ORAL | 3 refills | Status: DC

## 2018-02-19 NOTE — Progress Notes (Signed)
Cardiology Office Note:    Date:  02/19/2018   ID:  Aaron Keller, DOB 1945-04-19, MRN [id number]  PCP:  Antony Contras, MD  Cardiologist:  Mertie Moores, MD   Electrophysiologist:  Thompson Grayer, MD   Referring MD: Antony Contras, MD   Chief Complaint  Patient presents with  . Coronary Artery Disease     Problem List 1. CAD - distal LAD disease 2. CVA 3. HTN 4. Hyperlipidemia  5.  Encephalitis - Herpetic encephalitis    previous notes from [place], [place]    Aaron Keller is a 72 y.o. male with coronary artery disease, sick sinus syndrome status post pacemaker, hypertension, hyperlipidemia, prior stroke, encephalitis.  Cardiac catheterization in 2018 demonstrated severe distal LAD disease and moderate nonobstructive disease elsewhere.  He has been managed medically.  He was last seen by Dr. Saunders Revel in September 2019 with complaints of chest pain.  Nuclear stress test was obtained and demonstrated normal perfusion with normal LV function.  Therefore, medical therapy was continued.     Aaron Keller returns for follow-up.  He is here with his wife.  We discussed the results of his stress test.  He is mainly troubled by shortness of breath with any type of exertion.  This has been ongoing for months.  It does not seem to be getting any worse.  He has not had paroxysmal nocturnal dyspnea or orthopnea.  He does use his CPAP at night.  However, he is only able to keep it on about 4 hours a night.  He has not had significant leg swelling.  He has not had any further chest discomfort.  He has not had any syncope.  He denies melena, hematochezia, hematuria.  He denies any coughing or wheezing.  He has a remote history of tobacco use.  February 19, 2018:  Aaron Keller is seen today for follow-up.  He is a transfer from Dr. Saunders Revel.     He was last seen by Richardson Dopp, PA.  He is not getting any regular exercise. He has lots of DOE with any exercise ( yard work )  Was started on Imdur 15 mg a day  from [place]  Prior CV studies:   The following studies were reviewed today:  Nuclear stress test 12/28/17 EF 64, normal perfusion, low risk  Renal artery ultrasound 12/30/2016 FINAL INTERPRETATION  Renal Normal size right renal artery. Normal right RI. Normal cortical thickness of right renal artery. No evidence of right renal artery stenosis.  Normal size of left renal artery. Normal left RI. Normal cortical thickness of the left renal artery. No evidence of left renal artery stenosis.  Mesenteric Normal Celiac artery and Superior Mesenteric artery findings.   Event monitor 04/2016  Patient was monitored for 30 days.  The predominant rhythm was normal sinus rhythm with an average rate of 65 bpm (range 38 - 104 bpm).  There daytime and nocternal episodes of sinus pauses with junctional escape beats. Longest R-R interval was 3.8 seconds.  Rare PACs were noted. There were no sustained tachyarrhythmias.  Patient triggered events correspond to sinus rhythm, sinsus rhythm with PACs, and sinus pauses. Sinus rhythm with sinus pauses and junctional escape. Longest R-R interval 3.8 seconds.  Exercise tolerance test 04/28/2016  Blood pressure demonstrated a hypertensive response to exercise.  There was no ST segment deviation noted during stress.  No T wave inversion was noted during stress. Inconclusive study due to inadequate achieved heart rate (76% max). No ischemia for the  workload achieved.  Echocardiogram 04/02/2016 Moderate concentric LVH, EF 65-70, normal wall motion, grade 1 diastolic dysfunction, mild AI, trivial TR  Cardiac catheterization 04/01/2016 LAD mid 35, distal 70/90 LPDA ostial 50 RCA mid 6 EF 55-65     Past Medical History:  Diagnosis Date  . CAD (coronary artery disease) 04/04/2016   LHC 1/18: mLAD 35, dLAD 70/90, oLPDA 50, mRCA 60, EF 55-65 // Nuc Stress 10/19:  EF 64, normal perfusion, low risk  . Echocardiogram    Echo 1/18: Moderate  concentric LVH, EF 65-70, normal wall motion, grade 1 diastolic dysfunction, mild AI, trivial TR  . Encephalitis 02/18/1985  . Hepatitis ~ 1959   "don't know what kind"  . HTN (hypertension) 04/04/2016  . Hx of tonic-clonic seizures 02/18/1985 X 1   related to encephalitis  . Hypertension   . Nasal bleeding    "because of one of the RX I was on" (06/29/2016)  . Obesity (BMI 30-39.9) 06/15/2017  . OSA (obstructive sleep apnea) 06/15/2017   Moderate with AHI 16.6/hr now on CPAP at 12cm H2O  . Presence of permanent cardiac pacemaker   . Seasonal allergies   . Sick sinus syndrome ([place])    s/p dual chamber pacemaker in 2018  . Stroke [facility])    "I've had 2 minor ones"; denies residual on 06/28/2016   Surgical Hx: The patient  has a past surgical history that includes Appendectomy; Fracture surgery; Wrist fracture surgery (Left, 1950s); Elbow surgery (Right); Inguinal hernia repair (Right, 08/2010); Cataract extraction w/ intraocular lens  implant, bilateral (Bilateral, ~ 2007); Cardiac catheterization (N/A, 04/01/2016); Cardiac catheterization (N/A, 04/01/2016); PACEMAKER IMPLANT (N/A, 06/28/2016); and PACEMAKER IMPLANT (06/28/2016).   Current Medications: Current Meds  Medication Sig  . amLODipine (NORVASC) 10 MG tablet Take 10 mg by mouth daily.  Marland Kitchen atorvastatin (LIPITOR) 10 MG tablet Take 1 tablet (10 mg total) by mouth daily.  . carbamazepine (TEGRETOL) 200 MG tablet Take 200 mg by mouth 4 (four) times daily.   . carvedilol (COREG) 12.5 MG tablet Take 1 tablet (12.5 mg total) by mouth 2 (two) times daily.  . citalopram (CELEXA) 40 MG tablet Take 40 mg by mouth daily.  . clorazepate (TRANXENE-T) 3.75 MG tablet Take 3.75 mg by mouth 2 (two) times daily.    Marland Kitchen dipyridamole-aspirin (AGGRENOX) 200-25 MG 12hr capsule Take 2 capsules by mouth at bedtime.   . fluticasone (FLONASE) 50 MCG/ACT nasal spray Place 2 sprays into both nostrils at bedtime as needed for allergies or rhinitis.  . folic acid  (FOLVITE) 1 MG tablet Take 1 mg by mouth daily.    Marland Kitchen gabapentin (NEURONTIN) 800 MG tablet Take 800 mg by mouth 2 (two) times daily.  Marland Kitchen lisinopril (PRINIVIL,ZESTRIL) 40 MG tablet Take 1 tablet by mouth daily  . nitroGLYCERIN (NITROSTAT) 0.4 MG SL tablet Place 1 tablet (0.4 mg total) under the tongue every 5 (five) minutes x 3 doses as needed for chest pain.  Marland Kitchen spironolactone (ALDACTONE) 25 MG tablet Take 0.5 tablets (12.5 mg total) by mouth daily.  . [DISCONTINUED] isosorbide mononitrate (IMDUR) 30 MG 24 hr tablet Take 0.5 tablets (15 mg total) by mouth daily.     Allergies:   Cyclosporine and Serzone [nefazodone hcl]   Social History   Tobacco Use  . Smoking status: Former Smoker    Years: 1.00  . Smokeless tobacco: Never Used  . 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 Alcohol use: No  .  Drug use: No     Family Hx: The patient's family history includes Cancer in his father; Heart attack in his mother; Heart disease in his mother.  ROS:   Please see the history of present illness.    ROS All other systems reviewed and are negative.   EKGs/Labs/Other Test Reviewed:    EKG:  EKG is not ordered today.    Recent Labs: 01/16/2018: BUN 9; Creatinine, Ser 0.66; Hemoglobin 13.5; NT-Pro BNP 34; Platelets 157; Potassium 4.9; Sodium 131   Recent Lipid Panel Lab Results  Component Value Date/Time   CHOL 102 04/02/2016 05:00 AM   TRIG 97 04/02/2016 05:00 AM   HDL 40 (L) 04/02/2016 05:00 AM   CHOLHDL 2.6 04/02/2016 05:00 AM   LDLCALC 43 04/02/2016 05:00 AM   From [facility] Tool: Cholesterol, total 128.000 08/18/2017 HDL 47.000 08/18/2017 LDL 58.000 08/18/2017 Triglycerides 119.000 08/18/2017 Hemoglobin 14.300 08/18/2017 Creatinine, Serum 0.670 10/04/2017 Potassium 5.000 10/04/2017 Magnesium N/D ALT (SGPT) 14.000 08/18/2017 TSH 3.510 08/18/2017 INR 0.990 04/01/2016 Platelets 124.000 06/28/2016  Physical Exam:    Physical Exam: Blood  pressure 130/72, pulse 61, height 5\' 5"  (1.651 m), weight 190 lb (86.2 kg), SpO2 97 %.  GEN:  Well nourished, well developed in no acute distress HEENT: Normal NECK: No JVD; No carotid bruits LYMPHATICS: No lymphadenopathy CARDIAC: RRR , no murmurs, rubs, gallops RESPIRATORY:  Clear to auscultation without rales, wheezing or rhonchi  ABDOMEN: Soft, non-tender, non-distended MUSCULOSKELETAL:  No edema; No deformity  SKIN: Warm and dry NEUROLOGIC:  Alert and oriented x 3    ASSESSMENT & PLAN:    Shortness of breath   Coronary artery disease involving native coronary artery of native heart with angina pectoris [facility]) Cardiac catheterization in January 2018 demonstrated severe distal disease in the LAD.  There was moderate nonobstructive disease elsewhere.  Recent nuclear stress test demonstrated normal perfusion and was low risk.  He still has occasional episodes of severe shortness of breath which may be his anginal equivalent.  His carvedilol dose appears to be adequate.  His resting heart rate is 61.  We will increase the isosorbide to 30 mg a day.  We can increase it further to 60 mg a day if he tolerates.  Essential hypertension Pressures well controlled.  Hyperlipidemia LDL goal <70 Most recent lipids from his primary medical doctor's office reveals a total cholesterol of 128.  His HDL is 47.  His LDL is 58.  His triglyceride level is 119.  Cardiac pacemaker in situ   Followed by EP      Medication Adjustments/Labs and Tests Ordered: Current medicines are reviewed at length with the patient today.  Concerns regarding medicines are outlined above.  Tests Ordered: Orders Placed This Encounter  Procedures  . Lipid Profile  . Basic Metabolic Panel (BMET)  . Hepatic function panel   Medication Changes: Meds ordered this encounter  Medications  . DISCONTD: isosorbide mononitrate (IMDUR) 30 MG 24 hr tablet    Sig: Take 1 tablet (30 mg total) by mouth daily.    Dispense:   90 tablet    Refill:  3  . isosorbide mononitrate (IMDUR) 30 MG 24 hr tablet    Sig: Take 1 tablet (30 mg total) by mouth daily.    Dispense:  90 tablet    Refill:  3    Signed, Mertie Moores, MD  02/19/2018 5:30 PM    [address], [place], [place]  [address] Phone: [phone]; Fax: [phone]    

## 2018-02-19 NOTE — Patient Instructions (Signed)
Medication Instructions:  Your physician has recommended you make the following change in your medication:   INCREASE Imdur (Isosorbide) to 30 mg once daily  If you need a refill on your cardiac medications before your next appointment, please call your pharmacy.    Lab work: Your physician recommends that you return for lab work in: 3 months on the same day or a few days before your office visit You will need to FAST for this appointment - nothing to eat or drink after midnight the night before except water.    Testing/Procedures: None Ordered   Follow-Up: At [facility], you and your health needs are our priority.  As part of our continuing mission to provide you with exceptional heart care, we have created designated Provider Care Teams.  These Care Teams include your primary Cardiologist (physician) and Advanced Practice Providers (APPs -  Physician Assistants and Nurse Practitioners) who all work together to provide you with the care you need, when you need it. You will need a follow up appointment in:  3 months.  Please call our office 2 months in advance to schedule this appointment.  You may see Mertie Moores, MD or one of the following Advanced Practice Providers on your designated Care Team: Richardson Dopp, [place]

## 2018-03-05 DIAGNOSIS — I6350 Cerebral infarction due to unspecified occlusion or stenosis of unspecified cerebral artery: Secondary | ICD-10-CM | POA: Diagnosis not present

## 2018-03-05 DIAGNOSIS — G250 Essential tremor: Secondary | ICD-10-CM | POA: Diagnosis not present

## 2018-03-05 DIAGNOSIS — G40209 Localization-related (focal) (partial) symptomatic epilepsy and epileptic syndromes with complex partial seizures, not intractable, without status epilepticus: Secondary | ICD-10-CM | POA: Diagnosis not present

## 2018-03-05 DIAGNOSIS — B004 Herpesviral encephalitis: Secondary | ICD-10-CM | POA: Diagnosis not present

## 2018-03-11 LAB — CUP PACEART REMOTE DEVICE CHECK
Battery Remaining Percentage: 95.5 %
Battery Voltage: 3.01 V
Brady Statistic AP VP Percent: 1 %
Brady Statistic AP VS Percent: 91 %
Brady Statistic AS VP Percent: 0 %
Brady Statistic AS VS Percent: 9.3 %
Brady Statistic RA Percent Paced: 90 %
Brady Statistic RV Percent Paced: 1 %
Date Time Interrogation Session: 20191021060014
Implantable Lead Implant Date: 20180410
Implantable Lead Implant Date: 20180410
Implantable Lead Location: 753860
Implantable Pulse Generator Implant Date: 20180410
Lead Channel Impedance Value: 490 Ohm
Lead Channel Impedance Value: 590 Ohm
Lead Channel Pacing Threshold Amplitude: 0.75 V
Lead Channel Pacing Threshold Amplitude: 0.75 V
Lead Channel Pacing Threshold Pulse Width: 0.5 ms
Lead Channel Pacing Threshold Pulse Width: 0.5 ms
Lead Channel Sensing Intrinsic Amplitude: 2 mV
Lead Channel Sensing Intrinsic Amplitude: 9.7 mV
Lead Channel Setting Pacing Amplitude: 2 V
Lead Channel Setting Pacing Amplitude: 2.5 V
Lead Channel Setting Pacing Pulse Width: 0.5 ms
Lead Channel Setting Sensing Sensitivity: 2 mV
MDC IDC LEAD LOCATION: 753859
MDC IDC MSMT BATTERY REMAINING LONGEVITY: 117 mo
Pulse Gen Model: 2272
Pulse Gen Serial Number: 8016790

## 2018-03-26 DIAGNOSIS — J0110 Acute frontal sinusitis, unspecified: Secondary | ICD-10-CM | POA: Diagnosis not present

## 2018-04-09 ENCOUNTER — Ambulatory Visit (INDEPENDENT_AMBULATORY_CARE_PROVIDER_SITE_OTHER): Payer: Medicare Other

## 2018-04-09 DIAGNOSIS — R001 Bradycardia, unspecified: Secondary | ICD-10-CM | POA: Diagnosis not present

## 2018-04-09 DIAGNOSIS — I495 Sick sinus syndrome: Secondary | ICD-10-CM

## 2018-04-10 LAB — CUP PACEART REMOTE DEVICE CHECK
Battery Remaining Longevity: 116 mo
Battery Remaining Percentage: 95.5 %
Battery Voltage: 3.01 V
Brady Statistic AP VS Percent: 93 %
Brady Statistic AS VP Percent: 0 %
Brady Statistic AS VS Percent: 7.3 %
Brady Statistic RA Percent Paced: 92 %
Brady Statistic RV Percent Paced: 1 %
Date Time Interrogation Session: 20200120070019
Implantable Lead Implant Date: 20180410
Implantable Lead Implant Date: 20180410
Implantable Lead Location: 753859
Implantable Lead Location: 753860
Implantable Pulse Generator Implant Date: 20180410
Lead Channel Impedance Value: 490 Ohm
Lead Channel Impedance Value: 560 Ohm
Lead Channel Pacing Threshold Amplitude: 0.75 V
Lead Channel Pacing Threshold Amplitude: 0.75 V
Lead Channel Pacing Threshold Pulse Width: 0.5 ms
Lead Channel Pacing Threshold Pulse Width: 0.5 ms
Lead Channel Sensing Intrinsic Amplitude: 2.1 mV
Lead Channel Setting Pacing Amplitude: 2 V
Lead Channel Setting Pacing Amplitude: 2.5 V
Lead Channel Setting Pacing Pulse Width: 0.5 ms
Lead Channel Setting Sensing Sensitivity: 2 mV
MDC IDC MSMT LEADCHNL RV SENSING INTR AMPL: 7.6 mV
MDC IDC STAT BRADY AP VP PERCENT: 1 %
Pulse Gen Model: 2272
Pulse Gen Serial Number: 8016790

## 2018-04-10 NOTE — Progress Notes (Signed)
Remote pacemaker transmission.   

## 2018-05-16 ENCOUNTER — Other Ambulatory Visit: Payer: Medicare Other | Admitting: *Deleted

## 2018-05-16 DIAGNOSIS — E782 Mixed hyperlipidemia: Secondary | ICD-10-CM

## 2018-05-16 DIAGNOSIS — I2510 Atherosclerotic heart disease of native coronary artery without angina pectoris: Secondary | ICD-10-CM

## 2018-05-17 ENCOUNTER — Telehealth: Payer: Self-pay | Admitting: Nurse Practitioner

## 2018-05-17 DIAGNOSIS — E782 Mixed hyperlipidemia: Secondary | ICD-10-CM

## 2018-05-17 DIAGNOSIS — I2510 Atherosclerotic heart disease of native coronary artery without angina pectoris: Secondary | ICD-10-CM

## 2018-05-17 DIAGNOSIS — E781 Pure hyperglyceridemia: Secondary | ICD-10-CM

## 2018-05-17 LAB — LIPID PANEL
Chol/HDL Ratio: 3 ratio (ref 0.0–5.0)
Cholesterol, Total: 123 mg/dL (ref 100–199)
HDL: 41 mg/dL (ref >39)
LDL Calculated: 27 mg/dL (ref 0–99)
Triglycerides: 277 mg/dL — ABNORMAL HIGH (ref 0–149)
VLDL Cholesterol Cal: 55 mg/dL — ABNORMAL HIGH (ref 5–40)

## 2018-05-17 MED ORDER — FENOFIBRATE 145 MG PO TABS: 145.0000 mg | ORAL_TABLET | Freq: Every day | ORAL | 3 refills | Status: DC

## 2018-05-17 NOTE — Telephone Encounter (Signed)
-----   Message from Thayer Headings, MD sent at 05/17/2018  7:23 AM EST ----- LDL looks good but Triglyceride levels are significantly higher He needs to greatly reduce his intake of carbs and dietary fats He should start a regular exercise program  Start Fenofibrate 135 mg a day  Recheck lipids, liver, BMP in 3 months

## 2018-05-17 NOTE — Telephone Encounter (Signed)
Results and plan of care reviewed with patient's wife, Jackelyn Poling, per [facility]. She agrees that patient will start fenofibrate 145 mg once daily. She requests Rx be sent to [facility] for #90. I advised that additional lab results should be available when he comes in for his appointment on 3/2 with Richardson Dopp, PA. I asked her to have him schedule his 3 month fasting lab appointment at that time and to call back sooner if he has questions or concerns. She verbalized understanding and agreement and thanked me for the call.

## 2018-05-19 LAB — SPECIMEN STATUS REPORT

## 2018-05-21 ENCOUNTER — Ambulatory Visit (INDEPENDENT_AMBULATORY_CARE_PROVIDER_SITE_OTHER): Payer: Medicare Other | Admitting: Physician Assistant

## 2018-05-21 ENCOUNTER — Encounter: Payer: Self-pay | Admitting: Physician Assistant

## 2018-05-21 VITALS — BP 126/64 | HR 86 | Ht 65.0 in | Wt 189.0 lb

## 2018-05-21 DIAGNOSIS — Z950 Presence of cardiac pacemaker: Secondary | ICD-10-CM | POA: Diagnosis not present

## 2018-05-21 DIAGNOSIS — R0602 Shortness of breath: Secondary | ICD-10-CM

## 2018-05-21 DIAGNOSIS — E785 Hyperlipidemia, unspecified: Secondary | ICD-10-CM

## 2018-05-21 DIAGNOSIS — I10 Essential (primary) hypertension: Secondary | ICD-10-CM | POA: Diagnosis not present

## 2018-05-21 DIAGNOSIS — I25119 Atherosclerotic heart disease of native coronary artery with unspecified angina pectoris: Secondary | ICD-10-CM | POA: Diagnosis not present

## 2018-05-21 MED ORDER — ISOSORBIDE MONONITRATE ER 30 MG PO TB24: 30.0000 mg | ORAL_TABLET | Freq: Every day | ORAL | 3 refills | Status: AC

## 2018-05-21 NOTE — Patient Instructions (Signed)
Medication Instructions:   Your physician recommends that you continue on your current medications as directed. Please refer to the Current Medication list given to you today.   If you need a refill on your cardiac medications before your next appointment, please call your pharmacy.   Lab work:  RETURN FOR FASTING LIPIDS AND DO NOT TAKE YOUR FENOFIBRATE UNTIL AFTER YOU HAVE GOTTEN YOU RESULTS      If you have labs (blood work) drawn today and your tests are completely normal, you will receive your results only by: Marland Kitchen MyChart Message (if you have MyChart) OR . A paper copy in the mail If you have any lab test that is abnormal or we need to change your treatment, we will call you to review the results.  Testing/Procedures: NONE ORDERED  TODAY    Follow-Up: At [facility], you and your health needs are our priority.  As part of our continuing mission to provide you with exceptional heart care, we have created designated Provider Care Teams.  These Care Teams include your primary Cardiologist (physician) and Advanced Practice Providers (APPs -  Physician Assistants and Nurse Practitioners) who all work together to provide you with the care you need, when you need it. You will need a follow up appointment in:  3 months.  You may see Mertie Moores, MD or one of the following Advanced Practice Providers on your designated Care Team: Richardson Dopp, PA-C  Any Other Special Instructions Will Be Listed Below (If Applicable).

## 2018-05-21 NOTE — Progress Notes (Signed)
Cardiology Office Note:    Date:  05/21/2018   ID:  Aaron Keller, DOB 17-Nov-1945, MRN [id number]  PCP:  Aaron Contras, MD  Cardiologist:  Aaron Moores, MD   Electrophysiologist:  Aaron Grayer, MD  Neurologist:  Dr. Sandrea Keller ([place], [place], [place])  Referring MD: Aaron Contras, MD   Chief Complaint  Patient presents with  . Follow-up    CAD, shortness of breath     History of Present Illness:    Aaron Keller is a 73 y.o. male with coronary artery disease, sick sinus syndrome status post pacemaker, hypertension, hyperlipidemia, prior stroke, encephalitis.  Cardiac catheterization in 2018 demonstrated severe distal LAD disease and moderate nonobstructive disease elsewhere.  He has been managed medically.  Myoview in 12/2017 demonstrated normal perfusion.  I saw him in 12/2017 for follow up. He complained of shortness of breath.  His pacer settings were reviewed but there were no settings that could contribute to shortness of breath.  I placed him on Isosorbide.  He saw Aaron Keller in 02/2018 and he increased his Isosorbide further.     Mr. Aaron Keller returns for follow up.  He is here with his wife.  He has not noticed much shortness of breath recently.  However, he has not been doing the typical activities that make him short of breath.  He will start doing those in the spring.  Most of the activities that make him short of breath occur with yard work, Social research officer, government. He has not had chest pain, syncope, paroxysmal nocturnal dyspnea, edema.  He is concerned about his high Triglyceride level on recent labs.  But, he was not fasting for the lab draw.    Prior CV studies:   The following studies were reviewed today:  Nuclear stress test 12/28/17 EF 64, normal perfusion, low risk  Renal artery ultrasound 12/30/2016 FINAL INTERPRETATION  Renal Normal size right renal artery. Normal right RI. Normal cortical thickness of right renal artery. No evidence of right renal artery  stenosis.  Normal size of left renal artery. Normal left RI. Normal cortical thickness of the left renal artery. No evidence of left renal artery stenosis.  Mesenteric Normal Celiac artery and Superior Mesenteric artery findings.   Event monitor 04/2016  Patient was monitored for 30 days.  The predominant rhythm was normal sinus rhythm with an average rate of 65 bpm (range 38 - 104 bpm).  There daytime and nocternal episodes of sinus pauses with junctional escape beats. Longest R-R interval was 3.8 seconds.  Rare PACs were noted. There were no sustained tachyarrhythmias.  Patient triggered events correspond to sinus rhythm, sinsus rhythm with PACs, and sinus pauses. Sinus rhythm with sinus pauses and junctional escape. Longest R-R interval 3.8 seconds.  Exercise tolerance test 04/28/2016  Blood pressure demonstrated a hypertensive response to exercise.  There was no ST segment deviation noted during stress.  No T wave inversion was noted during stress. Inconclusive study due to inadequate achieved heart rate (76% max). No ischemia for the workload achieved.  Echocardiogram 04/02/2016 Moderate concentric LVH, EF 65-70, normal wall motion, grade 1 diastolic dysfunction, mild AI, trivial TR  Cardiac catheterization 04/01/2016 LAD mid 35, distal 70/90 LPDA ostial 50 RCA mid 79 EF 55-65    Past Medical History:  Diagnosis Date  . CAD (coronary artery disease) 04/04/2016   LHC 1/18: mLAD 35, dLAD 70/90, oLPDA 50, mRCA 60, EF 55-65 // Nuc Stress 10/19:  EF 64, normal perfusion, low risk  . Echocardiogram  Echo 1/18: Moderate concentric LVH, EF 65-70, normal wall motion, grade 1 diastolic dysfunction, mild AI, trivial TR  . Encephalitis 02/18/1985  . Hepatitis ~ 1959   "don't know what kind"  . HTN (hypertension) 04/04/2016  . Hx of tonic-clonic seizures 02/18/1985 X 1   related to encephalitis  . Hypertension   . Nasal bleeding    "because of one of the RX I was  on" (06/29/2016)  . Obesity (BMI 30-39.9) 06/15/2017  . OSA (obstructive sleep apnea) 06/15/2017   Moderate with AHI 16.6/hr now on CPAP at 12cm H2O  . Presence of permanent cardiac pacemaker   . Seasonal allergies   . Sick sinus syndrome ([place])    s/p dual chamber pacemaker in 2018  . Stroke [facility])    "I've had 2 minor ones"; denies residual on 06/28/2016   Surgical Hx: The patient  has a past surgical history that includes Appendectomy; Fracture surgery; Wrist fracture surgery (Left, 1950s); Elbow surgery (Right); Inguinal hernia repair (Right, 08/2010); Cataract extraction w/ intraocular lens  implant, bilateral (Bilateral, ~ 2007); Cardiac catheterization (N/A, 04/01/2016); Cardiac catheterization (N/A, 04/01/2016); PACEMAKER IMPLANT (N/A, 06/28/2016); and PACEMAKER IMPLANT (06/28/2016).   Current Medications: Current Meds  Medication Sig  . amLODipine (NORVASC) 10 MG tablet Take 10 mg by mouth daily.  [place] atorvastatin (LIPITOR) 10 MG tablet Take 1 tablet (10 mg total) by mouth daily.  . carbamazepine (TEGRETOL) 200 MG tablet Take 200 mg by mouth 4 (four) times daily.   . carvedilol (COREG) 12.5 MG tablet Take 1 tablet (12.5 mg total) by mouth 2 (two) times daily.  . citalopram (CELEXA) 40 MG tablet Take 40 mg by mouth daily.  . clorazepate (TRANXENE-T) 3.75 MG tablet Take 3.75 mg by mouth 2 (two) times daily.    [place] Kitchen dipyridamole-aspirin (AGGRENOX) 200-25 MG 12hr capsule Take 2 capsules by mouth at bedtime.   . fluticasone (FLONASE) 50 MCG/ACT nasal spray Place 2 sprays into both nostrils at bedtime as needed for allergies or rhinitis.  . folic acid (FOLVITE) 1 MG tablet Take 1 mg by mouth daily.    [place] Kitchen gabapentin (NEURONTIN) 800 MG tablet Take 800 mg by mouth 2 (two) times daily.  . isosorbide mononitrate (IMDUR) 30 MG 24 hr tablet Take 1 tablet (30 mg total) by mouth daily.  [place] Kitchen lisinopril (PRINIVIL,ZESTRIL) 40 MG tablet Take 1 tablet by mouth daily  . nitroGLYCERIN (NITROSTAT) 0.4 MG SL tablet  Place 1 tablet (0.4 mg total) under the tongue every 5 (five) minutes x 3 doses as needed for chest pain.  [place] Kitchen spironolactone (ALDACTONE) 25 MG tablet Take 0.5 tablets (12.5 mg total) by mouth daily.  . [DISCONTINUED] isosorbide mononitrate (IMDUR) 30 MG 24 hr tablet Take 1 tablet (30 mg total) by mouth daily.     Allergies:   Cyclosporine and Serzone [nefazodone hcl]   Social History   Tobacco Use  . Smoking status: Former Smoker    Years: 1.00  . Smokeless tobacco: Never Used  . 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 Alcohol use: No  . Drug use: No     Family Hx: The patient's family history includes Cancer in his father; Heart attack in his mother; Heart disease in his mother.  ROS:   Please see the history of present illness.    Review of Systems  Cardiovascular: Positive for dyspnea on exertion.   All other systems reviewed and are negative.   EKGs/Labs/Other Test  Reviewed:    EKG:  EKG is not ordered today.    Recent Labs: 01/16/2018: Hemoglobin 13.5; NT-Pro BNP 34; Platelets 157 05/16/2018: ALT 17; BUN 8; Creatinine, Ser 0.72; Potassium 5.1; Sodium 128   Recent Lipid Panel Lab Results  Component Value Date/Time   CHOL 123 05/16/2018 01:15 PM   TRIG 277 (H) 05/16/2018 01:15 PM   HDL 41 05/16/2018 01:15 PM   CHOLHDL 3.0 05/16/2018 01:15 PM   CHOLHDL 2.6 04/02/2016 05:00 AM   LDLCALC 27 05/16/2018 01:15 PM    Physical Exam:    VS:  BP 126/64   Pulse 86   Ht 5\' 5"  (1.651 m)   Wt 189 lb (85.7 kg)   SpO2 96%   BMI 31.45 kg/m     Wt Readings from Last 3 Encounters:  05/21/18 189 lb (85.7 kg)  02/19/18 190 lb (86.2 kg)  01/16/18 188 lb 12.8 oz (85.6 kg)     Physical Exam  Constitutional: He is oriented to person, place, and time. He appears well-developed and well-nourished. No distress.  HENT:  Head: Normocephalic and atraumatic.  Eyes: No scleral icterus.  Neck: No JVD present. No thyromegaly  present.  Cardiovascular: Normal rate and regular rhythm.  No murmur heard. Pulmonary/Chest: Effort normal. He has no wheezes. He has no rales.  Abdominal: Soft. He exhibits no distension.  Musculoskeletal:        General: No edema.  Lymphadenopathy:    He has no cervical adenopathy.  Neurological: He is alert and oriented to person, place, and time.  Skin: Skin is warm and dry.  Psychiatric: He has a normal mood and affect.    ASSESSMENT & PLAN:    Coronary artery disease involving native coronary artery of native heart with angina pectoris [facility]) Cardiac catheterization in January 2018 demonstrated severe distal disease in the LAD.  There was moderate nonobstructive disease elsewhere.  Nuclear stress test in Oct 2019 demonstrated normal perfusion and was low risk.  He is not having chest pain.  His dyspnea on exertion may be an anginal equivalent.  He has had this shortness of breath for years without significant change.  Continue Amlodipine, Atorvastatin, Carvedilol, Isosorbide.    Shortness of breath As noted, we placed him in long acting nitrates to treat his shortness of breath as an anginal equivalent.  He has been stable without much shortness of breath since last seen. But, he does not typically do those activities that make him short of breath in the winter.  He will start to increase his activity as the weather turns warm.  He does not have a significant smoking hx.  He has normal LV function and mild diastolic dysfunction on recent Echo.  Pro-BNP in 12/2017 was normal.  He did have a protracted admission in the mid 1980s with encephalitis.  He has very minor residual weakness in his R arm.  He has minor residual weakness in his L arm from a stroke in the early 2000s.  He really does not have lower ext weakness.  His shortness of breath has been stable for years.  Therefore, I question if his symptoms may be due to deconditioning.  I offered a CPET to better define his shortness of  breath now vs waiting to see how his breathing is with the addition of the long acting nitrates once he increases his activity.  He would like to wait to see how he does first.    -Consider CPET in the future.  Cardiac pacemaker  in situ FU with EP as planned.  Essential hypertension The patient's blood pressure is controlled on his current regimen.  Continue current therapy.    Hyperlipidemia LDL goal <70  Recent Triglyceride level was high but he was not fasting.  I will arrange repeat fasting Lipids.  I have asked him to hold off on taking Fenofibrate until we have the repeat Lipid Panel results.     Dispo:  Return in about 3 months (around 08/21/2018) for Routine Follow Up, w/ Aaron Keller, or Richardson Dopp, PA-C.   Medication Adjustments/Labs and Tests Ordered: Current medicines are reviewed at length with the patient today.  Concerns regarding medicines are outlined above.  Tests Ordered: Orders Placed This Encounter  Procedures  . Lipid panel   Medication Changes: Meds ordered this encounter  Medications  . isosorbide mononitrate (IMDUR) 30 MG 24 hr tablet    Sig: Take 1 tablet (30 mg total) by mouth daily.    Dispense:  90 tablet    Refill:  3    Signed, Richardson Dopp, PA-C  05/21/2018 3:22 PM    [facility] [place], [place],   [address] Phone: [phone]; Fax: [phone]

## 2018-05-23 ENCOUNTER — Other Ambulatory Visit: Payer: Medicare Other | Admitting: *Deleted

## 2018-05-23 DIAGNOSIS — E785 Hyperlipidemia, unspecified: Secondary | ICD-10-CM | POA: Diagnosis not present

## 2018-05-23 LAB — LIPID PANEL
Chol/HDL Ratio: 2.3 ratio (ref 0.0–5.0)
Cholesterol, Total: 124 mg/dL (ref 100–199)
HDL: 53 mg/dL (ref >39)
LDL Calculated: 52 mg/dL (ref 0–99)
Triglycerides: 93 mg/dL (ref 0–149)
VLDL Cholesterol Cal: 19 mg/dL (ref 5–40)

## 2018-05-24 ENCOUNTER — Encounter: Payer: Self-pay | Admitting: *Deleted

## 2018-05-25 ENCOUNTER — Other Ambulatory Visit: Payer: Medicare Other

## 2018-06-01 ENCOUNTER — Telehealth: Payer: Self-pay | Admitting: Physician Assistant

## 2018-06-01 NOTE — Telephone Encounter (Signed)
Called patient about number for [place], main  Number [phone]. Fax number for Dr. Carie Caddy is [phone]. If you need to fax to pharmacy [phone]. Will forward numbers to [employer] PA.

## 2018-06-01 NOTE — Telephone Encounter (Signed)
Please call patient. I have called the number to the [place] several times so that I can send my recent office note to his provider and send a Rx for Imdur.  I have not been able to reach anyone at the number he provided me.  See if he has another way to contact his provider at the [facility] Richardson Dopp, PA-C    06/01/2018 1:38 PM '

## 2018-06-09 LAB — BASIC METABOLIC PANEL
BUN/Creatinine Ratio: 11 (ref 10–24)
BUN: 8 mg/dL (ref 8–27)
CALCIUM: 8.5 mg/dL — AB (ref 8.6–10.2)
CO2: 20 mmol/L (ref 20–29)
CREATININE: 0.72 mg/dL — AB (ref 0.76–1.27)
Chloride: 90 mmol/L — ABNORMAL LOW (ref 96–106)
GFR calc Af Amer: 108 mL/min/{1.73_m2} (ref >59)
GFR calc non Af Amer: 94 mL/min/{1.73_m2} (ref >59)
Glucose: 75 mg/dL (ref 65–99)
Potassium: 5.1 mmol/L (ref 3.5–5.2)
Sodium: 128 mmol/L — ABNORMAL LOW (ref 134–144)

## 2018-06-09 LAB — HEPATIC FUNCTION PANEL
ALT: 17 IU/L (ref 0–44)
AST: 11 IU/L (ref 0–40)
Albumin: 4.1 g/dL (ref 3.7–4.7)
Alkaline Phosphatase: 45 IU/L (ref 39–117)
Bilirubin Total: <0.2 mg/dL (ref 0.0–1.2)
Bilirubin, Direct: 0.07 mg/dL (ref 0.00–0.40)
Total Protein: 6.2 g/dL (ref 6.0–8.5)

## 2018-06-09 LAB — SPECIMEN STATUS REPORT

## 2018-07-02 ENCOUNTER — Telehealth: Payer: Self-pay | Admitting: Cardiology

## 2018-07-02 NOTE — Telephone Encounter (Signed)
New Message    Pt is calling about his appointment and is wondering if he is suppose to still come to this appointment or change it   Please call

## 2018-07-03 NOTE — Telephone Encounter (Signed)
Follow up    Pt is following up to see if he is suppose to come to the office visit appt he has on WED  Please call back

## 2018-07-03 NOTE — Telephone Encounter (Signed)
Called and spoke with patient and his wife who both gave verbal consent to do a Video visit tomorrow 07/04/2018 at 3:30pm with Dr Radford Pax.    TELEPHONE CALL NOTE  This patient has been deemed a candidate for follow-up tele-health visit to limit community exposure during the Covid-19 pandemic. I spoke with the patient via phone to discuss instructions. This has been outlined on the patient's AVS (dotphrase: [email or web address]). The patient was advised to review the section on consent for treatment as well. The patient will receive a phone call 2-3 days prior to their E-Visit at which time consent will be verbally confirmed. A Virtual Office Visit appointment type has been scheduled for 07/04/2018 at 3:30pm with Dr Radford Pax, with "VIDEO" or "TELEPHONE" in the appointment notes - patient prefers VIDEO type.  I have either confirmed the patient is active in MyChart or offered to send sign-up link to phone/email via Mychart icon beside patient's photo. ACTIVE  Sameen Leas, [facility] 07/03/2018 3:12 PM      Virtual Visit Pre-Appointment Phone Call   TELEPHONE CALL NOTE  BENTLY WYSS has been deemed a candidate for a follow-up tele-health visit to limit community exposure during the Covid-19 pandemic. I spoke with the patient via phone to ensure availability of phone/video source, confirm preferred email & phone number, and discuss instructions and expectations.  I reminded LOVIS MORE to be prepared with any vital sign and/or heart rhythm information that could potentially be obtained via home monitoring, at the time of his visit. I reminded DARLENE BARTELT to expect a phone call at the time of his visit if his visit.  Hollyann Pablo, [place] 07/03/2018 3:13 PM   DOWNLOADING THE WEBEX APP TO SMARTPHONE  - If Apple, ask patient to go to App Store and type in WebEx in the search bar. [place] [employer], the blue/green circle. If Android, go to [employer] and type in [employer] in the search  bar. The app is free but as with any other app downloads, their phone may require them to verify saved payment information or Apple/Android password.  - The patient does NOT have to create an account. - On the day of the visit, the assist will walk the patient through joining the meeting with the meeting number/password.  DOWNLOADING THE MYCHART APP TO SMARTPHONE  - If Apple, go to [employer] and type in MyChart in the search bar and download the app. If Android, ask patient to go to [employer] and type in [place] in the search bar and download the app. The app is free but as with any other app downloads, their phone may require them to verify saved payment information or Apple/Android password.  - The patient will need to then log into the app with their MyChart username and password, and select [facility] as their healthcare provider to link the account. When it is time for your visit, go to the MyChart app, find appointments, and click Begin Video Visit. Be sure to Select Allow for your device to access the Microphone and Camera for your visit. You will then be connected, and your provider will be with you shortly.  **If they have any issues connecting, or need assistance please contact [place] [phone])**  **If using a computer, in order to ensure the best quality for your visit they will need to use either of the following Internet Browsers: Manufacturing engineer, or Google Chrome**  [place] VISIT  I hereby voluntarily request, consent and authorize [facility] and its employed or contracted physicians, physician assistants, nurse practitioners or other licensed health care professionals (the Practitioner), to provide me with telemedicine health care services (the "Services") as deemed necessary by the treating Practitioner. I acknowledge and consent to receive the Services by the Practitioner via telemedicine. I understand that the  telemedicine visit will involve communicating with the Practitioner through live audiovisual communication technology and the disclosure of certain medical information by electronic transmission. I acknowledge that I have been given the opportunity to request an in-person assessment or other available alternative prior to the telemedicine visit and am voluntarily participating in the telemedicine visit.  I understand that I have the right to withhold or withdraw my consent to the use of telemedicine in the course of my care at any time, without affecting my right to future care or treatment, and that the Practitioner or I may terminate the telemedicine visit at any time. I understand that I have the right to inspect all information obtained and/or recorded in the course of the telemedicine visit and may receive copies of available information for a reasonable fee.  I understand that some of the potential risks of receiving the Services via telemedicine include:  [place] Kitchen Delay or interruption in medical evaluation due to technological equipment failure or disruption; . Information transmitted may not be sufficient (e.g. poor resolution of images) to allow for appropriate medical decision making by the Practitioner; and/or  . In rare instances, security protocols could fail, causing a breach of personal health information.  Furthermore, I acknowledge that it is my responsibility to provide information about my medical history, conditions and care that is complete and accurate to the best of my ability. I acknowledge that Practitioner's advice, recommendations, and/or decision may be based on factors not within their control, such as incomplete or inaccurate data provided by me or distortions of diagnostic images or specimens that may result from electronic transmissions. I understand that the practice of medicine is not an exact science and that Practitioner makes no warranties or guarantees regarding treatment  outcomes. I acknowledge that I will receive a copy of this consent concurrently upon execution via email to the email address I last provided but may also request a printed copy by calling the office of [facility].    I understand that my insurance will be billed for this visit.   I have read or had this consent read to me. . I understand the contents of this consent, which adequately explains the benefits and risks of the Services being provided via telemedicine.  . I have been provided ample opportunity to ask questions regarding this consent and the Services and have had my questions answered to my satisfaction. . I give my informed consent for the services to be provided through the use of telemedicine in my medical care  By participating in this telemedicine visit I agree to the above.

## 2018-07-04 ENCOUNTER — Other Ambulatory Visit: Payer: Self-pay

## 2018-07-04 ENCOUNTER — Telehealth: Payer: Self-pay | Admitting: *Deleted

## 2018-07-04 ENCOUNTER — Telehealth (INDEPENDENT_AMBULATORY_CARE_PROVIDER_SITE_OTHER): Payer: Medicare Other | Admitting: Cardiology

## 2018-07-04 ENCOUNTER — Encounter: Payer: Self-pay | Admitting: Cardiology

## 2018-07-04 VITALS — BP 141/85 | HR 72 | Ht 65.0 in | Wt 188.0 lb

## 2018-07-04 DIAGNOSIS — Z7189 Other specified counseling: Secondary | ICD-10-CM | POA: Diagnosis not present

## 2018-07-04 DIAGNOSIS — E669 Obesity, unspecified: Secondary | ICD-10-CM | POA: Diagnosis not present

## 2018-07-04 DIAGNOSIS — I10 Essential (primary) hypertension: Secondary | ICD-10-CM

## 2018-07-04 DIAGNOSIS — G4733 Obstructive sleep apnea (adult) (pediatric): Secondary | ICD-10-CM

## 2018-07-04 NOTE — Progress Notes (Signed)
Virtual Visit via Video Note   This visit type was conducted due to national recommendations for restrictions regarding the COVID-19 Pandemic (e.g. social distancing) in an effort to limit this patient's exposure and mitigate transmission in our community.  Due to his co-morbid illnesses, this patient is at least at moderate risk for complications without adequate follow up.  This format is felt to be most appropriate for this patient at this time.  All issues noted in this document were discussed and addressed.  A limited physical exam was performed with this format.  Please refer to the patient's chart for his consent to telehealth for [facility].  Evaluation Performed:  Follow-up visit  This visit type was conducted due to national recommendations for restrictions regarding the COVID-19 Pandemic (e.g. social distancing).  This format is felt to be most appropriate for this patient at this time.  All issues noted in this document were discussed and addressed.  No physical exam was performed (except for noted visual exam findings with Video Visits).  Please refer to the patient's chart (MyChart message for video visits and phone note for telephone visits) for the patient's consent to telehealth for [facility].  Date:  07/04/2018   ID:  Aaron Keller, DOB 24-Dec-1945, MRN [id number]  Patient Location:  Home  Provider location:   [place]  PCP:  Antony Contras, MD  Cardiologist:  Mertie Moores, MD  Sleep Medicine:  Fransico Him, MD Electrophysiologist:  Thompson Grayer, MD   Chief Complaint:  OSA  History of Present Illness:    Aaron Keller is a 73 y.o. male who presents via audio/video conferencing for a telehealth visit today.    Aaron Keller is a 73 y.o. male with a hx of CAD, hypertension, sick sinus syndrome status post permanent pacemaker placement who was referred for split-night sleep study by Dr. Saunders Revel.  He was having problems with snoring, poorly controlled HTN  and obesity.  He underwent PSG showing moderate obstructive sleep apnea with an AHI of 16.6/h.  He underwent CPAP titration to 12 cm of water pressure.    He is doing well with his CPAP device and thinks that he has gotten used to it.  He uses a Respironics dreamwear under the nose mask but it leaks when he turns over at night.  He was just sent a Respironics Dreamware nasal mask and feels the pressure is adequate.  Since going on CPAP he feels rested in the am and has no significant daytime sleepiness.  He denies any significant mouth or nasal dryness or nasal congestion.  He does not think that he snores.     The patient does not have symptoms concerning for COVID-19 infection (fever, chills, cough, or new shortness of breath).    Prior CV studies:   The following studies were reviewed today:  None  Past Medical History:  Diagnosis Date   CAD (coronary artery disease) 04/04/2016   LHC 1/18: mLAD 19, dLAD 70/90, oLPDA 50, mRCA 60, EF 55-65 // Nuc Stress 10/19:  EF 64, normal perfusion, low risk   Echocardiogram    Echo 1/18: Moderate concentric LVH, EF 65-70, normal wall motion, grade 1 diastolic dysfunction, mild AI, trivial TR   Encephalitis 02/18/1985   Hepatitis ~ 1959   "don't know what kind"   HTN (hypertension) 04/04/2016   Hx of tonic-clonic seizures 02/18/1985 X 1   related to encephalitis   Hypertension    Nasal bleeding    "because of one  of the RX I was on" (06/29/2016)   Obesity (BMI 30-39.9) 06/15/2017   OSA (obstructive sleep apnea) 06/15/2017   Moderate with AHI 16.6/hr now on CPAP at 12cm H2O   Presence of permanent cardiac pacemaker    Seasonal allergies    Sick sinus syndrome [facility])    s/p dual chamber pacemaker in 2018   Stroke [facility])    "I've had 2 minor ones"; denies residual on 06/28/2016   Past Surgical History:  Procedure Laterality Date   APPENDECTOMY     CARDIAC CATHETERIZATION N/A 04/01/2016   Procedure: Left Heart Cath and Coronary  Angiography;  Surgeon: Peter M Martinique, MD;  Location: [place] CV [facility];  Service: Cardiovascular;  Laterality: N/A;   CARDIAC CATHETERIZATION N/A 04/01/2016   Procedure: Temporary Pacemaker;  Surgeon: Peter M Martinique, MD;  Location: [place] CV LAB;  Service: Cardiovascular;  Laterality: N/A;   CATARACT EXTRACTION W/ INTRAOCULAR LENS  IMPLANT, BILATERAL Bilateral ~ 2007   ELBOW SURGERY Right    FRACTURE SURGERY     INGUINAL HERNIA REPAIR Right 08/2010   PACEMAKER IMPLANT N/A 06/28/2016   Procedure: Pacemaker Implant;  Surgeon: Thompson Grayer, MD;  Location: [place] CV LAB;  Service: Cardiovascular;  Laterality: N/A;   PACEMAKER IMPLANT  06/28/2016   SJM Assurity MRI PPM implanted by Dr Rayann Heman for sick sinus syndrome   WRIST FRACTURE SURGERY Left 1950s     Current Meds  Medication Sig   amLODipine (NORVASC) 10 MG tablet Take 10 mg by mouth daily.   atorvastatin (LIPITOR) 10 MG tablet Take 1 tablet (10 mg total) by mouth daily.   carbamazepine (TEGRETOL) 200 MG tablet Take 200 mg by mouth 4 (four) times daily.    carvedilol (COREG) 12.5 MG tablet Take 1 tablet (12.5 mg total) by mouth 2 (two) times daily.   citalopram (CELEXA) 40 MG tablet Take 40 mg by mouth daily.   clorazepate (TRANXENE-T) 3.75 MG tablet Take 3.75 mg by mouth 2 (two) times daily.     dipyridamole-aspirin (AGGRENOX) 200-25 MG 12hr capsule Take 2 capsules by mouth at bedtime.    fluticasone (FLONASE) 50 MCG/ACT nasal spray Place 2 sprays into both nostrils at bedtime as needed for allergies or rhinitis.   folic acid (FOLVITE) 1 MG tablet Take 1 mg by mouth daily.     gabapentin (NEURONTIN) 800 MG tablet Take 800 mg by mouth 2 (two) times daily.   isosorbide mononitrate (IMDUR) 30 MG 24 hr tablet Take 1 tablet (30 mg total) by mouth daily.   lisinopril (PRINIVIL,ZESTRIL) 40 MG tablet Take 1 tablet by mouth daily   nitroGLYCERIN (NITROSTAT) 0.4 MG SL tablet Place 1 tablet (0.4 mg total) under the  tongue every 5 (five) minutes x 3 doses as needed for chest pain.   spironolactone (ALDACTONE) 25 MG tablet Take 0.5 tablets (12.5 mg total) by mouth daily.     Allergies:   Cyclosporine and Serzone [nefazodone hcl]   Social History   Tobacco Use   Smoking status: Former Smoker    Years: 1.00   Smokeless tobacco: Never Used   Tobacco comment: 06/29/2016 "smoked for ~ 1 yr while in the service"  Substance Use Topics   Alcohol use: No   Drug use: No     Family Hx: The patient's family history includes Cancer in his father; Heart attack in his mother; Heart disease in his mother.  ROS:   Please see the history of present illness.     All other  systems reviewed and are negative.   Labs/Other Tests and Data Reviewed:    Recent Labs: 01/16/2018: Hemoglobin 13.5; NT-Pro BNP 34; Platelets 157 05/16/2018: ALT 17; BUN 8; Creatinine, Ser 0.72; Potassium 5.1; Sodium 128   Recent Lipid Panel Lab Results  Component Value Date/Time   CHOL 124 05/23/2018 08:32 AM   TRIG 93 05/23/2018 08:32 AM   HDL 53 05/23/2018 08:32 AM   CHOLHDL 2.3 05/23/2018 08:32 AM   CHOLHDL 2.6 04/02/2016 05:00 AM   LDLCALC 52 05/23/2018 08:32 AM    Wt Readings from Last 3 Encounters:  07/04/18 188 lb (85.3 kg)  05/21/18 189 lb (85.7 kg)  02/19/18 190 lb (86.2 kg)     Objective:    Vital Signs:  BP (!) 141/85    Pulse 72    Ht 5\' 5"  (1.651 m)    Wt 188 lb (85.3 kg)    BMI 31.28 kg/m    CONSTITUTIONAL:  Well nourished, well developed male in no acute distress.  EYES: anicteric MOUTH: oral mucosa is pink RESPIRATORY: Normal respiratory effort, symmetric expansion CARDIOVASCULAR: No peripheral edema SKIN: No rash, lesions or ulcers MUSCULOSKELETAL: no digital cyanosis NEURO: Cranial Nerves II-XII grossly intact, moves all extremities PSYCH: Intact judgement and insight.  A&O x 3, Mood/affect appropriate   ASSESSMENT & PLAN:    1.  OSA - the patient is tolerating PAP therapy well but has had  some issues with the mask leaking.  He is going to try the Respironics Dreamwear nasal mask and I will get a download from his DME in 4 weeks. The patient has been using and benefiting from PAP use and will continue to benefit from therapy.   2.  HTN - he checks his BP at home and it is controlled.  He will continue on amlodipine 10mg  daily, carvedilol 12.5mg  BID, Lisinopril 40mg  daily and spironolactone 12.5mg  daily.    3.  Obesity - I have encouraged him to get into a routine exercise program and cut back on carbs and portions.   4.  COVID-19 Education:The signs and symptoms of COVID-19 were discussed with the patient and how to seek care for testing (follow up with PCP or arrange E-visit).  The importance of social distancing was discussed today.  Patient Risk:   After full review of this patient's clinical status, I feel that they are at least moderate risk at this time.  Time:   Today, I have spent 25 minutes with the patient reviewing chart and discussing medical problems including OSA and HTN and reviewing symptoms of COVID 19 and the ways to protect against contracting the virus with telehealth technology.      Medication Adjustments/Labs and Tests Ordered: Current medicines are reviewed at length with the patient today.  Concerns regarding medicines are outlined above.  Tests Ordered: No orders of the defined types were placed in this encounter.  Medication Changes: No orders of the defined types were placed in this encounter.   Disposition:  Follow up in 1 year(s)  Signed, Fransico Him, MD  07/04/2018 3:29 PM    [place]

## 2018-07-04 NOTE — Telephone Encounter (Signed)
-----   Message from Aaron Margarita, MD sent at 06/29/2018  4:36 PM EDT ----- AHI is too high.  Please find out if patient is sleeping on his back and if mask is leaking.  Please document which type of mask he has.  Encourage him to be more compliant with his device

## 2018-07-04 NOTE — Telephone Encounter (Signed)
Informed patient of compliance results and verbalized understanding was indicated. Patient is aware and agreeable to AHI being elevated at 13.1. Patient is aware and agreeable to improving in compliance with machine usage. Patient states he does sleep on his back a little but mostly on his side and he states his mask does leak. Patient states he hates to wear the mask and feels like he gets less sleep with his cpap. Patient has a under the nose mask. Patient has been encouraged to be more compliant with his device.

## 2018-07-04 NOTE — Patient Instructions (Signed)

## 2018-07-09 ENCOUNTER — Ambulatory Visit (INDEPENDENT_AMBULATORY_CARE_PROVIDER_SITE_OTHER): Payer: Medicare Other | Admitting: *Deleted

## 2018-07-09 ENCOUNTER — Other Ambulatory Visit: Payer: Self-pay

## 2018-07-09 DIAGNOSIS — I495 Sick sinus syndrome: Secondary | ICD-10-CM

## 2018-07-09 LAB — CUP PACEART REMOTE DEVICE CHECK
Battery Remaining Longevity: 116 mo
Battery Remaining Percentage: 95.5 %
Battery Voltage: 3.01 V
Brady Statistic AP VP Percent: 1 %
Brady Statistic AP VS Percent: 94 %
Brady Statistic AS VP Percent: 0 %
Brady Statistic AS VS Percent: 5.8 %
Brady Statistic RA Percent Paced: 94 %
Brady Statistic RV Percent Paced: 1 %
Date Time Interrogation Session: 20200420060014
Implantable Lead Implant Date: 20180410
Implantable Lead Implant Date: 20180410
Implantable Lead Location: 753859
Implantable Lead Location: 753860
Implantable Pulse Generator Implant Date: 20180410
Lead Channel Impedance Value: 480 Ohm
Lead Channel Impedance Value: 560 Ohm
Lead Channel Pacing Threshold Amplitude: 0.75 V
Lead Channel Pacing Threshold Amplitude: 0.75 V
Lead Channel Pacing Threshold Pulse Width: 0.5 ms
Lead Channel Pacing Threshold Pulse Width: 0.5 ms
Lead Channel Sensing Intrinsic Amplitude: 1.6 mV
Lead Channel Sensing Intrinsic Amplitude: 7.6 mV
Lead Channel Setting Pacing Amplitude: 2 V
Lead Channel Setting Pacing Amplitude: 2.5 V
Lead Channel Setting Pacing Pulse Width: 0.5 ms
Lead Channel Setting Sensing Sensitivity: 2 mV
Pulse Gen Model: 2272
Pulse Gen Serial Number: 8016790

## 2018-07-10 IMAGING — US US ABDOMINAL AORTA SCREENING AAA
1 series · 14 of 20 positions shown · non-contrast
Comparison: None.

CLINICAL DATA: Male between 65-75 years of age with a smoking
history.

EXAM:
US ABDOMINAL AORTA MEDICARE SCREENING
TECHNIQUE: Ultrasound examination of the abdominal aorta was performed as a
screening evaluation for abdominal aortic aneurysm.

[Series 1: us abdominal aorta screening aaa · 0.28mm/px · 14 of 20 slices shown]
[im 1/20]
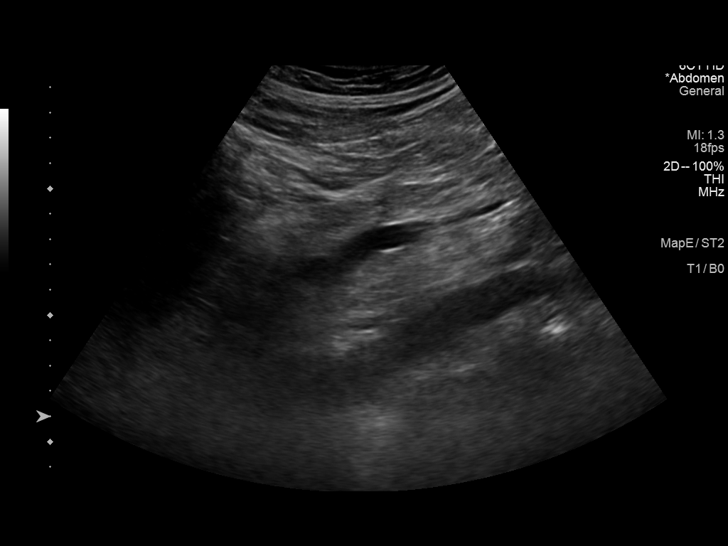
[im 3/20]
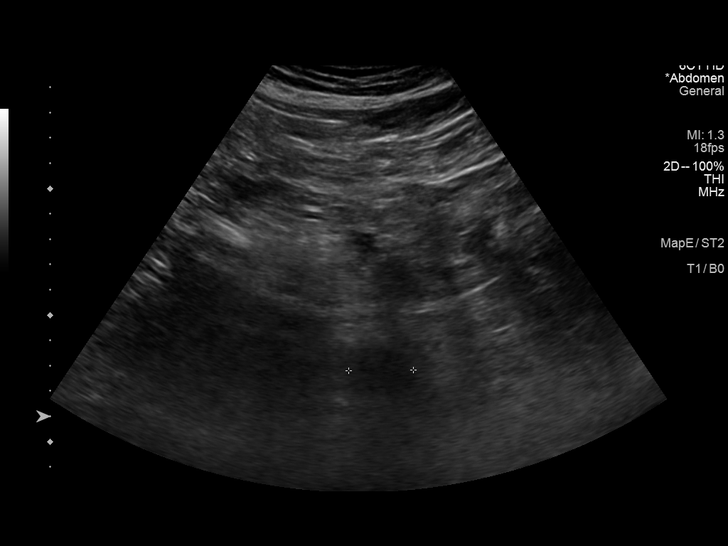
[im 4/20]
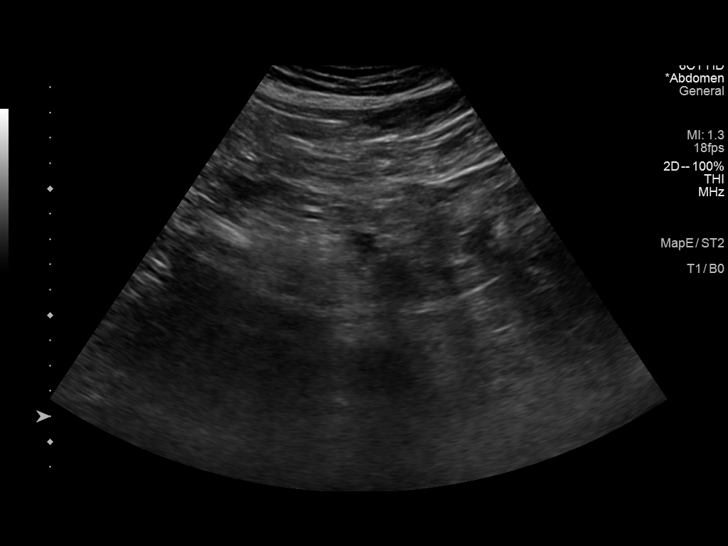
[im 6/20]
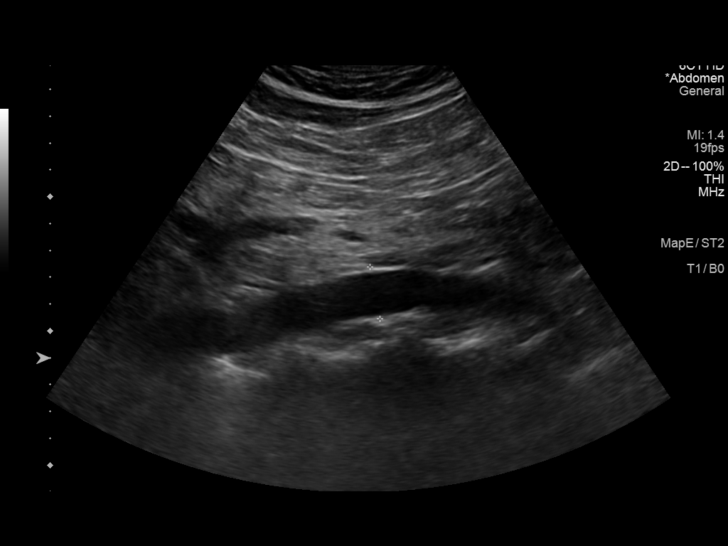
[im 7/20]
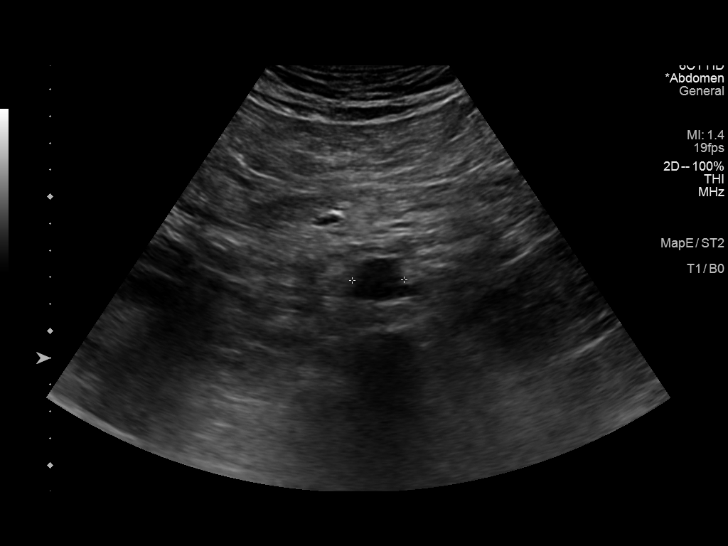
[im 8/20]
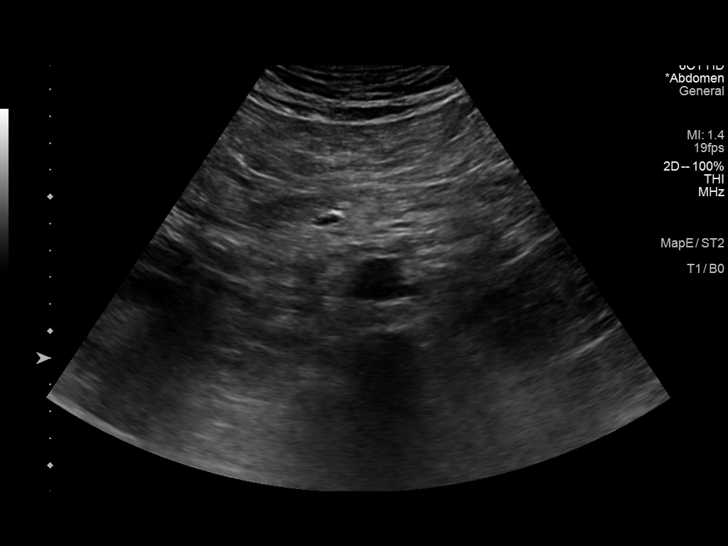
[im 10/20]
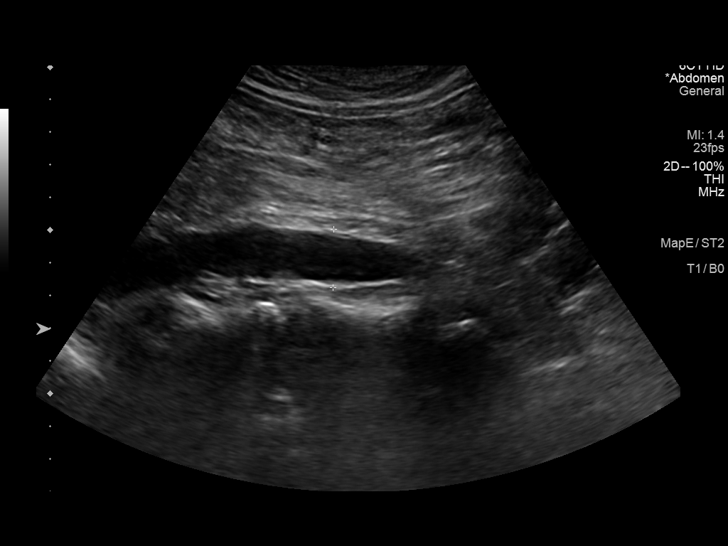
[im 11/20]
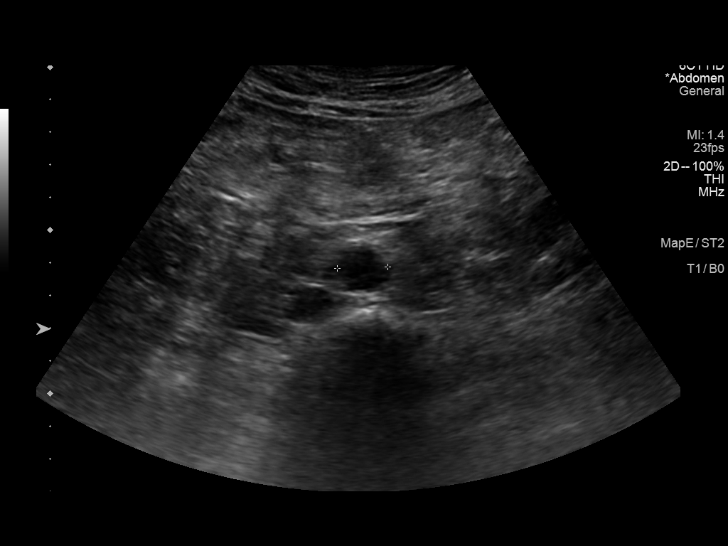
[im 13/20]
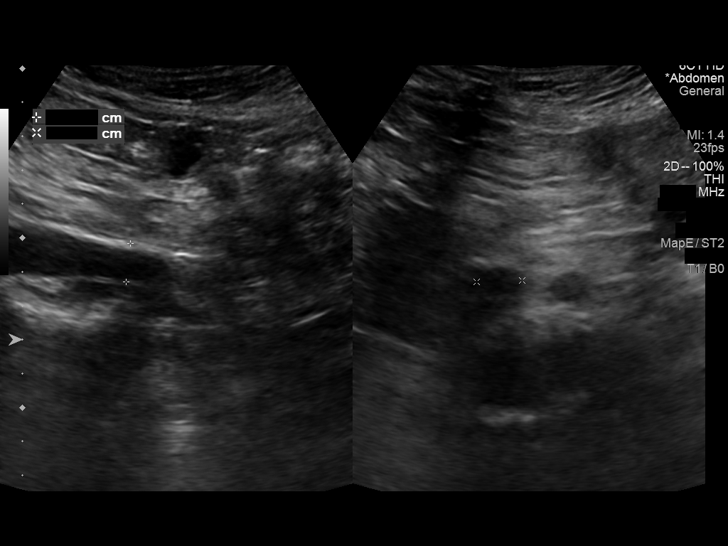
[im 14/20]
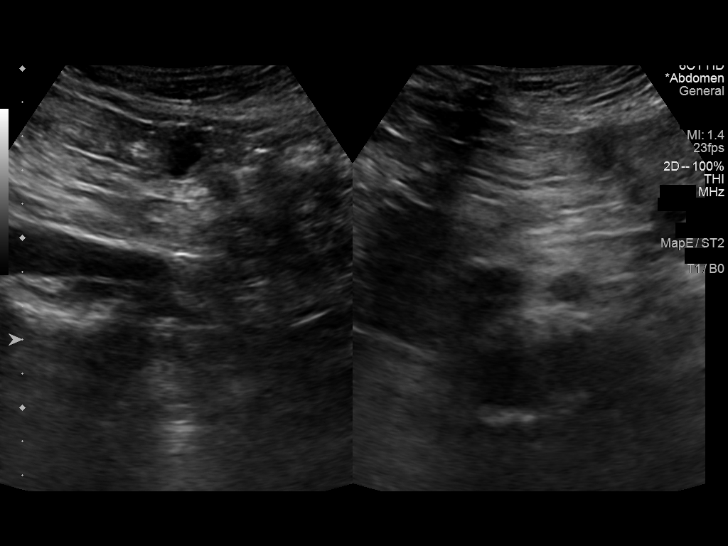
[im 16/20]
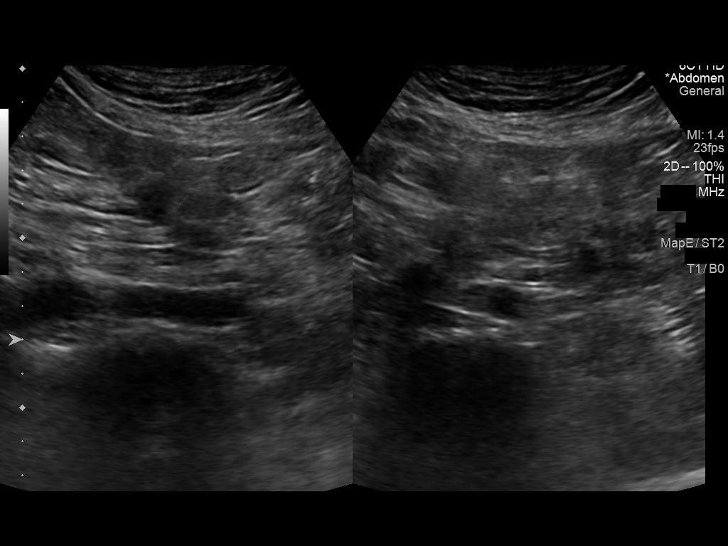
[im 17/20]
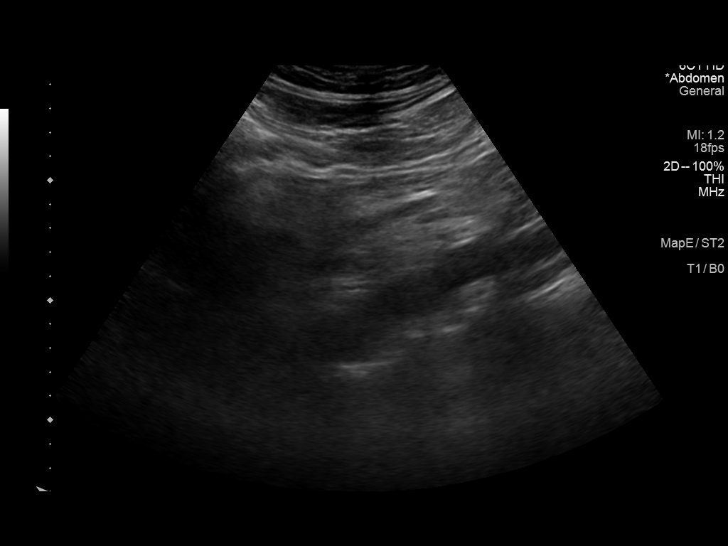
[im 18/20]
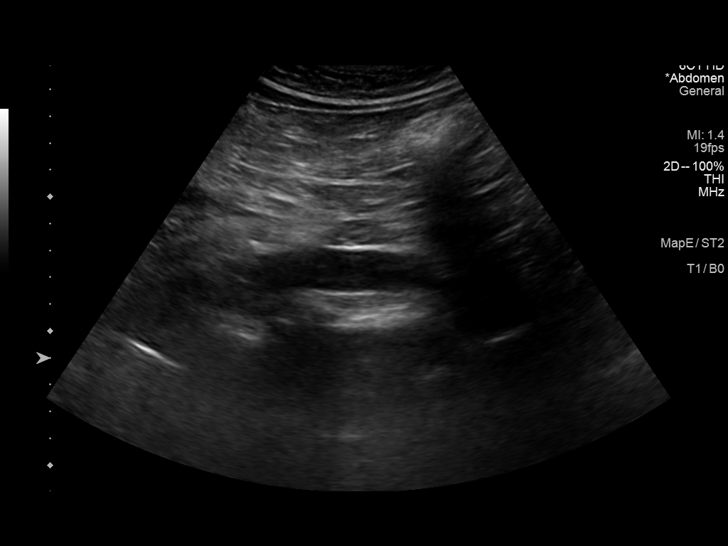
[im 20/20]
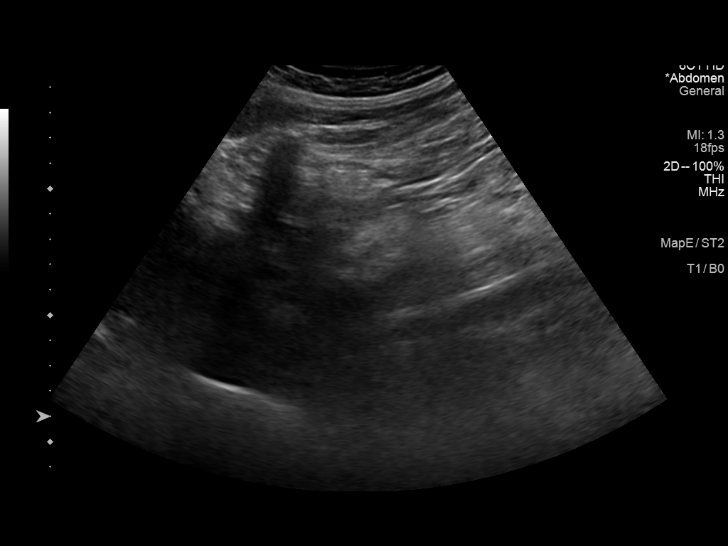

[14 of 20 positions shown; findings below may reference images not displayed]

FINDINGS: Abdominal aortic measurements as follows:

Proximal:  2.5 cm

Mid:  2.0 cm

Distal:  1.8 cm
IMPRESSION: No evidence of abdominal aortic aneurysm.

## 2018-07-17 ENCOUNTER — Encounter: Payer: Self-pay | Admitting: Cardiology

## 2018-07-17 NOTE — Progress Notes (Signed)
Remote pacemaker transmission.   

## 2018-08-24 DIAGNOSIS — Z0000 Encounter for general adult medical examination without abnormal findings: Secondary | ICD-10-CM | POA: Diagnosis not present

## 2018-08-24 DIAGNOSIS — J309 Allergic rhinitis, unspecified: Secondary | ICD-10-CM | POA: Diagnosis not present

## 2018-08-24 DIAGNOSIS — N400 Enlarged prostate without lower urinary tract symptoms: Secondary | ICD-10-CM | POA: Diagnosis not present

## 2018-08-24 DIAGNOSIS — Z91038 Other insect allergy status: Secondary | ICD-10-CM | POA: Diagnosis not present

## 2018-08-24 DIAGNOSIS — Z7189 Other specified counseling: Secondary | ICD-10-CM | POA: Diagnosis not present

## 2018-08-24 DIAGNOSIS — E782 Mixed hyperlipidemia: Secondary | ICD-10-CM | POA: Diagnosis not present

## 2018-08-24 DIAGNOSIS — G40909 Epilepsy, unspecified, not intractable, without status epilepticus: Secondary | ICD-10-CM | POA: Diagnosis not present

## 2018-08-24 DIAGNOSIS — I10 Essential (primary) hypertension: Secondary | ICD-10-CM | POA: Diagnosis not present

## 2018-08-24 DIAGNOSIS — F329 Major depressive disorder, single episode, unspecified: Secondary | ICD-10-CM | POA: Diagnosis not present

## 2018-08-24 DIAGNOSIS — Z125 Encounter for screening for malignant neoplasm of prostate: Secondary | ICD-10-CM | POA: Diagnosis not present

## 2018-08-24 DIAGNOSIS — G629 Polyneuropathy, unspecified: Secondary | ICD-10-CM | POA: Diagnosis not present

## 2018-08-24 DIAGNOSIS — M545 Low back pain: Secondary | ICD-10-CM | POA: Diagnosis not present

## 2018-08-27 DIAGNOSIS — M47816 Spondylosis without myelopathy or radiculopathy, lumbar region: Secondary | ICD-10-CM | POA: Diagnosis not present

## 2018-09-03 DIAGNOSIS — G250 Essential tremor: Secondary | ICD-10-CM | POA: Diagnosis not present

## 2018-09-03 DIAGNOSIS — G40209 Localization-related (focal) (partial) symptomatic epilepsy and epileptic syndromes with complex partial seizures, not intractable, without status epilepticus: Secondary | ICD-10-CM | POA: Diagnosis not present

## 2018-09-03 DIAGNOSIS — B004 Herpesviral encephalitis: Secondary | ICD-10-CM | POA: Diagnosis not present

## 2018-09-03 DIAGNOSIS — I6350 Cerebral infarction due to unspecified occlusion or stenosis of unspecified cerebral artery: Secondary | ICD-10-CM | POA: Diagnosis not present

## 2018-09-11 DIAGNOSIS — M47816 Spondylosis without myelopathy or radiculopathy, lumbar region: Secondary | ICD-10-CM | POA: Diagnosis not present

## 2018-10-08 ENCOUNTER — Ambulatory Visit (INDEPENDENT_AMBULATORY_CARE_PROVIDER_SITE_OTHER): Payer: Medicare Other | Admitting: *Deleted

## 2018-10-08 DIAGNOSIS — I495 Sick sinus syndrome: Secondary | ICD-10-CM | POA: Diagnosis not present

## 2018-10-08 LAB — CUP PACEART REMOTE DEVICE CHECK
Date Time Interrogation Session: 20200720163234
Implantable Lead Implant Date: 20180410
Implantable Lead Implant Date: 20180410
Implantable Lead Location: 753859
Implantable Lead Location: 753860
Implantable Pulse Generator Implant Date: 20180410
Pulse Gen Model: 2272
Pulse Gen Serial Number: 8016790

## 2018-10-23 ENCOUNTER — Encounter: Payer: Self-pay | Admitting: Cardiology

## 2018-10-23 NOTE — Progress Notes (Signed)
Remote pacemaker transmission.   

## 2018-11-01 ENCOUNTER — Telehealth: Payer: Self-pay | Admitting: *Deleted

## 2018-11-01 ENCOUNTER — Encounter: Payer: Self-pay | Admitting: Cardiology

## 2018-11-01 ENCOUNTER — Other Ambulatory Visit: Payer: Self-pay

## 2018-11-01 ENCOUNTER — Ambulatory Visit (INDEPENDENT_AMBULATORY_CARE_PROVIDER_SITE_OTHER): Payer: Medicare Other | Admitting: Cardiology

## 2018-11-01 VITALS — BP 135/78 | HR 60 | Ht 65.0 in | Wt 189.8 lb

## 2018-11-01 DIAGNOSIS — E669 Obesity, unspecified: Secondary | ICD-10-CM

## 2018-11-01 DIAGNOSIS — I25119 Atherosclerotic heart disease of native coronary artery with unspecified angina pectoris: Secondary | ICD-10-CM | POA: Diagnosis not present

## 2018-11-01 DIAGNOSIS — G4733 Obstructive sleep apnea (adult) (pediatric): Secondary | ICD-10-CM

## 2018-11-01 DIAGNOSIS — I10 Essential (primary) hypertension: Secondary | ICD-10-CM

## 2018-11-01 NOTE — Telephone Encounter (Signed)
Per Dr Radford Pax order placed for a respironics under the nose mask with a chin strap and d/l in 4 weeks and a virtual visit telephone in 4 weeks.

## 2018-11-01 NOTE — Patient Instructions (Signed)
Your physician recommends that you continue on your current medications as directed. Please refer to the Current Medication list given to you today.  Your physician recommends that you schedule a follow-up appointment in: [place]

## 2018-11-01 NOTE — Progress Notes (Signed)
Cardiology Office Note:    Date:  11/01/2018   ID:  Aaron Keller, DOB 1945/04/16, MRN [id number]  PCP:  Antony Contras, MD  Cardiologist:  Mertie Moores, MD    Referring MD: Antony Contras, MD   Chief Complaint  Patient presents with  . Sleep Apnea    History of Present Illness:    Aaron Keller is a 73 y.o. male with a hx of CAD, hypertension, sick sinus syndrome status post permanent pacemaker placement who was referred for split-night sleep study by Dr. Saunders Revel. He was having problems with snoring, poorly controlled HTNand obesity. He underwent PSG showing moderate obstructive sleep apnea with an AHI of 16.6/h. He underwent CPAP titration to 12 cm of water pressure.    He is now here to see me.  Unfortunately he did not tolerate the nasal pillow mask because he was breathing out of his mouth and so he was switched to a full face mask.  He decided to switch over to the [place] for his supplies but unfortunately they send there supplies in the mail so he never got correctly fitted for the full face mask and now it is leaking a lot and moves with sleep and he cannot wear it.  He has not been wearing it much and has so much problems keeping the mask sealed at night that he doesn't want to wear it.   Past Medical History:  Diagnosis Date  . CAD (coronary artery disease) 04/04/2016   LHC 1/18: mLAD 35, dLAD 70/90, oLPDA 50, mRCA 60, EF 55-65 // Nuc Stress 10/19:  EF 64, normal perfusion, low risk  . Echocardiogram    Echo 1/18: Moderate concentric LVH, EF 65-70, normal wall motion, grade 1 diastolic dysfunction, mild AI, trivial TR  . Encephalitis 02/18/1985  . Hepatitis ~ 1959   "don't know what kind"  . HTN (hypertension) 04/04/2016  . Hx of tonic-clonic seizures 02/18/1985 X 1   related to encephalitis  . Hypertension   . Nasal bleeding    "because of one of the RX I was on" (06/29/2016)  . Obesity (BMI 30-39.9) 06/15/2017  . OSA (obstructive sleep apnea) 06/15/2017   Moderate with  AHI 16.6/hr now on CPAP at 12cm H2O  . Presence of permanent cardiac pacemaker   . Seasonal allergies   . Sick sinus syndrome ([place])    s/p dual chamber pacemaker in 2018  . Stroke [facility])    "I've had 2 minor ones"; denies residual on 06/28/2016    Past Surgical History:  Procedure Laterality Date  . APPENDECTOMY    . CARDIAC CATHETERIZATION N/A 04/01/2016   Procedure: Left Heart Cath and Coronary Angiography;  Surgeon: Peter M Martinique, MD;  Location: [place] CV LAB;  Service: Cardiovascular;  Laterality: N/A;  . CARDIAC CATHETERIZATION N/A 04/01/2016   Procedure: Temporary Pacemaker;  Surgeon: Peter M Martinique, MD;  Location: [place] CV LAB;  Service: Cardiovascular;  Laterality: N/A;  . CATARACT EXTRACTION W/ INTRAOCULAR LENS  IMPLANT, BILATERAL Bilateral ~ 2007  . ELBOW SURGERY Right   . FRACTURE SURGERY    . INGUINAL HERNIA REPAIR Right 08/2010  . PACEMAKER IMPLANT N/A 06/28/2016   Procedure: Pacemaker Implant;  Surgeon: Thompson Grayer, MD;  Location: [place] CV LAB;  Service: Cardiovascular;  Laterality: N/A;  . PACEMAKER IMPLANT  06/28/2016   SJM Assurity MRI PPM implanted by Dr Rayann Heman for sick sinus syndrome  . WRIST FRACTURE SURGERY Left 1950s    Current Medications: Current Meds  Medication Sig  . amLODipine (NORVASC) 10 MG tablet Take 10 mg by mouth daily.  Marland Kitchen atorvastatin (LIPITOR) 10 MG tablet Take 1 tablet (10 mg total) by mouth daily.  . carbamazepine (TEGRETOL) 200 MG tablet Take 200 mg by mouth 4 (four) times daily.   . carvedilol (COREG) 12.5 MG tablet Take 1 tablet (12.5 mg total) by mouth 2 (two) times daily.  . citalopram (CELEXA) 40 MG tablet Take 40 mg by mouth daily.  . clorazepate (TRANXENE-T) 3.75 MG tablet Take 3.75 mg by mouth 2 (two) times daily.    Marland Kitchen dipyridamole-aspirin (AGGRENOX) 200-25 MG 12hr capsule Take 2 capsules by mouth at bedtime.   . fluticasone (FLONASE) 50 MCG/ACT nasal spray Place 2 sprays into both nostrils at bedtime as needed for  allergies or rhinitis.  . folic acid (FOLVITE) 1 MG tablet Take 1 mg by mouth daily.    Marland Kitchen gabapentin (NEURONTIN) 800 MG tablet Take 800 mg by mouth 2 (two) times daily.  . isosorbide mononitrate (IMDUR) 30 MG 24 hr tablet Take 1 tablet (30 mg total) by mouth daily.  Marland Kitchen lisinopril (PRINIVIL,ZESTRIL) 40 MG tablet Take 1 tablet by mouth daily  . nitroGLYCERIN (NITROSTAT) 0.4 MG SL tablet Place 1 tablet (0.4 mg total) under the tongue every 5 (five) minutes x 3 doses as needed for chest pain.  Marland Kitchen spironolactone (ALDACTONE) 25 MG tablet Take 0.5 tablets (12.5 mg total) by mouth daily.     Allergies:   Cyclosporine and Serzone [nefazodone hcl]   Social History   Socioeconomic History  . Marital status: Married    Spouse name: Not on file  . Number of children: Not on file  . Years of education: Not on file  . Highest education level: Not on file  Occupational History  . Not on file  Social Needs  . Financial resource strain: Not on file  . Food insecurity    Worry: Not on file    Inability: Not on file  . Transportation needs    Medical: Not on file    Non-medical: Not on file  Tobacco Use  . Smoking status: Former Smoker    Years: 1.00  . Smokeless tobacco: Never Used  . Tobacco comment: 06/29/2016 "smoked for ~ 1 yr while in the service"  Substance and Sexual Activity  . Alcohol use: No  . Drug use: No  . Sexual activity: Never  Lifestyle  . Physical activity    Days per week: Not on file    Minutes per session: Not on file  . Stress: Not on file  Relationships  . Social Herbalist on phone: Not on file    Gets together: Not on file    Attends religious service: Not on file    Active member of club or organization: Not on file    Attends meetings of clubs or organizations: Not on file    Relationship status: Not on file  Other Topics Concern  . Not on file  Social History Narrative  . Not on file     Family History: The patient's family history includes  Cancer in his father; Heart attack in his mother; Heart disease in his mother.  ROS:   Please see the history of present illness.    ROS  All other systems reviewed and negative.   EKGs/Labs/Other Studies Reviewed:    The following studies were reviewed today: PAP download  EKG:  EKG is not ordered today.    Recent  Labs: 01/16/2018: Hemoglobin 13.5; NT-Pro BNP 34; Platelets 157 05/16/2018: ALT 17; BUN 8; Creatinine, Ser 0.72; Potassium 5.1; Sodium 128   Recent Lipid Panel    Component Value Date/Time   CHOL 124 05/23/2018 0832   TRIG 93 05/23/2018 0832   HDL 53 05/23/2018 0832   CHOLHDL 2.3 05/23/2018 0832   CHOLHDL 2.6 04/02/2016 0500   VLDL 19 04/02/2016 0500   LDLCALC 52 05/23/2018 0832    Physical Exam:    VS:  BP 135/78 (BP Location: Left Arm, Patient Position: Sitting, Cuff Size: Normal)   Pulse 60   Ht 5\' 5"  (1.651 m)   Wt 189 lb 12.8 oz (86.1 kg)   BMI 31.58 kg/m     Wt Readings from Last 3 Encounters:  11/01/18 189 lb 12.8 oz (86.1 kg)  07/04/18 188 lb (85.3 kg)  05/21/18 189 lb (85.7 kg)     ASSESSMENT:    1. OSA (obstructive sleep apnea)   2. Essential hypertension   3. Obesity (BMI 30-39.9)    PLAN:    In order of problems listed above:  1.  OSA -The PAP download was reviewed today and showed an AHI of 27.3/hr on auto  with 20% compliance in using more than 4 hours nightly.  The patient has been using and benefiting from PAP use and will continue to benefit from therapy. Unfortunately he decided not to go with [facility] but instead go through the [place] to get supplies because it was free.  I told him that his insurance should pay for the device regardless of where he went to get supplies. I explained to him that the service with a local DME is going to be better in that he could actually go to their office to get accurately fitted with his mask.  He would like to try the nasal mask so I have recommended a Respironics under the nose nasal mask with  chin strap.  The hose comes out on top of his head so hopefully he will be able to sleep better on his side.  I will get a download in 4 weeks and see him back for telephone visit in 4 weeks.   2.  Hypertension - Bp is controlled on exam today.  He will continue on amlodipnie 10mg  daily, Carvedilol 12.5mg  BID, Lisinopril 40mg  daily and spiro 12.5mg  daily.   3.  Obesity - I have encouraged him to get into a routine exercise program and cut back on carbs and portions.    Medication Adjustments/Labs and Tests Ordered: Current medicines are reviewed at length with the patient today.  Concerns regarding medicines are outlined above.  No orders of the defined types were placed in this encounter.  No orders of the defined types were placed in this encounter.   Signed, Fransico Him, MD  11/01/2018 1:46 PM    [place]

## 2018-11-07 ENCOUNTER — Telehealth: Payer: Self-pay | Admitting: Nurse Practitioner

## 2018-11-07 NOTE — Progress Notes (Signed)
Electrophysiology Office Note Date: 11/08/2018  ID:  Aaron Keller, DOB January 27, 1946, MRN [id number]  PCP: Antony Contras, MD Primary Cardiologist: End Sleep: Radford Pax Electrophysiologist: Allred  CC: Pacemaker follow-up  Aaron Keller is a 73 y.o. male seen today for Dr Rayann Heman.  He presents today for routine electrophysiology followup.  Since last being seen in our clinic, the patient reports doing reasonably well.  He continues with chronic stable angina that is unchanged. He has memory issues and his wife is with him today.   He denies palpitations, dyspnea, PND, orthopnea, nausea, vomiting, dizziness, syncope, edema, weight gain, or early satiety.  Device History: STJ dual chamber PPM implanted 2018 for SSS   Past Medical History:  Diagnosis Date  . CAD (coronary artery disease) 04/04/2016   LHC 1/18: mLAD 35, dLAD 70/90, oLPDA 50, mRCA 60, EF 55-65 // Nuc Stress 10/19:  EF 64, normal perfusion, low risk  . Echocardiogram    Echo 1/18: Moderate concentric LVH, EF 65-70, normal wall motion, grade 1 diastolic dysfunction, mild AI, trivial TR  . Encephalitis 02/18/1985  . Hepatitis ~ 1959   "don't know what kind"  . HTN (hypertension) 04/04/2016  . Hx of tonic-clonic seizures 02/18/1985 X 1   related to encephalitis  . Hypertension   . Nasal bleeding    "because of one of the RX I was on" (06/29/2016)  . Obesity (BMI 30-39.9) 06/15/2017  . OSA (obstructive sleep apnea) 06/15/2017   Moderate with AHI 16.6/hr now on CPAP at 12cm H2O  . Presence of permanent cardiac pacemaker   . Seasonal allergies   . Sick sinus syndrome ([place])    s/p dual chamber pacemaker in 2018  . Stroke [facility])    "I've had 2 minor ones"; denies residual on 06/28/2016   Past Surgical History:  Procedure Laterality Date  . APPENDECTOMY    . CARDIAC CATHETERIZATION N/A 04/01/2016   Procedure: Left Heart Cath and Coronary Angiography;  Surgeon: Peter M Martinique, MD;  Location: [place] CV LAB;  Service:  Cardiovascular;  Laterality: N/A;  . CARDIAC CATHETERIZATION N/A 04/01/2016   Procedure: Temporary Pacemaker;  Surgeon: Peter M Martinique, MD;  Location: [place] CV LAB;  Service: Cardiovascular;  Laterality: N/A;  . CATARACT EXTRACTION W/ INTRAOCULAR LENS  IMPLANT, BILATERAL Bilateral ~ 2007  . ELBOW SURGERY Right   . FRACTURE SURGERY    . INGUINAL HERNIA REPAIR Right 08/2010  . PACEMAKER IMPLANT N/A 06/28/2016   Procedure: Pacemaker Implant;  Surgeon: Thompson Grayer, MD;  Location: [place] CV LAB;  Service: Cardiovascular;  Laterality: N/A;  . PACEMAKER IMPLANT  06/28/2016   SJM Assurity MRI PPM implanted by Dr Rayann Heman for sick sinus syndrome  . WRIST FRACTURE SURGERY Left 1950s    Current Outpatient Medications  Medication Sig Dispense Refill  . amLODipine (NORVASC) 10 MG tablet Take 10 mg by mouth daily.    Marland Kitchen atorvastatin (LIPITOR) 10 MG tablet Take 1 tablet (10 mg total) by mouth daily. 30 tablet 6  . carbamazepine (TEGRETOL) 200 MG tablet Take 200 mg by mouth 4 (four) times daily.     . carvedilol (COREG) 12.5 MG tablet Take 1 tablet (12.5 mg total) by mouth 2 (two) times daily. 180 tablet 3  . citalopram (CELEXA) 40 MG tablet Take 40 mg by mouth daily.    . clorazepate (TRANXENE-T) 3.75 MG tablet Take 3.75 mg by mouth 2 (two) times daily.      Marland Kitchen dipyridamole-aspirin (AGGRENOX) 200-25 MG 12hr capsule  Take 2 capsules by mouth at bedtime.     . fluticasone (FLONASE) 50 MCG/ACT nasal spray Place 2 sprays into both nostrils at bedtime as needed for allergies or rhinitis.    . folic acid (FOLVITE) 1 MG tablet Take 1 mg by mouth daily.      Marland Kitchen gabapentin (NEURONTIN) 800 MG tablet Take 800 mg by mouth 2 (two) times daily.    . isosorbide mononitrate (IMDUR) 30 MG 24 hr tablet Take 1 tablet (30 mg total) by mouth daily. 90 tablet 3  . lisinopril (PRINIVIL,ZESTRIL) 40 MG tablet Take 1 tablet by mouth daily 90 tablet 3  . nitroGLYCERIN (NITROSTAT) 0.4 MG SL tablet Place 1 tablet (0.4 mg total)  under the tongue every 5 (five) minutes x 3 doses as needed for chest pain. 25 tablet 4  . spironolactone (ALDACTONE) 25 MG tablet Take 0.5 tablets (12.5 mg total) by mouth daily. 90 tablet 3   No current facility-administered medications for this visit.     Allergies:   Cyclosporine and Serzone [nefazodone hcl]   Social History: Social History   Socioeconomic History  . Marital status: Married    Spouse name: Not on file  . Number of children: Not on file  . Years of education: Not on file  . Highest education level: Not on file  Occupational History  . Not on file  Social Needs  . Financial resource strain: Not on file  . Food insecurity    Worry: Not on file    Inability: Not on file  . Transportation needs    Medical: Not on file    Non-medical: Not on file  Tobacco Use  . Smoking status: Former Smoker    Years: 1.00  . Smokeless tobacco: Never Used  . Tobacco comment: 06/29/2016 "smoked for ~ 1 yr while in the service"  Substance and Sexual Activity  . Alcohol use: No  . Drug use: No  . Sexual activity: Never  Lifestyle  . Physical activity    Days per week: Not on file    Minutes per session: Not on file  . Stress: Not on file  Relationships  . Social Herbalist on phone: Not on file    Gets together: Not on file    Attends religious service: Not on file    Active member of club or organization: Not on file    Attends meetings of clubs or organizations: Not on file    Relationship status: Not on file  . Intimate partner violence    Fear of current or ex partner: Not on file    Emotionally abused: Not on file    Physically abused: Not on file    Forced sexual activity: Not on file  Other Topics Concern  . Not on file  Social History Narrative  . Not on file    Family History: Family History  Problem Relation Age of Onset  . Heart attack Mother        in her 41's  . Heart disease Mother        skipped beats  . Cancer Father        liver      Review of Systems: All other systems reviewed and are otherwise negative except as noted above.   Physical Exam: VS:  BP 132/72   Pulse 67   Ht 5\' 5"  (1.651 m)   Wt 190 lb 3.2 oz (86.3 kg)   SpO2 97%   BMI  31.65 kg/m  , BMI Body mass index is 31.65 kg/m.  GEN- The patient is well appearing, alert and oriented x 3 today.   HEENT: normocephalic, atraumatic; sclera clear, conjunctiva pink; hearing intact; oropharynx clear; neck supple  Lungs- Clear to ausculation bilaterally, normal work of breathing.  No wheezes, rales, rhonchi Heart- Regular rate and rhythm  GI- soft, non-tender, non-distended, bowel sounds present  Extremities- no clubbing, cyanosis, or edema  MS- no significant deformity or atrophy Skin- warm and dry, no rash or lesion; PPM pocket well healed Psych- euthymic mood, full affect Neuro- strength and sensation are intact  PPM Interrogation- reviewed in detail today,  See PACEART report  EKG:  EKG is not ordered today.  Recent Labs: 01/16/2018: Hemoglobin 13.5; NT-Pro BNP 34; Platelets 157 05/16/2018: ALT 17; BUN 8; Creatinine, Ser 0.72; Potassium 5.1; Sodium 128   Wt Readings from Last 3 Encounters:  11/08/18 190 lb 3.2 oz (86.3 kg)  11/01/18 189 lb 12.8 oz (86.1 kg)  07/04/18 188 lb (85.3 kg)     Other studies Reviewed: Additional studies/ records that were reviewed today include: Dr Theodosia Blender office notes   Assessment and Plan:  1.  Sick sinus syndrome Normal PPM function See Pace Art report No changes today  2.  CAD Stable angina Cath reviewed today His symptoms are largely unchanged. We discussed increasing Imdur if needed for worsening symptoms.    3.  HTN Stable No change required today  4.  OSA Followed by Dr Radford Pax   Current medicines are reviewed at length with the patient today.   The patient does not have concerns regarding his medicines.  The following changes were made today:  none  Labs/ tests ordered today include:  none No orders of the defined types were placed in this encounter.    Disposition:   Follow up with Merlin, me in 1 year    Signed, Chanetta Marshall, NP 11/08/2018 9:53 AM  [place] [address] [phone] (office) [phone] (fax)

## 2018-11-07 NOTE — Telephone Encounter (Signed)
New message   Patient states that he will be bringing his wife to the visit with Chanetta Marshall. The patient has memory problems. Please advise.

## 2018-11-07 NOTE — Telephone Encounter (Signed)
I spoke to the patient and approved his wife coming along for visit.

## 2018-11-08 ENCOUNTER — Other Ambulatory Visit: Payer: Self-pay

## 2018-11-08 ENCOUNTER — Encounter: Payer: Self-pay | Admitting: Nurse Practitioner

## 2018-11-08 ENCOUNTER — Ambulatory Visit (INDEPENDENT_AMBULATORY_CARE_PROVIDER_SITE_OTHER): Payer: Medicare Other | Admitting: Nurse Practitioner

## 2018-11-08 VITALS — BP 132/72 | HR 67 | Ht 65.0 in | Wt 190.2 lb

## 2018-11-08 DIAGNOSIS — I10 Essential (primary) hypertension: Secondary | ICD-10-CM

## 2018-11-08 DIAGNOSIS — I25119 Atherosclerotic heart disease of native coronary artery with unspecified angina pectoris: Secondary | ICD-10-CM | POA: Diagnosis not present

## 2018-11-08 DIAGNOSIS — I495 Sick sinus syndrome: Secondary | ICD-10-CM

## 2018-11-08 DIAGNOSIS — G4733 Obstructive sleep apnea (adult) (pediatric): Secondary | ICD-10-CM

## 2018-11-08 NOTE — Patient Instructions (Signed)
Medication Instructions:  none If you need a refill on your cardiac medications before your next appointment, please call your pharmacy.   Lab work: none If you have labs (blood work) drawn today and your tests are completely normal, you will receive your results only by: Marland Kitchen MyChart Message (if you have MyChart) OR . A paper copy in the mail If you have any lab test that is abnormal or we need to change your treatment, we will call you to review the results.  Testing/Procedures: none  Follow-Up: 1 year with [place], you and your health needs are our priority.  As part of our continuing mission to provide you with exceptional heart care, we have created designated Provider Care Teams.  These Care Teams include your primary Cardiologist (physician) and Advanced Practice Providers (APPs -  Physician Assistants and Nurse Practitioners) who all work together to provide you with the care you need, when you need it. .   Any Other Special Instructions Will Be Listed Below (If Applicable). Remote monitoring is used to monitor your Pacemaker  from home. This monitoring reduces the number of office visits required to check your device to one time per year. It allows [place] to keep an eye on the functioning of your device to ensure it is working properly. You are scheduled for a device check from home on 01/07/19. You may send your transmission at any time that day. If you have a wireless device, the transmission will be sent automatically. After your physician reviews your transmission, you will receive a postcard with your next transmission date.

## 2018-11-09 LAB — CUP PACEART INCLINIC DEVICE CHECK
Battery Remaining Longevity: 122 mo
Battery Voltage: 3.01 V
Brady Statistic RA Percent Paced: 94 %
Brady Statistic RV Percent Paced: 0.03 %
Date Time Interrogation Session: 20200820131132
Implantable Lead Implant Date: 20180410
Implantable Lead Implant Date: 20180410
Implantable Lead Location: 753859
Implantable Lead Location: 753860
Implantable Pulse Generator Implant Date: 20180410
Lead Channel Impedance Value: 475 Ohm
Lead Channel Impedance Value: 550 Ohm
Lead Channel Pacing Threshold Amplitude: 0.5 V
Lead Channel Pacing Threshold Amplitude: 0.5 V
Lead Channel Pacing Threshold Amplitude: 0.75 V
Lead Channel Pacing Threshold Amplitude: 0.75 V
Lead Channel Pacing Threshold Pulse Width: 0.5 ms
Lead Channel Pacing Threshold Pulse Width: 0.5 ms
Lead Channel Pacing Threshold Pulse Width: 0.5 ms
Lead Channel Pacing Threshold Pulse Width: 0.5 ms
Lead Channel Sensing Intrinsic Amplitude: 1.5 mV
Lead Channel Sensing Intrinsic Amplitude: 6.5 mV
Lead Channel Setting Pacing Amplitude: 2 V
Lead Channel Setting Pacing Amplitude: 2.5 V
Lead Channel Setting Pacing Pulse Width: 0.5 ms
Lead Channel Setting Sensing Sensitivity: 2 mV
Pulse Gen Model: 2272
Pulse Gen Serial Number: 8016790

## 2018-11-29 ENCOUNTER — Ambulatory Visit: Payer: Medicare Other | Admitting: Physician Assistant

## 2018-11-29 DIAGNOSIS — D234 Other benign neoplasm of skin of scalp and neck: Secondary | ICD-10-CM | POA: Diagnosis not present

## 2018-11-29 DIAGNOSIS — D1801 Hemangioma of skin and subcutaneous tissue: Secondary | ICD-10-CM | POA: Diagnosis not present

## 2018-11-29 DIAGNOSIS — L820 Inflamed seborrheic keratosis: Secondary | ICD-10-CM | POA: Diagnosis not present

## 2018-11-29 DIAGNOSIS — D225 Melanocytic nevi of trunk: Secondary | ICD-10-CM | POA: Diagnosis not present

## 2018-11-29 DIAGNOSIS — D485 Neoplasm of uncertain behavior of skin: Secondary | ICD-10-CM | POA: Diagnosis not present

## 2018-11-29 DIAGNOSIS — B078 Other viral warts: Secondary | ICD-10-CM | POA: Diagnosis not present

## 2018-11-29 DIAGNOSIS — L738 Other specified follicular disorders: Secondary | ICD-10-CM | POA: Diagnosis not present

## 2018-12-10 ENCOUNTER — Telehealth: Payer: Self-pay | Admitting: Internal Medicine

## 2018-12-10 NOTE — Telephone Encounter (Signed)
I will route to Gershon Cull, CMA as she most likely reached to pt in regards to upcoming appt 12/13/18 with Dr. Radford Pax.

## 2018-12-10 NOTE — Telephone Encounter (Signed)
  Wife is returning a call from [place] (?) from Friday

## 2018-12-11 ENCOUNTER — Encounter: Payer: Self-pay | Admitting: *Deleted

## 2018-12-11 NOTE — Telephone Encounter (Signed)
Patient's dme has changed to [place] [facility]-[phone] EXT [id number].

## 2018-12-11 NOTE — Telephone Encounter (Signed)
Returned call: Patient's dme has changed to [place] [phone] EXT [id number].

## 2018-12-11 NOTE — Telephone Encounter (Signed)
This encounter was created in error - please disregard.

## 2018-12-12 NOTE — Progress Notes (Signed)
Virtual Visit via telephone Note   This visit type was conducted due to national recommendations for restrictions regarding the COVID-19 Pandemic (e.g. social distancing) in an effort to limit this patient's exposure and mitigate transmission in our community.  Due to his co-morbid illnesses, this patient is at least at moderate risk for complications without adequate follow up.  This format is felt to be most appropriate for this patient at this time.  All issues noted in this document were discussed and addressed.  A limited physical exam was performed with this format.  Please refer to the patient's chart for his consent to telehealth for [facility].   Evaluation Performed:  Follow-up visit  This visit type was conducted due to national recommendations for restrictions regarding the COVID-19 Pandemic (e.g. social distancing).  This format is felt to be most appropriate for this patient at this time.  All issues noted in this document were discussed and addressed.  No physical exam was performed (except for noted visual exam findings with Video Visits).  Please refer to the patient's chart (MyChart message for video visits and phone note for telephone visits) for the patient's consent to telehealth for [facility].  Date:  12/13/2018   ID:  Aaron Keller, DOB Jul 16, 1945, MRN [id number]  Patient Location:  Home  Provider location:   [place]  PCP:  Antony Contras, MD  Cardiologist:  Mertie Moores, MD  Sleep Medicine:  Fransico Him, MD Electrophysiologist:  Thompson Grayer, MD   Chief Complaint:  OSA  History of Present Illness:    Aaron Keller is a 73 y.o. male who presents via audio/video conferencing for a telehealth visit today.    Aaron Keller is a 73 y.o. male with a hx of CAD, hypertension, sick sinus syndrome status post permanent pacemaker placement who was referred for split-night sleep study by Dr. Saunders Revel. He was having problems with snoring, poorly  controlled HTNand obesity. He underwent PSG showing moderate obstructive sleep apnea with an AHI of 16.6/h. He underwent CPAP titration to 12 cm of water pressure.   I saw him last month and he was not tolerating his mask  Unfortunately he did not tolerate the nasal pillow mask due to breathing out of his mouth.  He was changed to a full face mask and got it from the [place] due to cost instead of through the local DME and therefore was not appropriately fitted and the mask was leaking a lot and he stopped using it.  I referred him back to the local DME for his mask fitting.  He was fitted with a nasal mask with chin strap and is now back for followup.    He is doing better with the new mask but still is not sleeping well due to not liking the mask.  He goes to bed around 11pm and takes about 10-15 min to go to sleep.  He then gets up around 4am he will get up because the mask is bothering him.  He sleeps on his belly so the mask moves a lot.  He feels the pressure is adequate.  Since going on CPAP he feels rested in the am and has no significant daytime sleepiness.  He denies any significant mouth or nasal dryness or nasal congestion.  He does not think that he snores.    The patient does not have symptoms concerning for COVID-19 infection (fever, chills, cough, or new shortness of breath).   Prior CV studies:   The  following studies were reviewed today:  PAP compliance download  Past Medical History:  Diagnosis Date   CAD (coronary artery disease) 04/04/2016   LHC 1/18: mLAD 34, dLAD 70/90, oLPDA 50, mRCA 60, EF 55-65 // Nuc Stress 10/19:  EF 64, normal perfusion, low risk   Echocardiogram    Echo 1/18: Moderate concentric LVH, EF 65-70, normal wall motion, grade 1 diastolic dysfunction, mild AI, trivial TR   Encephalitis 02/18/1985   Hepatitis ~ 1959   "don't know what kind"   HTN (hypertension) 04/04/2016   Hx of tonic-clonic seizures 02/18/1985 X 1   related to encephalitis    Hypertension    Nasal bleeding    "because of one of the RX I was on" (06/29/2016)   Obesity (BMI 30-39.9) 06/15/2017   OSA (obstructive sleep apnea) 06/15/2017   Moderate with AHI 16.6/hr now on CPAP at 12cm H2O   Presence of permanent cardiac pacemaker    Seasonal allergies    Sick sinus syndrome [facility])    s/p dual chamber pacemaker in 2018   Stroke [facility])    "I've had 2 minor ones"; denies residual on 06/28/2016   Past Surgical History:  Procedure Laterality Date   APPENDECTOMY     CARDIAC CATHETERIZATION N/A 04/01/2016   Procedure: Left Heart Cath and Coronary Angiography;  Surgeon: Peter M Martinique, MD;  Location: [place] CV LAB;  Service: Cardiovascular;  Laterality: N/A;   CARDIAC CATHETERIZATION N/A 04/01/2016   Procedure: Temporary Pacemaker;  Surgeon: Peter M Martinique, MD;  Location: [place] CV LAB;  Service: Cardiovascular;  Laterality: N/A;   CATARACT EXTRACTION W/ INTRAOCULAR LENS  IMPLANT, BILATERAL Bilateral ~ 2007   ELBOW SURGERY Right    FRACTURE SURGERY     INGUINAL HERNIA REPAIR Right 08/2010   PACEMAKER IMPLANT N/A 06/28/2016   Procedure: Pacemaker Implant;  Surgeon: Thompson Grayer, MD;  Location: [place] CV LAB;  Service: Cardiovascular;  Laterality: N/A;   PACEMAKER IMPLANT  06/28/2016   SJM Assurity MRI PPM implanted by Dr Rayann Heman for sick sinus syndrome   WRIST FRACTURE SURGERY Left 1950s     Current Meds  Medication Sig   amLODipine (NORVASC) 10 MG tablet Take 10 mg by mouth daily.   atorvastatin (LIPITOR) 10 MG tablet Take 1 tablet (10 mg total) by mouth daily.   carbamazepine (TEGRETOL) 200 MG tablet Take 200 mg by mouth 4 (four) times daily.    carvedilol (COREG) 12.5 MG tablet Take 1 tablet (12.5 mg total) by mouth 2 (two) times daily.   citalopram (CELEXA) 40 MG tablet Take 40 mg by mouth daily.   clorazepate (TRANXENE-T) 3.75 MG tablet Take 3.75 mg by mouth 2 (two) times daily.     dipyridamole-aspirin (AGGRENOX) 200-25 MG 12hr  capsule Take 2 capsules by mouth at bedtime.    fluticasone (FLONASE) 50 MCG/ACT nasal spray Place 2 sprays into both nostrils at bedtime as needed for allergies or rhinitis.   folic acid (FOLVITE) 1 MG tablet Take 1 mg by mouth daily.     gabapentin (NEURONTIN) 800 MG tablet Take 800 mg by mouth 2 (two) times daily.   isosorbide mononitrate (IMDUR) 30 MG 24 hr tablet Take 1 tablet (30 mg total) by mouth daily.   lisinopril (PRINIVIL,ZESTRIL) 40 MG tablet Take 1 tablet by mouth daily   nitroGLYCERIN (NITROSTAT) 0.4 MG SL tablet Place 1 tablet (0.4 mg total) under the tongue every 5 (five) minutes x 3 doses as needed for chest pain.   spironolactone (  ALDACTONE) 25 MG tablet Take 0.5 tablets (12.5 mg total) by mouth daily.     Allergies:   Cyclosporine and Serzone [nefazodone hcl]   Social History   Tobacco Use   Smoking status: Former Smoker    Years: 1.00   Smokeless tobacco: Never Used   Tobacco comment: 06/29/2016 "smoked for ~ 1 yr while in the service"  Substance Use Topics   Alcohol use: No   Drug use: No     Family Hx: The patient's family history includes Cancer in his father; Heart attack in his mother; Heart disease in his mother.  ROS:   Please see the history of present illness.     All other systems reviewed and are negative.   Labs/Other Tests and Data Reviewed:    Recent Labs: 01/16/2018: Hemoglobin 13.5; NT-Pro BNP 34; Platelets 157 05/16/2018: ALT 17; BUN 8; Creatinine, Ser 0.72; Potassium 5.1; Sodium 128   Recent Lipid Panel Lab Results  Component Value Date/Time   CHOL 124 05/23/2018 08:32 AM   TRIG 93 05/23/2018 08:32 AM   HDL 53 05/23/2018 08:32 AM   CHOLHDL 2.3 05/23/2018 08:32 AM   CHOLHDL 2.6 04/02/2016 05:00 AM   LDLCALC 52 05/23/2018 08:32 AM    Wt Readings from Last 3 Encounters:  11/08/18 190 lb 3.2 oz (86.3 kg)  11/01/18 189 lb 12.8 oz (86.1 kg)  07/04/18 188 lb (85.3 kg)     Objective:    Vital Signs:  BP (!) 146/82     ASSESSMENT & PLAN:    1.  OSA -  The patient is tolerating PAP therapy better but still has issues sleeping with the mask. The PAP download was reviewed today and showed an AHI of 2/hr on auto PAP with 70% compliance in using more than 4 hours nightly.  The patient has been using and benefiting from PAP use and will continue to benefit from therapy. I have encouraged him to look online at the sleep pillows to see if he can find one for stomach sleepers.   2.  HTN -BP has been controlled around 115/72-146/85mmHg -continue on Lisinopril 40mg  daily, amlodipine 10mg  daily, Carvedilol 12.5mg  BID. -creatinine was stable at 0.72  3. Obesity  -I have encouraged him to get into a routine exercise program and cut back on carbs and portions.   COVID-19 Education: The signs and symptoms of COVID-19 were discussed with the patient and how to seek care for testing (follow up with PCP or arrange E-visit).  The importance of social distancing was discussed today.  Patient Risk:   After full review of this patient's clinical status, I feel that they are at least moderate risk at this time.  Time:   Today, I have spent 20 minutes directly with the patient on telemedicine discussing medical problems including OSA, HTN, obesity.  We also reviewed the symptoms of COVID 19 and the ways to protect against contracting the virus with telehealth technology.  I spent an additional 5 minutes reviewing patient's chart including PAP compliance.  Medication Adjustments/Labs and Tests Ordered: Current medicines are reviewed at length with the patient today.  Concerns regarding medicines are outlined above.  Tests Ordered: No orders of the defined types were placed in this encounter.  Medication Changes: No orders of the defined types were placed in this encounter.   Disposition:  Follow up in 1 year(s)  Signed, Fransico Him, MD  12/13/2018 10:21 AM    [facility]

## 2018-12-13 ENCOUNTER — Encounter: Payer: Self-pay | Admitting: Cardiology

## 2018-12-13 ENCOUNTER — Other Ambulatory Visit: Payer: Self-pay

## 2018-12-13 ENCOUNTER — Telehealth (INDEPENDENT_AMBULATORY_CARE_PROVIDER_SITE_OTHER): Payer: Medicare Other | Admitting: Cardiology

## 2018-12-13 VITALS — BP 146/82

## 2018-12-13 DIAGNOSIS — I25119 Atherosclerotic heart disease of native coronary artery with unspecified angina pectoris: Secondary | ICD-10-CM

## 2018-12-13 DIAGNOSIS — I10 Essential (primary) hypertension: Secondary | ICD-10-CM | POA: Diagnosis not present

## 2018-12-13 DIAGNOSIS — E669 Obesity, unspecified: Secondary | ICD-10-CM

## 2018-12-13 DIAGNOSIS — G4733 Obstructive sleep apnea (adult) (pediatric): Secondary | ICD-10-CM | POA: Diagnosis not present

## 2018-12-13 NOTE — Patient Instructions (Signed)
Medication Instructions:  Your physician recommends that you continue on your current medications as directed. Please refer to the Current Medication list given to you today.  If you need a refill on your cardiac medications before your next appointment, please call your pharmacy.   Lab work: None Ordered   Testing/Procedures: None ordered  Follow-Up: At [facility], you and your health needs are our priority.  As part of our continuing mission to provide you with exceptional heart care, we have created designated Provider Care Teams.  These Care Teams include your primary Cardiologist (physician) and Advanced Practice Providers (APPs -  Physician Assistants and Nurse Practitioners) who all work together to provide you with the care you need, when you need it. You will need a follow up appointment in 1 years.  Please call our office 2 months in advance to schedule this appointment.  You may see Dr. Fransico Him or one of the following Advanced Practice Providers on your designated Care Team:   [place], PA-C Melina Copa, PA-C . Ermalinda Barrios, PA-C  Any Other Special Instructions Will Be Listed Below (If Applicable).

## 2018-12-14 ENCOUNTER — Telehealth: Payer: Self-pay | Admitting: *Deleted

## 2018-12-14 NOTE — Telephone Encounter (Signed)
Informed patient of compliance results and patient understanding was verbalized. Patient is aware and agreeable to AHI being within range at 2.0. Patient is aware and agreeable to being in compliance with machine usage Patient is aware and agreeable to no change in current pressures.

## 2018-12-14 NOTE — Telephone Encounter (Signed)
-----   Message from Sueanne Margarita, MD sent at 12/12/2018  7:23 PM EDT ----- Good AHI and compliance.  Continue current PAP settings.

## 2018-12-21 DIAGNOSIS — J019 Acute sinusitis, unspecified: Secondary | ICD-10-CM | POA: Diagnosis not present

## 2019-01-01 DIAGNOSIS — B078 Other viral warts: Secondary | ICD-10-CM | POA: Diagnosis not present

## 2019-01-01 DIAGNOSIS — L820 Inflamed seborrheic keratosis: Secondary | ICD-10-CM | POA: Diagnosis not present

## 2019-01-02 DIAGNOSIS — Z23 Encounter for immunization: Secondary | ICD-10-CM | POA: Diagnosis not present

## 2019-01-07 ENCOUNTER — Ambulatory Visit (INDEPENDENT_AMBULATORY_CARE_PROVIDER_SITE_OTHER): Payer: Medicare Other | Admitting: *Deleted

## 2019-01-07 DIAGNOSIS — I455 Other specified heart block: Secondary | ICD-10-CM

## 2019-01-07 DIAGNOSIS — I495 Sick sinus syndrome: Secondary | ICD-10-CM | POA: Diagnosis not present

## 2019-01-07 LAB — CUP PACEART REMOTE DEVICE CHECK
Battery Remaining Longevity: 115 mo
Battery Remaining Percentage: 95.5 %
Battery Voltage: 3.01 V
Brady Statistic AP VP Percent: 1 %
Brady Statistic AP VS Percent: 97 %
Brady Statistic AS VP Percent: 1 %
Brady Statistic AS VS Percent: 2.6 %
Brady Statistic RA Percent Paced: 97 %
Brady Statistic RV Percent Paced: 1 %
Date Time Interrogation Session: 20201019060019
Implantable Lead Implant Date: 20180410
Implantable Lead Implant Date: 20180410
Implantable Lead Location: 753859
Implantable Lead Location: 753860
Implantable Pulse Generator Implant Date: 20180410
Lead Channel Impedance Value: 460 Ohm
Lead Channel Impedance Value: 550 Ohm
Lead Channel Pacing Threshold Amplitude: 0.5 V
Lead Channel Pacing Threshold Amplitude: 0.75 V
Lead Channel Pacing Threshold Pulse Width: 0.5 ms
Lead Channel Pacing Threshold Pulse Width: 0.5 ms
Lead Channel Sensing Intrinsic Amplitude: 1.9 mV
Lead Channel Sensing Intrinsic Amplitude: 8.1 mV
Lead Channel Setting Pacing Amplitude: 2 V
Lead Channel Setting Pacing Amplitude: 2.5 V
Lead Channel Setting Pacing Pulse Width: 0.5 ms
Lead Channel Setting Sensing Sensitivity: 2 mV
Pulse Gen Model: 2272
Pulse Gen Serial Number: 8016790

## 2019-01-28 NOTE — Progress Notes (Signed)
Remote pacemaker transmission.   

## 2019-02-25 DIAGNOSIS — E782 Mixed hyperlipidemia: Secondary | ICD-10-CM | POA: Diagnosis not present

## 2019-02-25 DIAGNOSIS — F329 Major depressive disorder, single episode, unspecified: Secondary | ICD-10-CM | POA: Diagnosis not present

## 2019-02-25 DIAGNOSIS — G629 Polyneuropathy, unspecified: Secondary | ICD-10-CM | POA: Diagnosis not present

## 2019-02-25 DIAGNOSIS — J309 Allergic rhinitis, unspecified: Secondary | ICD-10-CM | POA: Diagnosis not present

## 2019-02-25 DIAGNOSIS — I10 Essential (primary) hypertension: Secondary | ICD-10-CM | POA: Diagnosis not present

## 2019-02-25 DIAGNOSIS — G40909 Epilepsy, unspecified, not intractable, without status epilepticus: Secondary | ICD-10-CM | POA: Diagnosis not present

## 2019-02-25 DIAGNOSIS — R202 Paresthesia of skin: Secondary | ICD-10-CM | POA: Diagnosis not present

## 2019-02-25 DIAGNOSIS — D696 Thrombocytopenia, unspecified: Secondary | ICD-10-CM | POA: Diagnosis not present

## 2019-02-25 DIAGNOSIS — Z950 Presence of cardiac pacemaker: Secondary | ICD-10-CM | POA: Diagnosis not present

## 2019-02-25 DIAGNOSIS — F419 Anxiety disorder, unspecified: Secondary | ICD-10-CM | POA: Diagnosis not present

## 2019-02-25 DIAGNOSIS — N400 Enlarged prostate without lower urinary tract symptoms: Secondary | ICD-10-CM | POA: Diagnosis not present

## 2019-02-25 DIAGNOSIS — Z91030 Bee allergy status: Secondary | ICD-10-CM | POA: Diagnosis not present

## 2019-03-29 ENCOUNTER — Telehealth: Payer: Self-pay | Admitting: *Deleted

## 2019-03-29 NOTE — Telephone Encounter (Signed)
   [facility] Pre-operative Risk Assessment    Request for surgical clearance:  1. What type of surgery is being performed? COLONOSCOPY   2. When is this surgery scheduled? 04/04/19   3. What type of clearance is required (medical clearance vs. Pharmacy clearance to hold med vs. Both)? MEDICAL  4. Are there any medications that need to be held prior to surgery and how long? AGGRENOX X 4 DAYS PRIOR   5. Practice name and name of physician performing surgery? [facility]; DR. Christy Sartorius [facility]    6. What is your office phone number [phone]    7.   What is your office fax number [phone]  8.   Anesthesia type (None, local, MAC, general) ? NOT LISTED; PROPOFOL?    Julaine Hua 03/29/2019, 11:42 AM  _________________________________________________________________   (provider comments below)

## 2019-03-29 NOTE — Telephone Encounter (Signed)
   Primary Cardiologist: Mertie Moores, MD  Chart reviewed as part of pre-operative protocol coverage. Patient was contacted 03/29/2019 in reference to pre-operative risk assessment for pending surgery as outlined below.  Aaron Keller was last seen on 12/13/2018 by Dr. Radford Pax.  Since that day, Aaron Keller has done fine from a cardiac standpoint. He can complete 4 METs without anginal complaints.  Therefore, based on ACC/AHA guidelines, the patient would be at acceptable risk for the planned procedure without further cardiovascular testing.   Per patient, he was told he did not need to hold his Aggranox. If needed, he can hold 4 days prior to his procedure as previously requested.   I will route this recommendation to the requesting party via Epic fax function and remove from pre-op pool.  Please call with questions.  Abigail Butts, PA-C 03/29/2019, 4:26 PM

## 2019-04-02 ENCOUNTER — Telehealth: Payer: Self-pay | Admitting: Cardiology

## 2019-04-02 NOTE — Telephone Encounter (Signed)
New Message    Pts wife is calling and says the pt has stopped wearing his CPAP about 4 or 5 months ago. She is wondering if he will need to see Dr Radford Pax    Please call

## 2019-04-02 NOTE — Telephone Encounter (Signed)
He does not need to followup with me

## 2019-04-02 NOTE — Telephone Encounter (Signed)
Patient wife states that they received a letter in the mail to call and schedule a visit with Dr. Radford Pax in March 2021. She is calling to see if the patient actually needs to follow-up as he has not been wearing his CPAP machine for 5 months. She states that he is back to snoring but is sleeping much better without it. He tried different masks and nose pieces for a year and a half and got frustrated because he would end up taking it off after two hours at night. States he is still seeing Dr. Acie Fredrickson and Richardson Dopp, PA-C for cardiac problems.

## 2019-04-04 DIAGNOSIS — K640 First degree hemorrhoids: Secondary | ICD-10-CM | POA: Diagnosis not present

## 2019-04-04 DIAGNOSIS — D123 Benign neoplasm of transverse colon: Secondary | ICD-10-CM | POA: Diagnosis not present

## 2019-04-04 DIAGNOSIS — Z86010 Personal history of colonic polyps: Secondary | ICD-10-CM | POA: Diagnosis not present

## 2019-04-04 DIAGNOSIS — D120 Benign neoplasm of cecum: Secondary | ICD-10-CM | POA: Diagnosis not present

## 2019-04-04 NOTE — Telephone Encounter (Signed)
Spoke with patient's wife and let her know that he did not need to follow up with Dr. Radford Pax at this time.

## 2019-04-05 DIAGNOSIS — G40209 Localization-related (focal) (partial) symptomatic epilepsy and epileptic syndromes with complex partial seizures, not intractable, without status epilepticus: Secondary | ICD-10-CM | POA: Diagnosis not present

## 2019-04-05 DIAGNOSIS — G5603 Carpal tunnel syndrome, bilateral upper limbs: Secondary | ICD-10-CM | POA: Diagnosis not present

## 2019-04-05 DIAGNOSIS — B004 Herpesviral encephalitis: Secondary | ICD-10-CM | POA: Diagnosis not present

## 2019-04-05 DIAGNOSIS — G250 Essential tremor: Secondary | ICD-10-CM | POA: Diagnosis not present

## 2019-04-08 ENCOUNTER — Ambulatory Visit (INDEPENDENT_AMBULATORY_CARE_PROVIDER_SITE_OTHER): Payer: Medicare Other | Admitting: *Deleted

## 2019-04-08 DIAGNOSIS — I495 Sick sinus syndrome: Secondary | ICD-10-CM

## 2019-04-08 LAB — CUP PACEART REMOTE DEVICE CHECK
Battery Remaining Longevity: 114 mo
Battery Remaining Percentage: 95.5 %
Battery Voltage: 3.01 V
Brady Statistic AP VP Percent: 1 %
Brady Statistic AP VS Percent: 97 %
Brady Statistic AS VP Percent: 1 %
Brady Statistic AS VS Percent: 2.9 %
Brady Statistic RA Percent Paced: 96 %
Brady Statistic RV Percent Paced: 1 %
Date Time Interrogation Session: 20210118020013
Implantable Lead Implant Date: 20180410
Implantable Lead Implant Date: 20180410
Implantable Lead Location: 753859
Implantable Lead Location: 753860
Implantable Pulse Generator Implant Date: 20180410
Lead Channel Impedance Value: 450 Ohm
Lead Channel Impedance Value: 530 Ohm
Lead Channel Pacing Threshold Amplitude: 0.5 V
Lead Channel Pacing Threshold Amplitude: 0.75 V
Lead Channel Pacing Threshold Pulse Width: 0.5 ms
Lead Channel Pacing Threshold Pulse Width: 0.5 ms
Lead Channel Sensing Intrinsic Amplitude: 1.1 mV
Lead Channel Sensing Intrinsic Amplitude: 5.9 mV
Lead Channel Setting Pacing Amplitude: 2 V
Lead Channel Setting Pacing Amplitude: 2.5 V
Lead Channel Setting Pacing Pulse Width: 0.5 ms
Lead Channel Setting Sensing Sensitivity: 2 mV
Pulse Gen Model: 2272
Pulse Gen Serial Number: 8016790

## 2019-04-16 DIAGNOSIS — F329 Major depressive disorder, single episode, unspecified: Secondary | ICD-10-CM | POA: Diagnosis not present

## 2019-04-16 DIAGNOSIS — N400 Enlarged prostate without lower urinary tract symptoms: Secondary | ICD-10-CM | POA: Diagnosis not present

## 2019-04-16 DIAGNOSIS — I2510 Atherosclerotic heart disease of native coronary artery without angina pectoris: Secondary | ICD-10-CM | POA: Diagnosis not present

## 2019-04-16 DIAGNOSIS — E782 Mixed hyperlipidemia: Secondary | ICD-10-CM | POA: Diagnosis not present

## 2019-04-16 DIAGNOSIS — I10 Essential (primary) hypertension: Secondary | ICD-10-CM | POA: Diagnosis not present

## 2019-05-17 DIAGNOSIS — G40209 Localization-related (focal) (partial) symptomatic epilepsy and epileptic syndromes with complex partial seizures, not intractable, without status epilepticus: Secondary | ICD-10-CM | POA: Diagnosis not present

## 2019-05-17 DIAGNOSIS — B004 Herpesviral encephalitis: Secondary | ICD-10-CM | POA: Diagnosis not present

## 2019-05-17 DIAGNOSIS — G5603 Carpal tunnel syndrome, bilateral upper limbs: Secondary | ICD-10-CM | POA: Diagnosis not present

## 2019-05-17 DIAGNOSIS — G250 Essential tremor: Secondary | ICD-10-CM | POA: Diagnosis not present

## 2019-05-22 ENCOUNTER — Other Ambulatory Visit: Payer: Self-pay

## 2019-05-22 ENCOUNTER — Ambulatory Visit (INDEPENDENT_AMBULATORY_CARE_PROVIDER_SITE_OTHER): Payer: Medicare Other | Admitting: Cardiovascular Disease

## 2019-05-22 ENCOUNTER — Encounter: Payer: Self-pay | Admitting: Cardiovascular Disease

## 2019-05-22 VITALS — BP 132/76 | HR 60 | Ht 65.0 in | Wt 189.0 lb

## 2019-05-22 DIAGNOSIS — E785 Hyperlipidemia, unspecified: Secondary | ICD-10-CM | POA: Diagnosis not present

## 2019-05-22 DIAGNOSIS — E782 Mixed hyperlipidemia: Secondary | ICD-10-CM

## 2019-05-22 DIAGNOSIS — G4733 Obstructive sleep apnea (adult) (pediatric): Secondary | ICD-10-CM

## 2019-05-22 DIAGNOSIS — I2510 Atherosclerotic heart disease of native coronary artery without angina pectoris: Secondary | ICD-10-CM

## 2019-05-22 DIAGNOSIS — I25119 Atherosclerotic heart disease of native coronary artery with unspecified angina pectoris: Secondary | ICD-10-CM

## 2019-05-22 DIAGNOSIS — I495 Sick sinus syndrome: Secondary | ICD-10-CM

## 2019-05-22 LAB — BASIC METABOLIC PANEL
BUN/Creatinine Ratio: 11 (ref 10–24)
BUN: 8 mg/dL (ref 8–27)
CO2: 25 mmol/L (ref 20–29)
Calcium: 8.5 mg/dL — ABNORMAL LOW (ref 8.6–10.2)
Chloride: 94 mmol/L — ABNORMAL LOW (ref 96–106)
Creatinine, Ser: 0.75 mg/dL — ABNORMAL LOW (ref 0.76–1.27)
GFR calc Af Amer: 106 mL/min/{1.73_m2} (ref >59)
GFR calc non Af Amer: 92 mL/min/{1.73_m2} (ref >59)
Glucose: 96 mg/dL (ref 65–99)
Potassium: 5 mmol/L (ref 3.5–5.2)
Sodium: 131 mmol/L — ABNORMAL LOW (ref 134–144)

## 2019-05-22 LAB — LIPID PANEL
Chol/HDL Ratio: 2.5 ratio (ref 0.0–5.0)
Cholesterol, Total: 126 mg/dL (ref 100–199)
HDL: 51 mg/dL (ref >39)
LDL Chol Calc (NIH): 53 mg/dL (ref 0–99)
Triglycerides: 125 mg/dL (ref 0–149)
VLDL Cholesterol Cal: 22 mg/dL (ref 5–40)

## 2019-05-22 LAB — HEPATIC FUNCTION PANEL
ALT: 16 IU/L (ref 0–44)
AST: 18 IU/L (ref 0–40)
Albumin: 4.6 g/dL (ref 3.7–4.7)
Alkaline Phosphatase: 41 IU/L (ref 39–117)
Bilirubin Total: 0.4 mg/dL (ref 0.0–1.2)
Bilirubin, Direct: 0.15 mg/dL (ref 0.00–0.40)
Total Protein: 6.5 g/dL (ref 6.0–8.5)

## 2019-05-22 NOTE — Patient Instructions (Signed)
Medication Instructions:   Your physician recommends that you continue on your current medications as directed. Please refer to the Current Medication list given to you today.  *If you need a refill on your cardiac medications before your next appointment, please call your pharmacy*  Lab Work:  TODAY--BMET, LFTs, AND LIPIDS  If you have labs (blood work) drawn today and your tests are completely normal, you will receive your results only by: Marland Kitchen MyChart Message (if you have MyChart) OR . A paper copy in the mail If you have any lab test that is abnormal or we need to change your treatment, we will call you to review the results.   Follow-Up: At [facility], you and your health needs are our priority.  As part of our continuing mission to provide you with exceptional heart care, we have created designated Provider Care Teams.  These Care Teams include your primary Cardiologist (physician) and Advanced Practice Providers (APPs -  Physician Assistants and Nurse Practitioners) who all work together to provide you with the care you need, when you need it.  We recommend signing up for the patient portal called "MyChart".  Sign up information is provided on this After Visit Summary.  MyChart is used to connect with patients for Virtual Visits (Telemedicine).  Patients are able to view lab/test results, encounter notes, upcoming appointments, etc.  Non-urgent messages can be sent to your provider as well.   To learn more about what you can do with MyChart, go to [email or web address].    Your next appointment:   12 month(s)  The format for your next appointment:   In Person  Provider:   Mertie Moores, MD

## 2019-05-22 NOTE — Progress Notes (Signed)
Cardiology Office Note:    Date:  05/22/2019   ID:  Aaron Keller, DOB 1945/06/29, MRN [id number]  PCP:  Antony Contras, MD  Cardiologist:  Mertie Moores, MD   Electrophysiologist:  Thompson Grayer, MD   Referring MD: Antony Contras, MD   Chief Complaint  Patient presents with  . Coronary Artery Disease     Problem List 1. CAD - distal LAD disease 2. CVA 3. HTN 4. Hyperlipidemia  5.  Encephalitis - Herpetic encephalitis    previous notes from [place], [place]    Aaron Keller is a 74 y.o. male with coronary artery disease, sick sinus syndrome status post pacemaker, hypertension, hyperlipidemia, prior stroke, encephalitis.  Cardiac catheterization in 2018 demonstrated severe distal LAD disease and moderate nonobstructive disease elsewhere.  He has been managed medically.  He was last seen by Dr. Saunders Revel in September 2019 with complaints of chest pain.  Nuclear stress test was obtained and demonstrated normal perfusion with normal LV function.  Therefore, medical therapy was continued.     Aaron Keller returns for follow-up.  He is here with his wife.  We discussed the results of his stress test.  He is mainly troubled by shortness of breath with any type of exertion.  This has been ongoing for months.  It does not seem to be getting any worse.  He has not had paroxysmal nocturnal dyspnea or orthopnea.  He does use his CPAP at night.  However, he is only able to keep it on about 4 hours a night.  He has not had significant leg swelling.  He has not had any further chest discomfort.  He has not had any syncope.  He denies melena, hematochezia, hematuria.  He denies any coughing or wheezing.  He has a remote history of tobacco use.  February 19, 2018:  Aaron Keller is seen today for follow-up.  He is a transfer from Dr. Saunders Revel.     He was last seen by Richardson Dopp, PA.  He is not getting any regular exercise. He has lots of DOE with any exercise ( yard work )  Was started on Imdur 15 mg a day  from [place]   May 22, 2019:  Doing ok,  Seen with his wife, Aaron Keller . She needs to accompany him to each viist due to CVA and encephalopathy.  He was found to have a low vitamin D level by his [place] doctor.  He is now on vitamin D supplements.  He stopped using his CPAP because he was never able to tolerate it. No cp, no dyspnea  Has some DOE  Is not exerciising     Prior CV studies:   The following studies were reviewed today:  Nuclear stress test 12/28/17 EF 64, normal perfusion, low risk   Event monitor 04/2016  Patient was monitored for 30 days.  The predominant rhythm was normal sinus rhythm with an average rate of 65 bpm (range 38 - 104 bpm).  There daytime and nocternal episodes of sinus pauses with junctional escape beats. Longest R-R interval was 3.8 seconds.  Rare PACs were noted. There were no sustained tachyarrhythmias.  Patient triggered events correspond to sinus rhythm, sinsus rhythm with PACs, and sinus pauses. Sinus rhythm with sinus pauses and junctional escape. Longest R-R interval 3.8 seconds.  Exercise tolerance test 04/28/2016  Blood pressure demonstrated a hypertensive response to exercise.  There was no ST segment deviation noted during stress.  No T wave inversion was noted during stress.  Inconclusive study due to inadequate achieved heart rate (76% max). No ischemia for the workload achieved.  Echocardiogram 04/02/2016 Moderate concentric LVH, EF 65-70, normal wall motion, grade 1 diastolic dysfunction, mild AI, trivial TR  Cardiac catheterization 04/01/2016 LAD mid 35, distal 70/90 LPDA ostial 50 RCA mid 84 EF 55-65     Past Medical History:  Diagnosis Date  . CAD (coronary artery disease) 04/04/2016   LHC 1/18: mLAD 35, dLAD 70/90, oLPDA 50, mRCA 60, EF 55-65 // Nuc Stress 10/19:  EF 64, normal perfusion, low risk  . Echocardiogram    Echo 1/18: Moderate concentric LVH, EF 65-70, normal wall motion, grade 1 diastolic dysfunction, mild  AI, trivial TR  . Encephalitis 02/18/1985  . Hepatitis ~ 1959   "don't know what kind"  . HTN (hypertension) 04/04/2016  . Hx of tonic-clonic seizures 02/18/1985 X 1   related to encephalitis  . Hypertension   . Nasal bleeding    "because of one of the RX I was on" (06/29/2016)  . Obesity (BMI 30-39.9) 06/15/2017  . OSA (obstructive sleep apnea) 06/15/2017   Moderate with AHI 16.6/hr now on CPAP at 12cm H2O  . Presence of permanent cardiac pacemaker   . Seasonal allergies   . Sick sinus syndrome ([place])    s/p dual chamber pacemaker in 2018  . Stroke [facility])    "I've had 2 minor ones"; denies residual on 06/28/2016   Surgical Hx: The patient  has a past surgical history that includes Appendectomy; Fracture surgery; Wrist fracture surgery (Left, 1950s); Elbow surgery (Right); Inguinal hernia repair (Right, 08/2010); Cataract extraction w/ intraocular lens  implant, bilateral (Bilateral, ~ 2007); Cardiac catheterization (N/A, 04/01/2016); Cardiac catheterization (N/A, 04/01/2016); PACEMAKER IMPLANT (N/A, 06/28/2016); and PACEMAKER IMPLANT (06/28/2016).   Current Medications: Current Meds  Medication Sig  . amLODipine (NORVASC) 10 MG tablet Take 10 mg by mouth daily.  Marland Kitchen atorvastatin (LIPITOR) 10 MG tablet Take 1 tablet (10 mg total) by mouth daily.  Marland Kitchen azithromycin (ZITHROMAX) 250 MG tablet Take 250 mg by mouth as needed.  . carbamazepine (TEGRETOL) 200 MG tablet Take 200 mg by mouth 4 (four) times daily.   . carvedilol (COREG) 12.5 MG tablet Take 1 tablet (12.5 mg total) by mouth 2 (two) times daily.  . Cholecalciferol (VITAMIN D) 50 MCG (2000 UT) tablet Take 2,000 Units by mouth daily.  . citalopram (CELEXA) 40 MG tablet Take 40 mg by mouth daily.  . clorazepate (TRANXENE-T) 3.75 MG tablet Take 3.75 mg by mouth 2 (two) times daily.    Marland Kitchen dipyridamole-aspirin (AGGRENOX) 200-25 MG 12hr capsule Take 2 capsules by mouth at bedtime.   . fluticasone (FLONASE) 50 MCG/ACT nasal spray Place 2 sprays into  both nostrils at bedtime as needed for allergies or rhinitis.  . folic acid (FOLVITE) 1 MG tablet Take 1 mg by mouth daily.    Marland Kitchen gabapentin (NEURONTIN) 800 MG tablet Take 800 mg by mouth 2 (two) times daily.  . isosorbide mononitrate (IMDUR) 30 MG 24 hr tablet Take 1 tablet (30 mg total) by mouth daily.  Marland Kitchen lisinopril (PRINIVIL,ZESTRIL) 40 MG tablet Take 1 tablet by mouth daily  . nitroGLYCERIN (NITROSTAT) 0.4 MG SL tablet Place 1 tablet (0.4 mg total) under the tongue every 5 (five) minutes x 3 doses as needed for chest pain.  Marland Kitchen spironolactone (ALDACTONE) 25 MG tablet Take 0.5 tablets (12.5 mg total) by mouth daily.     Allergies:   Cyclosporine and Serzone [nefazodone hcl]   Social History  Tobacco Use  . Smoking status: Former Smoker    Years: 1.00  . Smokeless tobacco: Never Used  . 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 Alcohol use: No  . Drug use: No     Family Hx: The patient's family history includes Cancer in his father; Heart attack in his mother; Heart disease in his mother.  ROS:   Please see the history of present illness.    ROS All other systems reviewed and are negative.   Physical Exam: Blood pressure 132/76, pulse 60, height 5\' 5"  (1.651 m), weight 189 lb (85.7 kg), SpO2 98 %.  GEN:  Well nourished, well developed in no acute distress HEENT: Normal NECK: No JVD; No carotid bruits LYMPHATICS: No lymphadenopathy CARDIAC: RRR , no murmurs, rubs, gallops RESPIRATORY:  Clear to auscultation without rales, wheezing or rhonchi  ABDOMEN: Soft, non-tender, non-distended MUSCULOSKELETAL:  No edema; No deformity  SKIN: Warm and dry NEUROLOGIC:  Alert and oriented x 3   EKGs/Labs/Other Test Reviewed:    EKG:   May 22, 2019: Atrial pacing at 60.  A-R interval of 256 ms.  Recent Labs: 05/22/2019: ALT 16; BUN 8; Creatinine, Ser 0.75; Potassium 5.0; Sodium 131   Recent Lipid Panel Lab Results  Component  Value Date/Time   CHOL 126 05/22/2019 11:10 AM   TRIG 125 05/22/2019 11:10 AM   HDL 51 05/22/2019 11:10 AM   CHOLHDL 2.5 05/22/2019 11:10 AM   CHOLHDL 2.6 04/02/2016 05:00 AM   LDLCALC 53 05/22/2019 11:10 AM   From [facility] Tool: Cholesterol, total 128.000 08/18/2017 HDL 47.000 08/18/2017 LDL 58.000 08/18/2017 Triglycerides 119.000 08/18/2017 Hemoglobin 14.300 08/18/2017 Creatinine, Serum 0.670 10/04/2017 Potassium 5.000 10/04/2017 Magnesium N/D ALT (SGPT) 14.000 08/18/2017 TSH 3.510 08/18/2017 INR 0.990 04/01/2016 Platelets 124.000 06/28/2016      ASSESSMENT & PLAN:    Shortness of breath   Coronary artery disease involving native coronary artery of native heart with angina pectoris [facility]) Cardiac catheterization in January 2018 demonstrated severe distal disease in the LAD.  There was moderate nonobstructive disease elsewhere.  Recent nuclear stress test demonstrated normal perfusion and was low risk.  He still has occasional episodes of severe shortness of breath which may be his anginal equivalent.  His carvedilol dose appears to be adequate.  His resting heart rate is 61.  We will increase the isosorbide to 30 mg a day.  We can increase it further to 60 mg a day if he tolerates.  Essential hypertension Pressures well controlled.  Hyperlipidemia LDL goal <70 Most recent lipids from his primary medical doctor's office reveals a total cholesterol of 128.  His HDL is 47.  His LDL is 58.  His triglyceride level is 119.  Cardiac pacemaker in situ   Followed by EP      Medication Adjustments/Labs and Tests Ordered: Current medicines are reviewed at length with the patient today.  Concerns regarding medicines are outlined above.  Tests Ordered: Orders Placed This Encounter  Procedures  . Lipid Profile  . Basic metabolic panel  . Hepatic function panel  . EKG 12-Lead   Medication Changes: No orders of the defined types were placed in this encounter.   Signed, Mertie Moores, MD   05/22/2019 5:49 PM    [facility] [address], [place], [place]  [address] Phone: [phone]; Fax: [phone]

## 2019-06-13 DIAGNOSIS — B004 Herpesviral encephalitis: Secondary | ICD-10-CM | POA: Diagnosis not present

## 2019-06-13 DIAGNOSIS — I6350 Cerebral infarction due to unspecified occlusion or stenosis of unspecified cerebral artery: Secondary | ICD-10-CM | POA: Diagnosis not present

## 2019-06-13 DIAGNOSIS — G40209 Localization-related (focal) (partial) symptomatic epilepsy and epileptic syndromes with complex partial seizures, not intractable, without status epilepticus: Secondary | ICD-10-CM | POA: Diagnosis not present

## 2019-06-13 DIAGNOSIS — G250 Essential tremor: Secondary | ICD-10-CM | POA: Diagnosis not present

## 2019-07-01 DIAGNOSIS — G5602 Carpal tunnel syndrome, left upper limb: Secondary | ICD-10-CM | POA: Diagnosis not present

## 2019-07-01 DIAGNOSIS — G5601 Carpal tunnel syndrome, right upper limb: Secondary | ICD-10-CM | POA: Diagnosis not present

## 2019-07-08 ENCOUNTER — Ambulatory Visit (INDEPENDENT_AMBULATORY_CARE_PROVIDER_SITE_OTHER): Payer: Medicare Other | Admitting: *Deleted

## 2019-07-08 DIAGNOSIS — I495 Sick sinus syndrome: Secondary | ICD-10-CM

## 2019-07-08 LAB — CUP PACEART REMOTE DEVICE CHECK
Battery Remaining Longevity: 115 mo
Battery Remaining Percentage: 95.5 %
Battery Voltage: 3.01 V
Brady Statistic AP VP Percent: 1 %
Brady Statistic AP VS Percent: 97 %
Brady Statistic AS VP Percent: 1 %
Brady Statistic AS VS Percent: 2.8 %
Brady Statistic RA Percent Paced: 96 %
Brady Statistic RV Percent Paced: 1 %
Date Time Interrogation Session: 20210419030205
Implantable Lead Implant Date: 20180410
Implantable Lead Implant Date: 20180410
Implantable Lead Location: 753859
Implantable Lead Location: 753860
Implantable Pulse Generator Implant Date: 20180410
Lead Channel Impedance Value: 460 Ohm
Lead Channel Impedance Value: 540 Ohm
Lead Channel Pacing Threshold Amplitude: 0.5 V
Lead Channel Pacing Threshold Amplitude: 0.75 V
Lead Channel Pacing Threshold Pulse Width: 0.5 ms
Lead Channel Pacing Threshold Pulse Width: 0.5 ms
Lead Channel Sensing Intrinsic Amplitude: 1.9 mV
Lead Channel Sensing Intrinsic Amplitude: 6.3 mV
Lead Channel Setting Pacing Amplitude: 2 V
Lead Channel Setting Pacing Amplitude: 2.5 V
Lead Channel Setting Pacing Pulse Width: 0.5 ms
Lead Channel Setting Sensing Sensitivity: 2 mV
Pulse Gen Model: 2272
Pulse Gen Serial Number: 8016790

## 2019-07-09 DIAGNOSIS — I10 Essential (primary) hypertension: Secondary | ICD-10-CM | POA: Diagnosis not present

## 2019-07-09 DIAGNOSIS — N400 Enlarged prostate without lower urinary tract symptoms: Secondary | ICD-10-CM | POA: Diagnosis not present

## 2019-07-09 DIAGNOSIS — I2510 Atherosclerotic heart disease of native coronary artery without angina pectoris: Secondary | ICD-10-CM | POA: Diagnosis not present

## 2019-07-09 DIAGNOSIS — E782 Mixed hyperlipidemia: Secondary | ICD-10-CM | POA: Diagnosis not present

## 2019-07-09 DIAGNOSIS — F329 Major depressive disorder, single episode, unspecified: Secondary | ICD-10-CM | POA: Diagnosis not present

## 2019-07-09 NOTE — Progress Notes (Signed)
PPM Remote  

## 2019-07-30 DIAGNOSIS — G5601 Carpal tunnel syndrome, right upper limb: Secondary | ICD-10-CM | POA: Diagnosis not present

## 2019-08-29 DIAGNOSIS — F329 Major depressive disorder, single episode, unspecified: Secondary | ICD-10-CM | POA: Diagnosis not present

## 2019-08-29 DIAGNOSIS — I10 Essential (primary) hypertension: Secondary | ICD-10-CM | POA: Diagnosis not present

## 2019-08-29 DIAGNOSIS — J309 Allergic rhinitis, unspecified: Secondary | ICD-10-CM | POA: Diagnosis not present

## 2019-08-29 DIAGNOSIS — G40909 Epilepsy, unspecified, not intractable, without status epilepticus: Secondary | ICD-10-CM | POA: Diagnosis not present

## 2019-08-29 DIAGNOSIS — G629 Polyneuropathy, unspecified: Secondary | ICD-10-CM | POA: Diagnosis not present

## 2019-08-29 DIAGNOSIS — D696 Thrombocytopenia, unspecified: Secondary | ICD-10-CM | POA: Diagnosis not present

## 2019-08-29 DIAGNOSIS — Z1389 Encounter for screening for other disorder: Secondary | ICD-10-CM | POA: Diagnosis not present

## 2019-08-29 DIAGNOSIS — M545 Low back pain: Secondary | ICD-10-CM | POA: Diagnosis not present

## 2019-08-29 DIAGNOSIS — M1812 Unilateral primary osteoarthritis of first carpometacarpal joint, left hand: Secondary | ICD-10-CM | POA: Diagnosis not present

## 2019-08-29 DIAGNOSIS — Z0000 Encounter for general adult medical examination without abnormal findings: Secondary | ICD-10-CM | POA: Diagnosis not present

## 2019-08-29 DIAGNOSIS — E559 Vitamin D deficiency, unspecified: Secondary | ICD-10-CM | POA: Diagnosis not present

## 2019-08-29 DIAGNOSIS — E782 Mixed hyperlipidemia: Secondary | ICD-10-CM | POA: Diagnosis not present

## 2019-09-05 DIAGNOSIS — E871 Hypo-osmolality and hyponatremia: Secondary | ICD-10-CM | POA: Diagnosis not present

## 2019-09-05 DIAGNOSIS — Z0000 Encounter for general adult medical examination without abnormal findings: Secondary | ICD-10-CM | POA: Diagnosis not present

## 2019-09-09 ENCOUNTER — Telehealth: Payer: Self-pay | Admitting: Cardiovascular Disease

## 2019-09-09 DIAGNOSIS — E871 Hypo-osmolality and hyponatremia: Secondary | ICD-10-CM

## 2019-09-09 DIAGNOSIS — I25119 Atherosclerotic heart disease of native coronary artery with unspecified angina pectoris: Secondary | ICD-10-CM

## 2019-09-09 NOTE — Telephone Encounter (Signed)
Returned call to patient and his wife, Jackelyn Poling. Last Thursday sodium level was 131 but prior to that they were told it was critically low. Dr. Moreen Fowler told them to d/c spironolactone. They do not have a follow-up lab appointment scheduled. We discussed water and electrolyte consumption and were advised to limit intake of water to approximately 48-50 oz daily. I cautioned against high sugar electrolyte drinks. I scheduled pt for repeat bmet on 7/12 and advised wife of s/s of severe hyponatremia. She verbalized understanding and agreement with plan and thanked me for the call.

## 2019-09-09 NOTE — Telephone Encounter (Signed)
Pt c/o medication issue:  1. Name of Medication: Spironolactone 25mg  and Hydrochlorothiazide 12.5mg   2. How are you currently taking this medication (dosage and times per day)? n/a  3. Are you having a reaction (difficulty breathing--STAT)? no  4. What is your medication issue? Patient went to his PCP a couple weeks ago and his sodium levels were low. He was told to stop Spironolactone. Since them his sodium levels have come back up but his PCP is requesting that he stay off of Spironolactone and stop taking Hydrochlorothiazide. Since Dr. Acie Fredrickson put him on this medication, his wife wants to make sure that this is okay.

## 2019-09-30 ENCOUNTER — Other Ambulatory Visit: Payer: Self-pay

## 2019-09-30 ENCOUNTER — Other Ambulatory Visit: Payer: Medicare Other | Admitting: *Deleted

## 2019-09-30 DIAGNOSIS — E871 Hypo-osmolality and hyponatremia: Secondary | ICD-10-CM

## 2019-09-30 DIAGNOSIS — I25119 Atherosclerotic heart disease of native coronary artery with unspecified angina pectoris: Secondary | ICD-10-CM

## 2019-10-01 LAB — BASIC METABOLIC PANEL
BUN/Creatinine Ratio: 7 — ABNORMAL LOW (ref 10–24)
BUN: 6 mg/dL — ABNORMAL LOW (ref 8–27)
CO2: 26 mmol/L (ref 20–29)
Calcium: 8.5 mg/dL — ABNORMAL LOW (ref 8.6–10.2)
Chloride: 96 mmol/L (ref 96–106)
Creatinine, Ser: 0.81 mg/dL (ref 0.76–1.27)
GFR calc Af Amer: 103 mL/min/{1.73_m2} (ref >59)
GFR calc non Af Amer: 89 mL/min/{1.73_m2} (ref >59)
Glucose: 117 mg/dL — ABNORMAL HIGH (ref 65–99)
Potassium: 4.9 mmol/L (ref 3.5–5.2)
Sodium: 134 mmol/L (ref 134–144)

## 2019-10-03 DIAGNOSIS — M1812 Unilateral primary osteoarthritis of first carpometacarpal joint, left hand: Secondary | ICD-10-CM | POA: Diagnosis not present

## 2019-10-03 DIAGNOSIS — G5601 Carpal tunnel syndrome, right upper limb: Secondary | ICD-10-CM | POA: Diagnosis not present

## 2019-10-03 DIAGNOSIS — G5602 Carpal tunnel syndrome, left upper limb: Secondary | ICD-10-CM | POA: Diagnosis not present

## 2019-10-07 ENCOUNTER — Ambulatory Visit (INDEPENDENT_AMBULATORY_CARE_PROVIDER_SITE_OTHER): Payer: Medicare Other | Admitting: *Deleted

## 2019-10-07 DIAGNOSIS — M47816 Spondylosis without myelopathy or radiculopathy, lumbar region: Secondary | ICD-10-CM | POA: Diagnosis not present

## 2019-10-07 DIAGNOSIS — I495 Sick sinus syndrome: Secondary | ICD-10-CM | POA: Diagnosis not present

## 2019-10-07 LAB — CUP PACEART REMOTE DEVICE CHECK
Battery Remaining Longevity: 115 mo
Battery Remaining Percentage: 95.5 %
Battery Voltage: 3.01 V
Brady Statistic AP VP Percent: 1 %
Brady Statistic AP VS Percent: 97 %
Brady Statistic AS VP Percent: 1 %
Brady Statistic AS VS Percent: 3.2 %
Brady Statistic RA Percent Paced: 96 %
Brady Statistic RV Percent Paced: 1 %
Date Time Interrogation Session: 20210719020014
Implantable Lead Implant Date: 20180410
Implantable Lead Implant Date: 20180410
Implantable Lead Location: 753859
Implantable Lead Location: 753860
Implantable Pulse Generator Implant Date: 20180410
Lead Channel Impedance Value: 450 Ohm
Lead Channel Impedance Value: 540 Ohm
Lead Channel Pacing Threshold Amplitude: 0.5 V
Lead Channel Pacing Threshold Amplitude: 0.75 V
Lead Channel Pacing Threshold Pulse Width: 0.5 ms
Lead Channel Pacing Threshold Pulse Width: 0.5 ms
Lead Channel Sensing Intrinsic Amplitude: 2 mV
Lead Channel Sensing Intrinsic Amplitude: 6.9 mV
Lead Channel Setting Pacing Amplitude: 2 V
Lead Channel Setting Pacing Amplitude: 2.5 V
Lead Channel Setting Pacing Pulse Width: 0.5 ms
Lead Channel Setting Sensing Sensitivity: 2 mV
Pulse Gen Model: 2272
Pulse Gen Serial Number: 8016790

## 2019-10-08 DIAGNOSIS — M47816 Spondylosis without myelopathy or radiculopathy, lumbar region: Secondary | ICD-10-CM | POA: Diagnosis not present

## 2019-10-09 NOTE — Progress Notes (Signed)
Remote pacemaker transmission.   

## 2019-10-18 DIAGNOSIS — E782 Mixed hyperlipidemia: Secondary | ICD-10-CM | POA: Diagnosis not present

## 2019-10-18 DIAGNOSIS — I2510 Atherosclerotic heart disease of native coronary artery without angina pectoris: Secondary | ICD-10-CM | POA: Diagnosis not present

## 2019-10-18 DIAGNOSIS — N400 Enlarged prostate without lower urinary tract symptoms: Secondary | ICD-10-CM | POA: Diagnosis not present

## 2019-10-18 DIAGNOSIS — F329 Major depressive disorder, single episode, unspecified: Secondary | ICD-10-CM | POA: Diagnosis not present

## 2019-10-18 DIAGNOSIS — M1812 Unilateral primary osteoarthritis of first carpometacarpal joint, left hand: Secondary | ICD-10-CM | POA: Diagnosis not present

## 2019-10-18 DIAGNOSIS — I10 Essential (primary) hypertension: Secondary | ICD-10-CM | POA: Diagnosis not present

## 2019-10-28 DIAGNOSIS — M1812 Unilateral primary osteoarthritis of first carpometacarpal joint, left hand: Secondary | ICD-10-CM | POA: Diagnosis not present

## 2019-10-28 DIAGNOSIS — F329 Major depressive disorder, single episode, unspecified: Secondary | ICD-10-CM | POA: Diagnosis not present

## 2019-10-28 DIAGNOSIS — N400 Enlarged prostate without lower urinary tract symptoms: Secondary | ICD-10-CM | POA: Diagnosis not present

## 2019-10-28 DIAGNOSIS — I2510 Atherosclerotic heart disease of native coronary artery without angina pectoris: Secondary | ICD-10-CM | POA: Diagnosis not present

## 2019-10-28 DIAGNOSIS — I10 Essential (primary) hypertension: Secondary | ICD-10-CM | POA: Diagnosis not present

## 2019-10-28 DIAGNOSIS — E782 Mixed hyperlipidemia: Secondary | ICD-10-CM | POA: Diagnosis not present

## 2019-11-05 DIAGNOSIS — M1812 Unilateral primary osteoarthritis of first carpometacarpal joint, left hand: Secondary | ICD-10-CM | POA: Diagnosis not present

## 2019-11-05 DIAGNOSIS — G5602 Carpal tunnel syndrome, left upper limb: Secondary | ICD-10-CM | POA: Diagnosis not present

## 2019-11-11 DIAGNOSIS — L738 Other specified follicular disorders: Secondary | ICD-10-CM | POA: Diagnosis not present

## 2019-11-11 DIAGNOSIS — L820 Inflamed seborrheic keratosis: Secondary | ICD-10-CM | POA: Diagnosis not present

## 2019-11-11 DIAGNOSIS — D225 Melanocytic nevi of trunk: Secondary | ICD-10-CM | POA: Diagnosis not present

## 2019-11-11 DIAGNOSIS — D1801 Hemangioma of skin and subcutaneous tissue: Secondary | ICD-10-CM | POA: Diagnosis not present

## 2019-12-04 NOTE — Progress Notes (Signed)
Electrophysiology Office Note Date: 12/05/2019  ID:  Aaron Keller, DOB 08-23-1945, MRN [id number]  PCP: Antony Contras, MD Primary Cardiologist: End Electrophysiologist: [place]  CC: Pacemaker follow-up  Aaron Keller is a 74 y.o. male seen today for Dr Rayann Heman.  He presents today for routine electrophysiology followup.  Since last being seen in our clinic, the patient reports doing very well.  He denies chest pain (above baseline), palpitations, dyspnea, PND, orthopnea, nausea, vomiting, dizziness, syncope, edema, weight gain, or early satiety.  Device History: STJ dual chamber PPM implanted 2018 for SSS   Past Medical History:  Diagnosis Date  . CAD (coronary artery disease) 04/04/2016   LHC 1/18: mLAD 35, dLAD 70/90, oLPDA 50, mRCA 60, EF 55-65 // Nuc Stress 10/19:  EF 64, normal perfusion, low risk  . Echocardiogram    Echo 1/18: Moderate concentric LVH, EF 65-70, normal wall motion, grade 1 diastolic dysfunction, mild AI, trivial TR  . Encephalitis 02/18/1985  . Hepatitis ~ 1959   "don't know what kind"  . HTN (hypertension) 04/04/2016  . Hx of tonic-clonic seizures 02/18/1985 X 1   related to encephalitis  . Hypertension   . Nasal bleeding    "because of one of the RX I was on" (06/29/2016)  . Obesity (BMI 30-39.9) 06/15/2017  . OSA (obstructive sleep apnea) 06/15/2017   Moderate with AHI 16.6/hr now on CPAP at 12cm H2O  . Presence of permanent cardiac pacemaker   . Seasonal allergies   . Sick sinus syndrome ([place])    s/p dual chamber pacemaker in 2018  . Stroke [facility])    "I've had 2 minor ones"; denies residual on 06/28/2016   Past Surgical History:  Procedure Laterality Date  . APPENDECTOMY    . CARDIAC CATHETERIZATION N/A 04/01/2016   Procedure: Left Heart Cath and Coronary Angiography;  Surgeon: Peter M Martinique, MD;  Location: [facility];  Service: Cardiovascular;  Laterality: N/A;  . CARDIAC CATHETERIZATION N/A 04/01/2016   Procedure: Temporary  Pacemaker;  Surgeon: Peter M Martinique, MD;  Location: [facility];  Service: Cardiovascular;  Laterality: N/A;  . CATARACT EXTRACTION W/ INTRAOCULAR LENS  IMPLANT, BILATERAL Bilateral ~ 2007  . ELBOW SURGERY Right   . FRACTURE SURGERY    . INGUINAL HERNIA REPAIR Right 08/2010  . PACEMAKER IMPLANT N/A 06/28/2016   Procedure: Pacemaker Implant;  Surgeon: Thompson Grayer, MD;  Location: [facility];  Service: Cardiovascular;  Laterality: N/A;  . PACEMAKER IMPLANT  06/28/2016   SJM Assurity MRI PPM implanted by Dr Rayann Heman for sick sinus syndrome  . WRIST FRACTURE SURGERY Left 1950s    Current Outpatient Medications  Medication Sig Dispense Refill  . amLODipine (NORVASC) 10 MG tablet Take 10 mg by mouth daily.    Marland Kitchen atorvastatin (LIPITOR) 10 MG tablet Take 10 mg by mouth daily.    Marland Kitchen azithromycin (ZITHROMAX) 250 MG tablet Take 250 mg by mouth as needed.    . carbamazepine (TEGRETOL) 200 MG tablet Take 200 mg by mouth 4 (four) times daily.     . carvedilol (COREG) 12.5 MG tablet Take 1 tablet (12.5 mg total) by mouth 2 (two) times daily. 180 tablet 3  . Cholecalciferol (VITAMIN D) 50 MCG (2000 UT) tablet Take 2,000 Units by mouth daily.    . citalopram (CELEXA) 40 MG tablet Take 40 mg by mouth daily.    . clorazepate (TRANXENE-T) 3.75 MG tablet Take 3.75 mg by mouth 2 (two) times daily.      Marland Kitchen  dipyridamole-aspirin (AGGRENOX) 200-25 MG 12hr capsule Take 2 capsules by mouth at bedtime.     . fluticasone (FLONASE) 50 MCG/ACT nasal spray Place 2 sprays into both nostrils at bedtime as needed for allergies or rhinitis.    . folic acid (FOLVITE) 1 MG tablet Take 1 mg by mouth daily.      Marland Kitchen gabapentin (NEURONTIN) 800 MG tablet Take 800 mg by mouth 2 (two) times daily.    . isosorbide mononitrate (IMDUR) 30 MG 24 hr tablet Take 1 tablet (30 mg total) by mouth daily. 90 tablet 3  . lisinopril (PRINIVIL,ZESTRIL) 40 MG tablet Take 1 tablet by mouth daily 90 tablet 3  . nitroGLYCERIN (NITROSTAT) 0.4  MG SL tablet Place 1 tablet (0.4 mg total) under the tongue every 5 (five) minutes x 3 doses as needed for chest pain. 25 tablet 4   No current facility-administered medications for this visit.    Allergies:   Cyclosporine and Serzone [nefazodone hcl]   Social History: Social History   Socioeconomic History  . Marital status: Married    Spouse name: Not on file  . Number of children: Not on file  . Years of education: Not on file  . Highest education level: Not on file  Occupational History  . Not on file  Tobacco Use  . Smoking status: Former Smoker    Years: 1.00  . Smokeless tobacco: Never Used  . Tobacco comment: 06/29/2016 "smoked for ~ 1 yr while in the service"  Vaping Use  . Vaping Use: Never used  Substance and Sexual Activity  . Alcohol use: No  . Drug use: No  . Sexual activity: Never  Other Topics Concern  . Not on file  Social History Narrative  . Not on file   Social Determinants of Health   Financial Resource Strain:   . Difficulty of Paying Living Expenses: Not on file  Food Insecurity:   . Worried About Charity fundraiser in the Last Year: Not on file  . Ran Out of Food in the Last Year: Not on file  Transportation Needs:   . Lack of Transportation (Medical): Not on file  . Lack of Transportation (Non-Medical): Not on file  Physical Activity:   . Days of Exercise per Week: Not on file  . Minutes of Exercise per Session: Not on file  Stress:   . Feeling of Stress : Not on file  Social Connections:   . Frequency of Communication with Friends and Family: Not on file  . Frequency of Social Gatherings with Friends and Family: Not on file  . Attends Religious Services: Not on file  . Active Member of Clubs or Organizations: Not on file  . Attends Archivist Meetings: Not on file  . Marital Status: Not on file  Intimate Partner Violence:   . Fear of Current or Ex-Partner: Not on file  . Emotionally Abused: Not on file  . Physically  Abused: Not on file  . Sexually Abused: Not on file    Family History: Family History  Problem Relation Age of Onset  . Heart attack Mother        in her 11's  . Heart disease Mother        skipped beats  . Cancer Father        liver     Review of Systems: All other systems reviewed and are otherwise negative except as noted above.   Physical Exam: VS:  BP 128/68  Pulse 64   Ht 5\' 5"  (1.651 m)   Wt 187 lb (84.8 kg)   SpO2 97%   BMI 31.12 kg/m  , BMI Body mass index is 31.12 kg/m.  GEN- The patient is well appearing, alert and oriented x 3 today.   HEENT: normocephalic, atraumatic; sclera clear, conjunctiva pink; hearing intact; oropharynx clear; neck supple  Lungs- Clear to ausculation bilaterally, normal work of breathing.  No wheezes, rales, rhonchi Heart- Regular rate and rhythm, no murmurs, rubs or gallops  GI- soft, non-tender, non-distended, bowel sounds present  Extremities- no clubbing, cyanosis, or edema  MS- no significant deformity or atrophy Skin- warm and dry, no rash or lesion; PPM pocket well healed Psych- euthymic mood, full affect Neuro- strength and sensation are intact  PPM Interrogation- reviewed in detail today,  See PACEART report  EKG:  EKG is not ordered today.  Recent Labs: 05/22/2019: ALT 16 09/30/2019: BUN 6; Creatinine, Ser 0.81; Potassium 4.9; Sodium 134   Wt Readings from Last 3 Encounters:  12/05/19 187 lb (84.8 kg)  05/22/19 189 lb (85.7 kg)  11/08/18 190 lb 3.2 oz (86.3 kg)     Assessment and Plan:  1.  Symptomatic sick sinus syndrome Normal PPM function See Pace Art report No changes today  2.  CAD Stable angina Continue medical therapy Followed by Dr Acie Fredrickson  3.  HTN Stable No change required today    Current medicines are reviewed at length with the patient today.   The patient does not have concerns regarding his medicines.  The following changes were made today:  none  Labs/ tests ordered today include:  none No orders of the defined types were placed in this encounter.    Disposition:   Follow up with Delilah Shan, me or Dr Rayann Heman 1 year     Signed, Chanetta Marshall, NP 12/05/2019 11:51 AM  [address] [place] [address] [phone] (office) [phone] (fax)

## 2019-12-05 ENCOUNTER — Ambulatory Visit (INDEPENDENT_AMBULATORY_CARE_PROVIDER_SITE_OTHER): Payer: Medicare Other | Admitting: Nurse Practitioner

## 2019-12-05 ENCOUNTER — Other Ambulatory Visit: Payer: Self-pay

## 2019-12-05 VITALS — BP 128/68 | HR 64 | Ht 65.0 in | Wt 187.0 lb

## 2019-12-05 DIAGNOSIS — I495 Sick sinus syndrome: Secondary | ICD-10-CM | POA: Diagnosis not present

## 2019-12-05 DIAGNOSIS — I25119 Atherosclerotic heart disease of native coronary artery with unspecified angina pectoris: Secondary | ICD-10-CM

## 2019-12-05 DIAGNOSIS — I10 Essential (primary) hypertension: Secondary | ICD-10-CM | POA: Diagnosis not present

## 2019-12-05 NOTE — Patient Instructions (Signed)
Medication Instructions:  *If you need a refill on your cardiac medications before your next appointment, please call your pharmacy*  Follow-Up: At [facility], you and your health needs are our priority.  As part of our continuing mission to provide you with exceptional heart care, we have created designated Provider Care Teams.  These Care Teams include your primary Cardiologist (physician) and Advanced Practice Providers (APPs -  Physician Assistants and Nurse Practitioners) who all work together to provide you with the care you need, when you need it.  We recommend signing up for the patient portal called "MyChart".  Sign up information is provided on this After Visit Summary.  MyChart is used to connect with patients for Virtual Visits (Telemedicine).  Patients are able to view lab/test results, encounter notes, upcoming appointments, etc.  Non-urgent messages can be sent to your provider as well.   To learn more about what you can do with MyChart, go to [email or web address].    Your next appointment:   Your physician wants you to follow-up in: 1 YEAR with Chanetta Marshall, NP. You will receive a reminder letter in the mail two months in advance. If you don't receive a letter, please call our office to schedule the follow-up appointment.  Remote monitoring is used to monitor your Pacemaker from home. This monitoring reduces the number of office visits required to check your device to one time per year. It allows [place] to keep an eye on the functioning of your device to ensure it is working properly. You are scheduled for a device check from home on 01/06/20. You may send your transmission at any time that day. If you have a wireless device, the transmission will be sent automatically. After your physician reviews your transmission, you will receive a postcard with your next transmission date.  The format for your next appointment:   In Person with Chanetta Marshall, NP

## 2019-12-17 DIAGNOSIS — J309 Allergic rhinitis, unspecified: Secondary | ICD-10-CM | POA: Diagnosis not present

## 2019-12-17 DIAGNOSIS — J019 Acute sinusitis, unspecified: Secondary | ICD-10-CM | POA: Diagnosis not present

## 2020-01-01 DIAGNOSIS — Z23 Encounter for immunization: Secondary | ICD-10-CM | POA: Diagnosis not present

## 2020-01-06 ENCOUNTER — Ambulatory Visit (INDEPENDENT_AMBULATORY_CARE_PROVIDER_SITE_OTHER): Payer: Medicare Other

## 2020-01-06 DIAGNOSIS — I495 Sick sinus syndrome: Secondary | ICD-10-CM | POA: Diagnosis not present

## 2020-01-07 LAB — CUP PACEART REMOTE DEVICE CHECK
Battery Remaining Longevity: 124 mo
Battery Remaining Percentage: 95.5 %
Battery Voltage: 3.01 V
Brady Statistic AP VP Percent: 1 %
Brady Statistic AP VS Percent: 90 %
Brady Statistic AS VP Percent: 1 %
Brady Statistic AS VS Percent: 9.4 %
Brady Statistic RA Percent Paced: 90 %
Brady Statistic RV Percent Paced: 1 %
Date Time Interrogation Session: 20211018042137
Implantable Lead Implant Date: 20180410
Implantable Lead Implant Date: 20180410
Implantable Lead Location: 753859
Implantable Lead Location: 753860
Implantable Pulse Generator Implant Date: 20180410
Lead Channel Impedance Value: 480 Ohm
Lead Channel Impedance Value: 560 Ohm
Lead Channel Pacing Threshold Amplitude: 0.5 V
Lead Channel Pacing Threshold Amplitude: 0.75 V
Lead Channel Pacing Threshold Pulse Width: 0.5 ms
Lead Channel Pacing Threshold Pulse Width: 0.5 ms
Lead Channel Sensing Intrinsic Amplitude: 1 mV
Lead Channel Sensing Intrinsic Amplitude: 7.2 mV
Lead Channel Setting Pacing Amplitude: 1 V
Lead Channel Setting Pacing Amplitude: 1.5 V
Lead Channel Setting Pacing Pulse Width: 0.5 ms
Lead Channel Setting Sensing Sensitivity: 2 mV
Pulse Gen Model: 2272
Pulse Gen Serial Number: 8016790

## 2020-01-10 NOTE — Progress Notes (Signed)
Remote pacemaker transmission.   

## 2020-01-13 DIAGNOSIS — L820 Inflamed seborrheic keratosis: Secondary | ICD-10-CM | POA: Diagnosis not present

## 2020-01-13 DIAGNOSIS — D225 Melanocytic nevi of trunk: Secondary | ICD-10-CM | POA: Diagnosis not present

## 2020-01-13 DIAGNOSIS — L738 Other specified follicular disorders: Secondary | ICD-10-CM | POA: Diagnosis not present

## 2020-01-13 DIAGNOSIS — L814 Other melanin hyperpigmentation: Secondary | ICD-10-CM | POA: Diagnosis not present

## 2020-01-27 DIAGNOSIS — H02834 Dermatochalasis of left upper eyelid: Secondary | ICD-10-CM | POA: Diagnosis not present

## 2020-01-27 DIAGNOSIS — H02831 Dermatochalasis of right upper eyelid: Secondary | ICD-10-CM | POA: Diagnosis not present

## 2020-01-27 DIAGNOSIS — H26493 Other secondary cataract, bilateral: Secondary | ICD-10-CM | POA: Diagnosis not present

## 2020-01-27 DIAGNOSIS — Z961 Presence of intraocular lens: Secondary | ICD-10-CM | POA: Diagnosis not present

## 2020-02-19 DIAGNOSIS — Z23 Encounter for immunization: Secondary | ICD-10-CM | POA: Diagnosis not present

## 2020-02-27 DIAGNOSIS — Z961 Presence of intraocular lens: Secondary | ICD-10-CM | POA: Diagnosis not present

## 2020-02-27 DIAGNOSIS — H02834 Dermatochalasis of left upper eyelid: Secondary | ICD-10-CM | POA: Diagnosis not present

## 2020-02-27 DIAGNOSIS — H02831 Dermatochalasis of right upper eyelid: Secondary | ICD-10-CM | POA: Diagnosis not present

## 2020-03-02 DIAGNOSIS — E559 Vitamin D deficiency, unspecified: Secondary | ICD-10-CM | POA: Diagnosis not present

## 2020-03-02 DIAGNOSIS — D696 Thrombocytopenia, unspecified: Secondary | ICD-10-CM | POA: Diagnosis not present

## 2020-03-02 DIAGNOSIS — G40909 Epilepsy, unspecified, not intractable, without status epilepticus: Secondary | ICD-10-CM | POA: Diagnosis not present

## 2020-03-02 DIAGNOSIS — N400 Enlarged prostate without lower urinary tract symptoms: Secondary | ICD-10-CM | POA: Diagnosis not present

## 2020-03-02 DIAGNOSIS — E871 Hypo-osmolality and hyponatremia: Secondary | ICD-10-CM | POA: Diagnosis not present

## 2020-03-02 DIAGNOSIS — F329 Major depressive disorder, single episode, unspecified: Secondary | ICD-10-CM | POA: Diagnosis not present

## 2020-03-02 DIAGNOSIS — Z8673 Personal history of transient ischemic attack (TIA), and cerebral infarction without residual deficits: Secondary | ICD-10-CM | POA: Diagnosis not present

## 2020-03-02 DIAGNOSIS — G629 Polyneuropathy, unspecified: Secondary | ICD-10-CM | POA: Diagnosis not present

## 2020-03-02 DIAGNOSIS — E782 Mixed hyperlipidemia: Secondary | ICD-10-CM | POA: Diagnosis not present

## 2020-03-02 DIAGNOSIS — M545 Low back pain, unspecified: Secondary | ICD-10-CM | POA: Diagnosis not present

## 2020-03-02 DIAGNOSIS — I10 Essential (primary) hypertension: Secondary | ICD-10-CM | POA: Diagnosis not present

## 2020-04-06 ENCOUNTER — Ambulatory Visit (INDEPENDENT_AMBULATORY_CARE_PROVIDER_SITE_OTHER): Payer: Medicare Other

## 2020-04-06 DIAGNOSIS — I495 Sick sinus syndrome: Secondary | ICD-10-CM | POA: Diagnosis not present

## 2020-04-09 ENCOUNTER — Telehealth: Payer: Self-pay

## 2020-04-09 LAB — CUP PACEART REMOTE DEVICE CHECK
Battery Remaining Longevity: 92 mo
Battery Remaining Percentage: 95.5 %
Battery Voltage: 2.99 V
Brady Statistic AP VP Percent: 1 %
Brady Statistic AP VS Percent: 94 %
Brady Statistic AS VP Percent: 1 %
Brady Statistic AS VS Percent: 5.4 %
Brady Statistic RA Percent Paced: 94 %
Brady Statistic RV Percent Paced: 1 %
Date Time Interrogation Session: 20220117020015
Implantable Lead Implant Date: 20180410
Implantable Lead Implant Date: 20180410
Implantable Lead Location: 753859
Implantable Lead Location: 753860
Implantable Pulse Generator Implant Date: 20180410
Lead Channel Impedance Value: 450 Ohm
Lead Channel Impedance Value: 530 Ohm
Lead Channel Pacing Threshold Amplitude: 0.5 V
Lead Channel Pacing Threshold Amplitude: 0.625 V
Lead Channel Pacing Threshold Pulse Width: 0.5 ms
Lead Channel Pacing Threshold Pulse Width: 0.5 ms
Lead Channel Sensing Intrinsic Amplitude: 0.6 mV
Lead Channel Sensing Intrinsic Amplitude: 6.8 mV
Lead Channel Setting Pacing Amplitude: 0.875
Lead Channel Setting Pacing Amplitude: 5 V
Lead Channel Setting Pacing Pulse Width: 0.5 ms
Lead Channel Setting Sensing Sensitivity: 2 mV
Pulse Gen Model: 2272
Pulse Gen Serial Number: 8016790

## 2020-04-09 NOTE — Telephone Encounter (Signed)
Scheduled remote received 04/06/20. RA auto capture w/ frequent out of range readings and output at 5V. Called patient to advise the need for manual testing. Verbalized understanding. Apt. Made 04/16/20 @ 8:00 AM. Advised to call with further questions or concerns.

## 2020-04-16 ENCOUNTER — Ambulatory Visit (INDEPENDENT_AMBULATORY_CARE_PROVIDER_SITE_OTHER): Payer: Medicare Other | Admitting: Emergency Medicine

## 2020-04-16 ENCOUNTER — Other Ambulatory Visit: Payer: Self-pay

## 2020-04-16 DIAGNOSIS — I495 Sick sinus syndrome: Secondary | ICD-10-CM

## 2020-04-16 LAB — CUP PACEART INCLINIC DEVICE CHECK
Battery Remaining Longevity: 105 mo
Battery Voltage: 2.99 V
Brady Statistic RA Percent Paced: 94 %
Brady Statistic RV Percent Paced: 0.33 %
Date Time Interrogation Session: 20220127083822
Implantable Lead Implant Date: 20180410
Implantable Lead Implant Date: 20180410
Implantable Lead Location: 753859
Implantable Lead Location: 753860
Implantable Pulse Generator Implant Date: 20180410
Lead Channel Impedance Value: 450 Ohm
Lead Channel Impedance Value: 525 Ohm
Lead Channel Pacing Threshold Amplitude: 0.5 V
Lead Channel Pacing Threshold Amplitude: 0.5 V
Lead Channel Pacing Threshold Amplitude: 0.75 V
Lead Channel Pacing Threshold Pulse Width: 0.5 ms
Lead Channel Pacing Threshold Pulse Width: 0.5 ms
Lead Channel Pacing Threshold Pulse Width: 0.5 ms
Lead Channel Sensing Intrinsic Amplitude: 1.1 mV
Lead Channel Sensing Intrinsic Amplitude: 6.6 mV
Lead Channel Setting Pacing Amplitude: 0.875
Lead Channel Setting Pacing Amplitude: 2 V
Lead Channel Setting Pacing Pulse Width: 0.5 ms
Lead Channel Setting Sensing Sensitivity: 2 mV
Pulse Gen Model: 2272
Pulse Gen Serial Number: 8016790

## 2020-04-16 NOTE — Progress Notes (Signed)
Pacemaker check in clinic due to alert for high Atrial thresholds on autocapture.  Normal device function. Thresholds, sensing, impedances consistent with previous in-clinic measurements. Results and changes discussed with industry rep.  Noted Atrial sensing ranges from 0.6-1.38mv, atrial sensitivity previously programmed 0.63mv, which does not satisfy 2:1 safety margin.  Auto sensitivity feature programmed on today.  Atrial threshold auto capture turned off and tested manually with results of 0.5V @ 0.28ms which is consistent with previous in-clinic testing.  Out fixed at 2.0v@ 0.14ms.  Device programmed to maximize longevity. No mode switch or high ventricular rates noted. Device now programmed at appropriate safety margins. Histogram distribution appropriate for patient activity level. Device programmed to optimize intrinsic conduction. Estimated longevity 8.8 years. Patient enrolled in remote follow-up, next scheduled check 07/06/20. Patient education completed.

## 2020-04-20 NOTE — Progress Notes (Signed)
Remote pacemaker transmission.   

## 2020-04-21 DIAGNOSIS — I10 Essential (primary) hypertension: Secondary | ICD-10-CM | POA: Diagnosis not present

## 2020-04-21 DIAGNOSIS — H02834 Dermatochalasis of left upper eyelid: Secondary | ICD-10-CM | POA: Diagnosis not present

## 2020-04-21 DIAGNOSIS — I495 Sick sinus syndrome: Secondary | ICD-10-CM | POA: Diagnosis not present

## 2020-04-21 DIAGNOSIS — H02831 Dermatochalasis of right upper eyelid: Secondary | ICD-10-CM | POA: Diagnosis not present

## 2020-04-21 DIAGNOSIS — Z01810 Encounter for preprocedural cardiovascular examination: Secondary | ICD-10-CM | POA: Diagnosis not present

## 2020-04-22 DIAGNOSIS — I10 Essential (primary) hypertension: Secondary | ICD-10-CM | POA: Diagnosis not present

## 2020-04-22 DIAGNOSIS — M1812 Unilateral primary osteoarthritis of first carpometacarpal joint, left hand: Secondary | ICD-10-CM | POA: Diagnosis not present

## 2020-04-22 DIAGNOSIS — F329 Major depressive disorder, single episode, unspecified: Secondary | ICD-10-CM | POA: Diagnosis not present

## 2020-04-22 DIAGNOSIS — I2510 Atherosclerotic heart disease of native coronary artery without angina pectoris: Secondary | ICD-10-CM | POA: Diagnosis not present

## 2020-04-22 DIAGNOSIS — I4589 Other specified conduction disorders: Secondary | ICD-10-CM | POA: Diagnosis not present

## 2020-04-22 DIAGNOSIS — N400 Enlarged prostate without lower urinary tract symptoms: Secondary | ICD-10-CM | POA: Diagnosis not present

## 2020-04-22 DIAGNOSIS — E782 Mixed hyperlipidemia: Secondary | ICD-10-CM | POA: Diagnosis not present

## 2020-04-29 DIAGNOSIS — G4733 Obstructive sleep apnea (adult) (pediatric): Secondary | ICD-10-CM | POA: Diagnosis not present

## 2020-04-29 DIAGNOSIS — I10 Essential (primary) hypertension: Secondary | ICD-10-CM | POA: Diagnosis not present

## 2020-04-29 DIAGNOSIS — Z950 Presence of cardiac pacemaker: Secondary | ICD-10-CM | POA: Diagnosis not present

## 2020-04-29 DIAGNOSIS — H02831 Dermatochalasis of right upper eyelid: Secondary | ICD-10-CM | POA: Diagnosis not present

## 2020-04-29 DIAGNOSIS — H02834 Dermatochalasis of left upper eyelid: Secondary | ICD-10-CM | POA: Diagnosis not present

## 2020-04-29 DIAGNOSIS — R569 Unspecified convulsions: Secondary | ICD-10-CM | POA: Diagnosis not present

## 2020-04-29 DIAGNOSIS — I25110 Atherosclerotic heart disease of native coronary artery with unstable angina pectoris: Secondary | ICD-10-CM | POA: Diagnosis not present

## 2020-04-29 DIAGNOSIS — Z8673 Personal history of transient ischemic attack (TIA), and cerebral infarction without residual deficits: Secondary | ICD-10-CM | POA: Diagnosis not present

## 2020-04-29 DIAGNOSIS — H5340 Unspecified visual field defects: Secondary | ICD-10-CM | POA: Diagnosis not present

## 2020-05-05 DIAGNOSIS — Z48810 Encounter for surgical aftercare following surgery on the sense organs: Secondary | ICD-10-CM | POA: Diagnosis not present

## 2020-05-14 DIAGNOSIS — I6350 Cerebral infarction due to unspecified occlusion or stenosis of unspecified cerebral artery: Secondary | ICD-10-CM | POA: Diagnosis not present

## 2020-05-14 DIAGNOSIS — G40209 Localization-related (focal) (partial) symptomatic epilepsy and epileptic syndromes with complex partial seizures, not intractable, without status epilepticus: Secondary | ICD-10-CM | POA: Diagnosis not present

## 2020-05-14 DIAGNOSIS — G5603 Carpal tunnel syndrome, bilateral upper limbs: Secondary | ICD-10-CM | POA: Diagnosis not present

## 2020-05-14 DIAGNOSIS — B004 Herpesviral encephalitis: Secondary | ICD-10-CM | POA: Diagnosis not present

## 2020-05-17 ENCOUNTER — Encounter: Payer: Self-pay | Admitting: Cardiovascular Disease

## 2020-05-17 NOTE — Progress Notes (Signed)
Cardiology Office Note:    Date:  05/18/2020   ID:  Aaron Keller, DOB August 25, 1945, MRN [id number]  PCP:  Antony Contras, MD  Cardiologist:  Mertie Moores, MD   Electrophysiologist:  Thompson Grayer, MD   Referring MD: Antony Contras, MD   Chief Complaint  Patient presents with  . Coronary Artery Disease     Problem List 1. CAD - distal LAD disease 2. CVA 3. HTN 4. Hyperlipidemia  5.  Encephalitis - Herpetic encephalitis    previous notes from [place], [place]    Aaron Keller is a 75 y.o. male with coronary artery disease, sick sinus syndrome status post pacemaker, hypertension, hyperlipidemia, prior stroke, encephalitis.  Cardiac catheterization in 2018 demonstrated severe distal LAD disease and moderate nonobstructive disease elsewhere.  He has been managed medically.  He was last seen by Dr. Saunders Revel in September 2019 with complaints of chest pain.  Nuclear stress test was obtained and demonstrated normal perfusion with normal LV function.  Therefore, medical therapy was continued.     [name] returns for follow-up.  He is here with his wife.  We discussed the results of his stress test.  He is mainly troubled by shortness of breath with any type of exertion.  This has been ongoing for months.  It does not seem to be getting any worse.  He has not had paroxysmal nocturnal dyspnea or orthopnea.  He does use his CPAP at night.  However, he is only able to keep it on about 4 hours a night.  He has not had significant leg swelling.  He has not had any further chest discomfort.  He has not had any syncope.  He denies melena, hematochezia, hematuria.  He denies any coughing or wheezing.  He has a remote history of tobacco use.  February 19, 2018:  Mr. Aaron Keller is seen today for follow-up.  He is a transfer from Dr. Saunders Revel.     He was last seen by Richardson Dopp, PA.  He is not getting any regular exercise. He has lots of DOE with any exercise ( yard work )  Was started on Imdur 15 mg a day  from [place]   May 22, 2019:  Doing ok,  Seen with his wife, Aaron Keller . She needs to accompany him to each viist due to CVA and encephalopathy.  He was found to have a low vitamin D level by his [place] doctor.  He is now on vitamin D supplements.  He stopped using his CPAP because he was never able to tolerate it. No cp, no dyspnea  Has some DOE  Is not exerciising   Feb. 28, 2022;  Aaron Keller is seen today for follow up of his CAD, CVA and subsequent encephaopathy Is seen with his wife, Aaron Keller from Dec. 2021 look good  Chol = 124 HDL = 44 //LDL = 56  Prior CV studies:   The following studies were reviewed today:  Nuclear stress test 12/28/17 EF 64, normal perfusion, low risk   Event monitor 04/2016  Patient was monitored for 30 days.  The predominant rhythm was normal sinus rhythm with an average rate of 65 bpm (range 38 - 104 bpm).  There daytime and nocternal episodes of sinus pauses with junctional escape beats. Longest R-R interval was 3.8 seconds.  Rare PACs were noted. There were no sustained tachyarrhythmias.  Patient triggered events correspond to sinus rhythm, sinsus rhythm with PACs, and sinus pauses. Sinus rhythm with sinus pauses and junctional  escape. Longest R-R interval 3.8 seconds.  Exercise tolerance test 04/28/2016  Blood pressure demonstrated a hypertensive response to exercise.  There was no ST segment deviation noted during stress.  No T wave inversion was noted during stress. Inconclusive study due to inadequate achieved heart rate (76% max). No ischemia for the workload achieved.  Echocardiogram 04/02/2016 Moderate concentric LVH, EF 65-70, normal wall motion, grade 1 diastolic dysfunction, mild AI, trivial TR  Cardiac catheterization 04/01/2016 LAD mid 35, distal 70/90 LPDA ostial 50 RCA mid 17 EF 55-65     Past Medical History:  Diagnosis Date  . CAD (coronary artery disease) 04/04/2016   LHC 1/18: mLAD 35, dLAD 70/90, oLPDA 50, mRCA  60, EF 55-65 // Nuc Stress 10/19:  EF 64, normal perfusion, low risk  . Echocardiogram    Echo 1/18: Moderate concentric LVH, EF 65-70, normal wall motion, grade 1 diastolic dysfunction, mild AI, trivial TR  . Encephalitis 02/18/1985  . Hepatitis ~ 1959   "don't know what kind"  . HTN (hypertension) 04/04/2016  . Hx of tonic-clonic seizures 02/18/1985 X 1   related to encephalitis  . Hypertension   . Nasal bleeding    "because of one of the RX I was on" (06/29/2016)  . Obesity (BMI 30-39.9) 06/15/2017  . OSA (obstructive sleep apnea) 06/15/2017   Moderate with AHI 16.6/hr now on CPAP at 12cm H2O  . Presence of permanent cardiac pacemaker   . Seasonal allergies   . Sick sinus syndrome ([place])    s/p dual chamber pacemaker in 2018  . Stroke [facility])    "I've had 2 minor ones"; denies residual on 06/28/2016   Surgical Hx: The patient  has a past surgical history that includes Appendectomy; Fracture surgery; Wrist fracture surgery (Left, 1950s); Elbow surgery (Right); Inguinal hernia repair (Right, 08/2010); Cataract extraction w/ intraocular lens  implant, bilateral (Bilateral, ~ 2007); Cardiac catheterization (N/A, 04/01/2016); Cardiac catheterization (N/A, 04/01/2016); PACEMAKER IMPLANT (N/A, 06/28/2016); and PACEMAKER IMPLANT (06/28/2016).   Current Medications: Current Meds  Medication Sig  . amLODipine (NORVASC) 10 MG tablet Take 10 mg by mouth daily.  Marland Kitchen atorvastatin (LIPITOR) 10 MG tablet Take 10 mg by mouth daily.  Marland Kitchen azithromycin (ZITHROMAX) 250 MG tablet Take 250 mg by mouth as needed.  . carbamazepine (TEGRETOL) 200 MG tablet Take 200 mg by mouth 4 (four) times daily.  . carvedilol (COREG) 12.5 MG tablet Take 1 tablet (12.5 mg total) by mouth 2 (two) times daily.  . Cholecalciferol (VITAMIN D) 50 MCG (2000 UT) tablet Take 2,000 Units by mouth daily.  . citalopram (CELEXA) 40 MG tablet Take 40 mg by mouth daily.  . clorazepate (TRANXENE) 3.75 MG tablet Take 3.75 mg by mouth 2 (two) times  daily.  Marland Kitchen dipyridamole-aspirin (AGGRENOX) 200-25 MG 12hr capsule Take 2 capsules by mouth at bedtime.   Marland Kitchen EPINEPHrine 0.3 mg/0.3 mL IJ SOAJ injection INJECT 0.3 ML INTRAMUSCULARLY  AS NEEDED  . erythromycin ophthalmic ointment Place into both eyes 2 times daily for 21 days. Apply ointment to both upper eyelid incisions in the morning and at bedtime for 3 weeks  . fluticasone (FLONASE) 50 MCG/ACT nasal spray Place 2 sprays into both nostrils at bedtime as needed for allergies or rhinitis.  . folic acid (FOLVITE) 1 MG tablet Take 1 mg by mouth daily.  Marland Kitchen gabapentin (NEURONTIN) 800 MG tablet Take 800 mg by mouth 2 (two) times daily.  . isosorbide mononitrate (IMDUR) 30 MG 24 hr tablet Take 1 tablet (30 mg total)  by mouth daily.  Marland Kitchen lisinopril (PRINIVIL,ZESTRIL) 40 MG tablet Take 1 tablet by mouth daily  . nitroGLYCERIN (NITROSTAT) 0.4 MG SL tablet Place 1 tablet (0.4 mg total) under the tongue every 5 (five) minutes x 3 doses as needed for chest pain.  Marland Kitchen Propylene Glycol (SYSTANE BALANCE) 0.6 % SOLN Place 1 drop into both eyes daily as needed (dry eyes).     Allergies:   Cyclosporine and Serzone [nefazodone hcl]   Social History   Tobacco Use  . Smoking status: Former Smoker    Years: 1.00  . Smokeless tobacco: Never Used  . Tobacco comment: 06/29/2016 "smoked for ~ 1 yr while in the service"  Vaping Use  . Vaping Use: Never used  Substance Use Topics  . Alcohol use: No  . Drug use: No     Family Hx: The patient's family history includes Cancer in his father; Heart attack in his mother; Heart disease in his mother.  ROS:   Please see the history of present illness.     Physical Exam: Blood pressure (!) 144/80, pulse 61, height 5\' 5"  (1.651 m), weight 189 lb (85.7 kg), SpO2 95 %.  GEN:  Elderly male,  NAD  HEENT: Normal NECK: No JVD; No carotid bruits LYMPHATICS: No lymphadenopathy CARDIAC: RRR , no murmurs, rubs, gallops RESPIRATORY:  Clear to auscultation without rales, wheezing  or rhonchi  ABDOMEN: Soft, non-tender, non-distended MUSCULOSKELETAL:  No edema; No deformity  SKIN: Warm and dry NEUROLOGIC:  Alert and oriented x 3   EKGs/Labs/Other Test Reviewed:    EKG:    Feb. 28, 2022:   NSR at 61.  NO St or T wave changes.   Recent Labs: 05/22/2019: ALT 16 09/30/2019: BUN 6; Creatinine, Ser 0.81; Potassium 4.9; Sodium 134   Recent Lipid Panel Lab Results  Component Value Date/Time   CHOL 126 05/22/2019 11:10 AM   TRIG 125 05/22/2019 11:10 AM   HDL 51 05/22/2019 11:10 AM   CHOLHDL 2.5 05/22/2019 11:10 AM   CHOLHDL 2.6 04/02/2016 05:00 AM   LDLCALC 53 05/22/2019 11:10 AM   From [facility] Tool: Cholesterol, total 128.000 08/18/2017 HDL 47.000 08/18/2017 LDL 58.000 08/18/2017 Triglycerides 119.000 08/18/2017 Hemoglobin 14.300 08/18/2017 Creatinine, Serum 0.670 10/04/2017 Potassium 5.000 10/04/2017 Magnesium N/D ALT (SGPT) 14.000 08/18/2017 TSH 3.510 08/18/2017 INR 0.990 04/01/2016 Platelets 124.000 06/28/2016      ASSESSMENT & PLAN:    Shortness of breath   Coronary artery disease involving native coronary artery of native heart with angina pectoris ([place]) He is having some occasional chest pain described as quick jabs but no episodes of sustained pain that sounds like ischemia.  I reassured him that this is not sound like a coronary artery issue.  Essential hypertension  - BP is a little elevated.  He eats a bit more salt than he should because his sodium levels have been low.  I have suggested that he is probably drinking too much water or in his case diet soda.  He drinks 4-5 large cups of diet soda every day.  Encouraged him to work on cutting back on his soda intake and also cutting back on his salt intake.   Hyperlipidemia LDL goal <70: Lipids look fairly well controlled.  Cardiac pacemaker in situ     Medication Adjustments/Labs and Tests Ordered: Current medicines are reviewed at length with the patient today.  Concerns regarding medicines are  outlined above.  Tests Ordered: Orders Placed This Encounter  Procedures  . EKG 12-Lead   Medication  Changes: No orders of the defined types were placed in this encounter.   Signed, Mertie Moores, MD  05/18/2020 4:47 PM    [address], [place], [place]  [address] Phone: [phone]; Fax: [phone]

## 2020-05-18 ENCOUNTER — Ambulatory Visit (INDEPENDENT_AMBULATORY_CARE_PROVIDER_SITE_OTHER): Payer: Medicare Other | Admitting: Cardiovascular Disease

## 2020-05-18 ENCOUNTER — Encounter: Payer: Self-pay | Admitting: Cardiovascular Disease

## 2020-05-18 ENCOUNTER — Other Ambulatory Visit: Payer: Self-pay

## 2020-05-18 VITALS — BP 144/80 | HR 61 | Ht 65.0 in | Wt 189.0 lb

## 2020-05-18 DIAGNOSIS — I495 Sick sinus syndrome: Secondary | ICD-10-CM

## 2020-05-18 DIAGNOSIS — I25119 Atherosclerotic heart disease of native coronary artery with unspecified angina pectoris: Secondary | ICD-10-CM

## 2020-05-18 NOTE — Patient Instructions (Signed)

## 2020-06-30 DIAGNOSIS — Z48810 Encounter for surgical aftercare following surgery on the sense organs: Secondary | ICD-10-CM | POA: Diagnosis not present

## 2020-07-06 ENCOUNTER — Ambulatory Visit (INDEPENDENT_AMBULATORY_CARE_PROVIDER_SITE_OTHER): Payer: Medicare Other

## 2020-07-06 DIAGNOSIS — I495 Sick sinus syndrome: Secondary | ICD-10-CM | POA: Diagnosis not present

## 2020-07-08 LAB — CUP PACEART REMOTE DEVICE CHECK
Battery Remaining Longevity: 121 mo
Battery Remaining Percentage: 95.5 %
Battery Voltage: 2.99 V
Brady Statistic AP VP Percent: 1 %
Brady Statistic AP VS Percent: 91 %
Brady Statistic AS VP Percent: 1 %
Brady Statistic AS VS Percent: 9.1 %
Brady Statistic RA Percent Paced: 90 %
Brady Statistic RV Percent Paced: 1 %
Date Time Interrogation Session: 20220418054435
Implantable Lead Implant Date: 20180410
Implantable Lead Implant Date: 20180410
Implantable Lead Location: 753859
Implantable Lead Location: 753860
Implantable Pulse Generator Implant Date: 20180410
Lead Channel Impedance Value: 460 Ohm
Lead Channel Impedance Value: 540 Ohm
Lead Channel Pacing Threshold Amplitude: 0.5 V
Lead Channel Pacing Threshold Amplitude: 0.75 V
Lead Channel Pacing Threshold Pulse Width: 0.5 ms
Lead Channel Pacing Threshold Pulse Width: 0.5 ms
Lead Channel Sensing Intrinsic Amplitude: 0.7 mV
Lead Channel Sensing Intrinsic Amplitude: 7.6 mV
Lead Channel Setting Pacing Amplitude: 1 V
Lead Channel Setting Pacing Amplitude: 2 V
Lead Channel Setting Pacing Pulse Width: 0.5 ms
Lead Channel Setting Sensing Sensitivity: 2 mV
Pulse Gen Model: 2272
Pulse Gen Serial Number: 8016790

## 2020-07-15 DIAGNOSIS — M67441 Ganglion, right hand: Secondary | ICD-10-CM | POA: Diagnosis not present

## 2020-07-15 DIAGNOSIS — M1812 Unilateral primary osteoarthritis of first carpometacarpal joint, left hand: Secondary | ICD-10-CM | POA: Diagnosis not present

## 2020-07-22 DIAGNOSIS — N400 Enlarged prostate without lower urinary tract symptoms: Secondary | ICD-10-CM | POA: Diagnosis not present

## 2020-07-22 DIAGNOSIS — I2510 Atherosclerotic heart disease of native coronary artery without angina pectoris: Secondary | ICD-10-CM | POA: Diagnosis not present

## 2020-07-22 DIAGNOSIS — M1812 Unilateral primary osteoarthritis of first carpometacarpal joint, left hand: Secondary | ICD-10-CM | POA: Diagnosis not present

## 2020-07-22 DIAGNOSIS — I10 Essential (primary) hypertension: Secondary | ICD-10-CM | POA: Diagnosis not present

## 2020-07-22 DIAGNOSIS — F329 Major depressive disorder, single episode, unspecified: Secondary | ICD-10-CM | POA: Diagnosis not present

## 2020-07-22 DIAGNOSIS — E782 Mixed hyperlipidemia: Secondary | ICD-10-CM | POA: Diagnosis not present

## 2020-07-22 NOTE — Progress Notes (Signed)
Remote pacemaker transmission.   

## 2020-09-08 DIAGNOSIS — F329 Major depressive disorder, single episode, unspecified: Secondary | ICD-10-CM | POA: Diagnosis not present

## 2020-09-08 DIAGNOSIS — M545 Low back pain, unspecified: Secondary | ICD-10-CM | POA: Diagnosis not present

## 2020-09-08 DIAGNOSIS — Z91038 Other insect allergy status: Secondary | ICD-10-CM | POA: Diagnosis not present

## 2020-09-08 DIAGNOSIS — E782 Mixed hyperlipidemia: Secondary | ICD-10-CM | POA: Diagnosis not present

## 2020-09-08 DIAGNOSIS — E559 Vitamin D deficiency, unspecified: Secondary | ICD-10-CM | POA: Diagnosis not present

## 2020-09-08 DIAGNOSIS — Z0000 Encounter for general adult medical examination without abnormal findings: Secondary | ICD-10-CM | POA: Diagnosis not present

## 2020-09-08 DIAGNOSIS — Z1389 Encounter for screening for other disorder: Secondary | ICD-10-CM | POA: Diagnosis not present

## 2020-09-08 DIAGNOSIS — I10 Essential (primary) hypertension: Secondary | ICD-10-CM | POA: Diagnosis not present

## 2020-09-08 DIAGNOSIS — E871 Hypo-osmolality and hyponatremia: Secondary | ICD-10-CM | POA: Diagnosis not present

## 2020-09-08 DIAGNOSIS — Z8673 Personal history of transient ischemic attack (TIA), and cerebral infarction without residual deficits: Secondary | ICD-10-CM | POA: Diagnosis not present

## 2020-09-08 DIAGNOSIS — R198 Other specified symptoms and signs involving the digestive system and abdomen: Secondary | ICD-10-CM | POA: Diagnosis not present

## 2020-09-08 DIAGNOSIS — D696 Thrombocytopenia, unspecified: Secondary | ICD-10-CM | POA: Diagnosis not present

## 2020-09-10 DIAGNOSIS — E782 Mixed hyperlipidemia: Secondary | ICD-10-CM | POA: Diagnosis not present

## 2020-09-10 DIAGNOSIS — F329 Major depressive disorder, single episode, unspecified: Secondary | ICD-10-CM | POA: Diagnosis not present

## 2020-09-10 DIAGNOSIS — I10 Essential (primary) hypertension: Secondary | ICD-10-CM | POA: Diagnosis not present

## 2020-09-10 DIAGNOSIS — I2510 Atherosclerotic heart disease of native coronary artery without angina pectoris: Secondary | ICD-10-CM | POA: Diagnosis not present

## 2020-09-10 DIAGNOSIS — M1812 Unilateral primary osteoarthritis of first carpometacarpal joint, left hand: Secondary | ICD-10-CM | POA: Diagnosis not present

## 2020-09-10 DIAGNOSIS — N400 Enlarged prostate without lower urinary tract symptoms: Secondary | ICD-10-CM | POA: Diagnosis not present

## 2020-09-30 DIAGNOSIS — E782 Mixed hyperlipidemia: Secondary | ICD-10-CM | POA: Diagnosis not present

## 2020-09-30 DIAGNOSIS — I2510 Atherosclerotic heart disease of native coronary artery without angina pectoris: Secondary | ICD-10-CM | POA: Diagnosis not present

## 2020-09-30 DIAGNOSIS — M1812 Unilateral primary osteoarthritis of first carpometacarpal joint, left hand: Secondary | ICD-10-CM | POA: Diagnosis not present

## 2020-09-30 DIAGNOSIS — N400 Enlarged prostate without lower urinary tract symptoms: Secondary | ICD-10-CM | POA: Diagnosis not present

## 2020-09-30 DIAGNOSIS — F329 Major depressive disorder, single episode, unspecified: Secondary | ICD-10-CM | POA: Diagnosis not present

## 2020-09-30 DIAGNOSIS — I10 Essential (primary) hypertension: Secondary | ICD-10-CM | POA: Diagnosis not present

## 2020-10-05 ENCOUNTER — Ambulatory Visit (INDEPENDENT_AMBULATORY_CARE_PROVIDER_SITE_OTHER): Payer: Medicare Other

## 2020-10-05 DIAGNOSIS — I495 Sick sinus syndrome: Secondary | ICD-10-CM | POA: Diagnosis not present

## 2020-10-06 LAB — CUP PACEART REMOTE DEVICE CHECK
Battery Remaining Longevity: 73 mo
Battery Remaining Percentage: 62 %
Battery Voltage: 2.99 V
Brady Statistic AP VP Percent: 1 %
Brady Statistic AP VS Percent: 89 %
Brady Statistic AS VP Percent: 1 %
Brady Statistic AS VS Percent: 11 %
Brady Statistic RA Percent Paced: 89 %
Brady Statistic RV Percent Paced: 1 %
Date Time Interrogation Session: 20220718042029
Implantable Lead Implant Date: 20180410
Implantable Lead Implant Date: 20180410
Implantable Lead Location: 753859
Implantable Lead Location: 753860
Implantable Pulse Generator Implant Date: 20180410
Lead Channel Impedance Value: 450 Ohm
Lead Channel Impedance Value: 530 Ohm
Lead Channel Pacing Threshold Amplitude: 0.5 V
Lead Channel Pacing Threshold Amplitude: 0.75 V
Lead Channel Pacing Threshold Pulse Width: 0.5 ms
Lead Channel Pacing Threshold Pulse Width: 0.5 ms
Lead Channel Sensing Intrinsic Amplitude: 0.5 mV
Lead Channel Sensing Intrinsic Amplitude: 8.2 mV
Lead Channel Setting Pacing Amplitude: 1 V
Lead Channel Setting Pacing Amplitude: 2 V
Lead Channel Setting Pacing Pulse Width: 0.5 ms
Lead Channel Setting Sensing Sensitivity: 2 mV
Pulse Gen Model: 2272
Pulse Gen Serial Number: 8016790

## 2020-10-28 NOTE — Progress Notes (Signed)
Remote pacemaker transmission.   

## 2020-11-10 DIAGNOSIS — L820 Inflamed seborrheic keratosis: Secondary | ICD-10-CM | POA: Diagnosis not present

## 2020-11-10 DIAGNOSIS — L814 Other melanin hyperpigmentation: Secondary | ICD-10-CM | POA: Diagnosis not present

## 2020-11-10 DIAGNOSIS — D225 Melanocytic nevi of trunk: Secondary | ICD-10-CM | POA: Diagnosis not present

## 2020-11-10 DIAGNOSIS — D485 Neoplasm of uncertain behavior of skin: Secondary | ICD-10-CM | POA: Diagnosis not present

## 2020-11-10 DIAGNOSIS — L738 Other specified follicular disorders: Secondary | ICD-10-CM | POA: Diagnosis not present

## 2020-11-10 DIAGNOSIS — L281 Prurigo nodularis: Secondary | ICD-10-CM | POA: Diagnosis not present

## 2020-11-12 DIAGNOSIS — G5603 Carpal tunnel syndrome, bilateral upper limbs: Secondary | ICD-10-CM | POA: Diagnosis not present

## 2020-11-12 DIAGNOSIS — Z760 Encounter for issue of repeat prescription: Secondary | ICD-10-CM | POA: Diagnosis not present

## 2020-11-12 DIAGNOSIS — G40209 Localization-related (focal) (partial) symptomatic epilepsy and epileptic syndromes with complex partial seizures, not intractable, without status epilepticus: Secondary | ICD-10-CM | POA: Diagnosis not present

## 2020-11-12 DIAGNOSIS — B004 Herpesviral encephalitis: Secondary | ICD-10-CM | POA: Diagnosis not present

## 2020-11-23 DIAGNOSIS — Z20822 Contact with and (suspected) exposure to covid-19: Secondary | ICD-10-CM | POA: Diagnosis not present

## 2020-11-24 DIAGNOSIS — R059 Cough, unspecified: Secondary | ICD-10-CM | POA: Diagnosis not present

## 2020-11-24 DIAGNOSIS — Z03818 Encounter for observation for suspected exposure to other biological agents ruled out: Secondary | ICD-10-CM | POA: Diagnosis not present

## 2020-11-24 DIAGNOSIS — B349 Viral infection, unspecified: Secondary | ICD-10-CM | POA: Diagnosis not present

## 2020-12-30 DIAGNOSIS — Z23 Encounter for immunization: Secondary | ICD-10-CM | POA: Diagnosis not present

## 2021-01-04 ENCOUNTER — Ambulatory Visit (INDEPENDENT_AMBULATORY_CARE_PROVIDER_SITE_OTHER): Payer: Medicare Other

## 2021-01-04 DIAGNOSIS — I495 Sick sinus syndrome: Secondary | ICD-10-CM | POA: Diagnosis not present

## 2021-01-05 LAB — CUP PACEART REMOTE DEVICE CHECK
Battery Remaining Longevity: 71 mo
Battery Remaining Percentage: 60 %
Battery Voltage: 2.99 V
Brady Statistic AP VP Percent: 1 %
Brady Statistic AP VS Percent: 91 %
Brady Statistic AS VP Percent: 1 %
Brady Statistic AS VS Percent: 9 %
Brady Statistic RA Percent Paced: 90 %
Brady Statistic RV Percent Paced: 1 %
Date Time Interrogation Session: 20221017050539
Implantable Lead Implant Date: 20180410
Implantable Lead Implant Date: 20180410
Implantable Lead Location: 753859
Implantable Lead Location: 753860
Implantable Pulse Generator Implant Date: 20180410
Lead Channel Impedance Value: 460 Ohm
Lead Channel Impedance Value: 530 Ohm
Lead Channel Pacing Threshold Amplitude: 0.5 V
Lead Channel Pacing Threshold Amplitude: 0.625 V
Lead Channel Pacing Threshold Pulse Width: 0.5 ms
Lead Channel Pacing Threshold Pulse Width: 0.5 ms
Lead Channel Sensing Intrinsic Amplitude: 1.8 mV
Lead Channel Sensing Intrinsic Amplitude: 6.7 mV
Lead Channel Setting Pacing Amplitude: 0.875
Lead Channel Setting Pacing Amplitude: 2 V
Lead Channel Setting Pacing Pulse Width: 0.5 ms
Lead Channel Setting Sensing Sensitivity: 2 mV
Pulse Gen Model: 2272
Pulse Gen Serial Number: 8016790

## 2021-01-06 DIAGNOSIS — N400 Enlarged prostate without lower urinary tract symptoms: Secondary | ICD-10-CM | POA: Diagnosis not present

## 2021-01-06 DIAGNOSIS — F329 Major depressive disorder, single episode, unspecified: Secondary | ICD-10-CM | POA: Diagnosis not present

## 2021-01-06 DIAGNOSIS — E782 Mixed hyperlipidemia: Secondary | ICD-10-CM | POA: Diagnosis not present

## 2021-01-06 DIAGNOSIS — I2510 Atherosclerotic heart disease of native coronary artery without angina pectoris: Secondary | ICD-10-CM | POA: Diagnosis not present

## 2021-01-06 DIAGNOSIS — M1812 Unilateral primary osteoarthritis of first carpometacarpal joint, left hand: Secondary | ICD-10-CM | POA: Diagnosis not present

## 2021-01-06 DIAGNOSIS — I10 Essential (primary) hypertension: Secondary | ICD-10-CM | POA: Diagnosis not present

## 2021-01-13 NOTE — Progress Notes (Signed)
Remote pacemaker transmission.   

## 2021-02-01 DIAGNOSIS — H1589 Other disorders of sclera: Secondary | ICD-10-CM | POA: Diagnosis not present

## 2021-02-01 DIAGNOSIS — Z961 Presence of intraocular lens: Secondary | ICD-10-CM | POA: Diagnosis not present

## 2021-02-21 NOTE — Progress Notes (Signed)
Electrophysiology Office Note Date: 02/22/2021  ID:  Aaron Keller, DOB [birth year], MRN [id number]  PCP: Antony Contras, MD Primary Cardiologist: Mertie Moores, MD Electrophysiologist: Thompson Grayer, MD   CC: Pacemaker follow-up  Aaron Keller is a 75 y.o. male seen today for Thompson Grayer, MD for routine electrophysiology followup.  Since last being seen in our clinic the patient reports doing very well. He has chronic, stable angina with no recent worsening. he denies palpitations, dyspnea, PND, orthopnea, nausea, vomiting, dizziness, syncope, edema, weight gain, or early satiety.  Device History: STJ dual chamber PPM implanted 2018 for SSS  Past Medical History:  Diagnosis Date   CAD (coronary artery disease) 04/04/2016   LHC 1/18: mLAD 35, dLAD 70/90, oLPDA 50, mRCA 60, EF 55-65 // Nuc Stress 10/19:  EF 64, normal perfusion, low risk   Echocardiogram    Echo 1/18: Moderate concentric LVH, EF 65-70, normal wall motion, grade 1 diastolic dysfunction, mild AI, trivial TR   Encephalitis 02/18/1985   Hepatitis ~ 1959   "don't know what kind"   HTN (hypertension) 04/04/2016   Hx of tonic-clonic seizures 02/18/1985 X 1   related to encephalitis   Hypertension    Nasal bleeding    "because of one of the RX I was on" (06/29/2016)   Obesity (BMI 30-39.9) 06/15/2017   OSA (obstructive sleep apnea) 06/15/2017   Moderate with AHI 16.6/hr now on CPAP at 12cm H2O   Presence of permanent cardiac pacemaker    Seasonal allergies    Sick sinus syndrome [facility])    s/p dual chamber pacemaker in 2018   Stroke [facility])    "I've had 2 minor ones"; denies residual on 06/28/2016   Past Surgical History:  Procedure Laterality Date   APPENDECTOMY     CARDIAC CATHETERIZATION N/A 04/01/2016   Procedure: Left Heart Cath and Coronary Angiography;  Surgeon: Peter M Martinique, MD;  Location: [place] CV [facility];  Service: Cardiovascular;  Laterality: N/A;   CARDIAC CATHETERIZATION N/A 04/01/2016   Procedure:  Temporary Pacemaker;  Surgeon: Peter M Martinique, MD;  Location: [place] CV [facility];  Service: Cardiovascular;  Laterality: N/A;   CATARACT EXTRACTION W/ INTRAOCULAR LENS  IMPLANT, BILATERAL Bilateral ~ 2007   ELBOW SURGERY Right    FRACTURE SURGERY     INGUINAL HERNIA REPAIR Right 08/2010   PACEMAKER IMPLANT N/A 06/28/2016   Procedure: Pacemaker Implant;  Surgeon: Thompson Grayer, MD;  Location: [place] CV [facility];  Service: Cardiovascular;  Laterality: N/A;   PACEMAKER IMPLANT  06/28/2016   SJM Assurity MRI PPM implanted by Dr Rayann Heman for sick sinus syndrome   WRIST FRACTURE SURGERY Left 1950s    Current Outpatient Medications  Medication Sig Dispense Refill   amLODipine (NORVASC) 10 MG tablet Take 10 mg by mouth daily.     atorvastatin (LIPITOR) 10 MG tablet Take 10 mg by mouth daily.     azithromycin (ZITHROMAX) 250 MG tablet Take 250 mg by mouth as needed.     carbamazepine (TEGRETOL) 200 MG tablet Take 200 mg by mouth 4 (four) times daily.     carvedilol (COREG) 12.5 MG tablet Take 1 tablet (12.5 mg total) by mouth 2 (two) times daily. 180 tablet 3   Cholecalciferol (VITAMIN D) 50 MCG (2000 UT) tablet Take 2,000 Units by mouth daily.     citalopram (CELEXA) 40 MG tablet Take 40 mg by mouth daily.     clorazepate (TRANXENE) 3.75 MG tablet Take 3.75 mg by mouth 2 (two)  times daily.     dipyridamole-aspirin (AGGRENOX) 200-25 MG 12hr capsule Take 2 capsules by mouth at bedtime.      EPINEPHrine 0.3 mg/0.3 mL IJ SOAJ injection INJECT 0.3 ML INTRAMUSCULARLY  AS NEEDED     fluticasone (FLONASE) 50 MCG/ACT nasal spray Place 2 sprays into both nostrils at bedtime as needed for allergies or rhinitis.     folic acid (FOLVITE) 1 MG tablet Take 1 mg by mouth daily.     gabapentin (NEURONTIN) 800 MG tablet Take 800 mg by mouth 2 (two) times daily.     isosorbide mononitrate (IMDUR) 30 MG 24 hr tablet Take 1 tablet (30 mg total) by mouth daily. 90 tablet 3   lisinopril (PRINIVIL,ZESTRIL) 40 MG tablet Take  1 tablet by mouth daily 90 tablet 3   nitroGLYCERIN (NITROSTAT) 0.4 MG SL tablet Place 1 tablet (0.4 mg total) under the tongue every 5 (five) minutes x 3 doses as needed for chest pain. 25 tablet 4   Propylene Glycol (SYSTANE BALANCE) 0.6 % SOLN Place 1 drop into both eyes daily as needed (dry eyes).     No current facility-administered medications for this visit.    Allergies:   Cyclosporine and Serzone [nefazodone hcl]   Social History: Social History   Socioeconomic History   Marital status: Married    Spouse name: Not on file   Number of children: Not on file   Years of education: Not on file   Highest education level: Not on file  Occupational History   Not on file  Tobacco Use   Smoking status: Former    Years: 1.00    Types: Cigarettes   Smokeless tobacco: Never   Tobacco comments:    06/29/2016 "smoked for ~ 1 yr while in the service"  Vaping Use   Vaping Use: Never used  Substance and Sexual Activity   Alcohol use: No   Drug use: No   Sexual activity: Never  Other Topics Concern   Not on file  Social History Narrative   Not on file   Social Determinants of Health   Financial Resource Strain: Not on file  Food Insecurity: Not on file  Transportation Needs: Not on file  Physical Activity: Not on file  Stress: Not on file  Social Connections: Not on file  Intimate Partner Violence: Not on file    Family History: Family History  Problem Relation Age of Onset   Heart attack Mother        in her 54's   Heart disease Mother        skipped beats   Cancer Father        liver     Review of Systems: All other systems reviewed and are otherwise negative except as noted above.  Physical Exam: Vitals:   02/22/21 0954  BP: (!) 158/84  Pulse: 84  SpO2: 99%  Weight: 186 lb (84.4 kg)  Height: 5\' 4"  (1.626 m)     GEN- The patient is well appearing, alert and oriented x 3 today.   HEENT: normocephalic, atraumatic; sclera clear, conjunctiva pink; hearing  intact; oropharynx clear; neck supple  Lungs- Clear to ausculation bilaterally, normal work of breathing.  No wheezes, rales, rhonchi Heart- Regular rate and rhythm, no murmurs, rubs or gallops  GI- soft, non-tender, non-distended, bowel sounds present  Extremities- no clubbing or cyanosis. No edema MS- no significant deformity or atrophy Skin- warm and dry, no rash or lesion; PPM pocket well healed Psych- euthymic mood,  full affect Neuro- strength and sensation are intact  PPM Interrogation- reviewed in detail today,  See PACEART report  EKG:  EKG is not ordered today.   Recent Labs: No results found for requested labs within last 8760 hours.   Wt Readings from Last 3 Encounters:  02/22/21 186 lb (84.4 kg)  05/18/20 189 lb (85.7 kg)  12/05/19 187 lb (84.8 kg)     Other studies Reviewed: Additional studies/ records that were reviewed today include: Previous EP office notes, Previous remote checks, Most recent labwork.   Assessment and Plan:  1. Sick sinus syndrome s/p St. Jude PPM  Normal PPM function See Pace Art report No changes today  2.  CAD Stable angina Continue medical therapy Followed by Dr Acie Fredrickson   3.  HTN Slightly elevated today. He will monitor at home and call general team if regularly > 762 systolic for med adjustment.  Current medicines are reviewed at length with the patient today.    Disposition:   Follow up with Dr. Rayann Heman in 12 months. If Dr. Rayann Heman is no longer seeing patients with [place] at that time, They would like to be seen in [place], where they reside.    Jacalyn Lefevre, PA-C  02/22/2021 10:25 AM  [address] [place] [address] [phone] (office) [phone] (fax)

## 2021-02-22 ENCOUNTER — Ambulatory Visit (INDEPENDENT_AMBULATORY_CARE_PROVIDER_SITE_OTHER): Payer: Medicare Other | Admitting: Student

## 2021-02-22 ENCOUNTER — Encounter: Payer: Self-pay | Admitting: Student

## 2021-02-22 ENCOUNTER — Other Ambulatory Visit: Payer: Self-pay

## 2021-02-22 VITALS — BP 158/84 | HR 84 | Ht 64.0 in | Wt 186.0 lb

## 2021-02-22 DIAGNOSIS — I25119 Atherosclerotic heart disease of native coronary artery with unspecified angina pectoris: Secondary | ICD-10-CM

## 2021-02-22 DIAGNOSIS — I495 Sick sinus syndrome: Secondary | ICD-10-CM | POA: Diagnosis not present

## 2021-02-22 DIAGNOSIS — I10 Essential (primary) hypertension: Secondary | ICD-10-CM

## 2021-02-22 LAB — CUP PACEART INCLINIC DEVICE CHECK
Battery Remaining Longevity: 70 mo
Battery Voltage: 2.99 V
Brady Statistic RA Percent Paced: 91 %
Brady Statistic RV Percent Paced: 0.09 %
Date Time Interrogation Session: 20221205102451
Implantable Lead Implant Date: 20180410
Implantable Lead Implant Date: 20180410
Implantable Lead Location: 753859
Implantable Lead Location: 753860
Implantable Pulse Generator Implant Date: 20180410
Lead Channel Impedance Value: 487.5 Ohm
Lead Channel Impedance Value: 537.5 Ohm
Lead Channel Pacing Threshold Amplitude: 0.75 V
Lead Channel Pacing Threshold Amplitude: 0.75 V
Lead Channel Pacing Threshold Amplitude: 0.75 V
Lead Channel Pacing Threshold Pulse Width: 0.5 ms
Lead Channel Pacing Threshold Pulse Width: 0.5 ms
Lead Channel Pacing Threshold Pulse Width: 0.5 ms
Lead Channel Sensing Intrinsic Amplitude: 0.6 mV
Lead Channel Sensing Intrinsic Amplitude: 6.6 mV
Lead Channel Setting Pacing Amplitude: 1 V
Lead Channel Setting Pacing Amplitude: 2 V
Lead Channel Setting Pacing Pulse Width: 0.5 ms
Lead Channel Setting Sensing Sensitivity: 2 mV
Pulse Gen Model: 2272
Pulse Gen Serial Number: 8016790

## 2021-02-22 NOTE — Patient Instructions (Signed)
Medication Instructions:  Your physician recommends that you continue on your current medications as directed. Please refer to the Current Medication list given to you today.  *If you need a refill on your cardiac medications before your next appointment, please call your pharmacy*   Lab Work: None If you have labs (blood work) drawn today and your tests are completely normal, you will receive your results only by: [place] (if you have MyChart) OR A paper copy in the mail If you have any lab test that is abnormal or we need to change your treatment, we will call you to review the results.   Follow-Up: At [facility], you and your health needs are our priority.  As part of our continuing mission to provide you with exceptional heart care, we have created designated Provider Care Teams.  These Care Teams include your primary Cardiologist (physician) and Advanced Practice Providers (APPs -  Physician Assistants and Nurse Practitioners) who all work together to provide you with the care you need, when you need it.  We recommend signing up for the patient portal called "MyChart".  Sign up information is provided on this After Visit Summary.  MyChart is used to connect with patients for Virtual Visits (Telemedicine).  Patients are able to view lab/test results, encounter notes, upcoming appointments, etc.  Non-urgent messages can be sent to your provider as well.   To learn more about what you can do with MyChart, go to [email or web address].    Your next appointment:   1 year(s)  The format for your next appointment:   In Person  Provider:   You may see Thompson Grayer, MD or one of the following Advanced Practice Providers on your designated Care Team:   Tommye Standard, [place] Legrand Como "[facility]" [place], [place]

## 2021-03-04 ENCOUNTER — Telehealth: Payer: Self-pay | Admitting: Student

## 2021-03-04 DIAGNOSIS — I25119 Atherosclerotic heart disease of native coronary artery with unspecified angina pectoris: Secondary | ICD-10-CM | POA: Diagnosis not present

## 2021-03-04 DIAGNOSIS — M1812 Unilateral primary osteoarthritis of first carpometacarpal joint, left hand: Secondary | ICD-10-CM | POA: Diagnosis not present

## 2021-03-04 DIAGNOSIS — I2510 Atherosclerotic heart disease of native coronary artery without angina pectoris: Secondary | ICD-10-CM | POA: Diagnosis not present

## 2021-03-04 DIAGNOSIS — I10 Essential (primary) hypertension: Secondary | ICD-10-CM | POA: Diagnosis not present

## 2021-03-04 DIAGNOSIS — F329 Major depressive disorder, single episode, unspecified: Secondary | ICD-10-CM | POA: Diagnosis not present

## 2021-03-04 DIAGNOSIS — E782 Mixed hyperlipidemia: Secondary | ICD-10-CM | POA: Diagnosis not present

## 2021-03-04 DIAGNOSIS — N400 Enlarged prostate without lower urinary tract symptoms: Secondary | ICD-10-CM | POA: Diagnosis not present

## 2021-03-04 MED ORDER — CARVEDILOL 12.5 MG PO TABS: 25.0000 mg | ORAL_TABLET | Freq: Two times a day (BID) | ORAL | 3 refills | Status: DC

## 2021-03-04 NOTE — Telephone Encounter (Signed)
°  Pt c/o BP issue: STAT if pt c/o blurred vision, one-sided weakness or slurred speech  1. What are your last 5 BP readings? 155/90 HR upper 60s  2. Are you having any other symptoms (ex. Dizziness, headache, blurred vision, passed out)? No symptoms   3. What is your BP issue? Juliann Pulse with [facility], pt was seen yesterday at [facility], and pt said his BP daily average is around 155/90 and HR is in the upper 60s. Per pt this is his BP since when he was seen on 02/22/21. Any questions can call Juliann Pulse or any recommendation can call the pt

## 2021-03-04 NOTE — Telephone Encounter (Signed)
Spoke with pt and advised per Oda Kilts, PA-C, Pt can increase coreg to 25 mg BID , HR won't drop with PPM in place. Pt advised he may take 2 of his current 12.5mg  tablets twice daily to equal the 25mg  bid.  [facility].  Pt states he will do this and continue to monitor BP.  If he tolerated medication without problems he will contact us for a script to take to the [place].  Pt verbalizes understanding of instructions provided and agrees with current plan.

## 2021-03-17 ENCOUNTER — Telehealth: Payer: Self-pay | Admitting: Cardiovascular Disease

## 2021-03-17 DIAGNOSIS — I10 Essential (primary) hypertension: Secondary | ICD-10-CM

## 2021-03-17 MED ORDER — CARVEDILOL 25 MG PO TABS: 25.0000 mg | ORAL_TABLET | Freq: Two times a day (BID) | ORAL | 1 refills | Status: DC

## 2021-03-17 MED ORDER — CARVEDILOL 25 MG PO TABS: 25.0000 mg | ORAL_TABLET | Freq: Two times a day (BID) | ORAL | 0 refills | Status: AC

## 2021-03-17 NOTE — Telephone Encounter (Signed)
Pt's wife Neoma Laming is calling because pt's carvedilol was recently increased and it is not working well because pt's BP is still elevated.  Pt's wife would like someone to call the pt and advise him to stop taking this medicine or figure out what he needs to take and send a refill.

## 2021-03-17 NOTE — Telephone Encounter (Signed)
Spoke with wife (patient present on Speakerphone) regarding pt's BP readings since the increase to 25mg  BID on his Carvedilol. Pt and wife Neoma Laming condone that since 32/7 pt's BP systolic reading has ranged from 614-709 and diastolic range has been 29-57. Neoma Laming (wife) did state that one reading on 12/7 was elevated at 160/91, but condones that patient had been moving around/working and it was 4pm at time of reading. Wife states that pt is running out of Carvedilol since increasing his dose and will need more medication to carry him through to next appointment. Both pt and wife condone that pt is still taking same dosages of Amlodipine and Lisinopril. Pt requesting renewal RX for Carvedilol be sent to [facility], but unaware of how quickly he will receive this. Requesting a short supply [facility] ([address] until [place] prescription arrives. Sent medication (90 days with 1 refill) to [place], as well as a 1 week supply to [facility]. Pt still has 6 days worth of Carvedilol left in his possession and condones that this along with [facility] RX will carry him through until he receives [place] supply.  Scheduled next OV at this time as well (05/17/20) Educated pt to check BP at approx. Same time each day and wait approx.  2 hours after taking medications.

## 2021-03-18 NOTE — Telephone Encounter (Signed)
Aaron Keller, Aaron Keller - 03/17/2021  3:25 PM Chalmers Cater Satira Mccallum, PA-C  Sent: Thu March 18, 2021  8:58 AM  To: Ma Hillock, RN; P Cv Div [facility] St Triage          Message    Please refer to pharmD/HTN clinic for resistant HTN.       Please instruct to continue coreg 25 mg BID and explain without it his BP would be even higher     Left the pt a message to call the office back to endorse recommendations per Oda Kilts PA-C.

## 2021-03-26 NOTE — Telephone Encounter (Signed)
The patient has been notified of the result and verbalized understanding.  All questions (if any) were answered. Doral Ventrella, [facility] 03/26/2021 2:58 PM   Referral placed for PharmD HTN Clinic

## 2021-03-26 NOTE — Addendum Note (Signed)
Addended by: Carylon Perches on: 03/26/2021 02:59 PM   Modules accepted: Orders

## 2021-03-29 DIAGNOSIS — D696 Thrombocytopenia, unspecified: Secondary | ICD-10-CM | POA: Diagnosis not present

## 2021-03-29 DIAGNOSIS — F329 Major depressive disorder, single episode, unspecified: Secondary | ICD-10-CM | POA: Diagnosis not present

## 2021-03-29 DIAGNOSIS — N400 Enlarged prostate without lower urinary tract symptoms: Secondary | ICD-10-CM | POA: Diagnosis not present

## 2021-03-29 DIAGNOSIS — G629 Polyneuropathy, unspecified: Secondary | ICD-10-CM | POA: Diagnosis not present

## 2021-03-29 DIAGNOSIS — E559 Vitamin D deficiency, unspecified: Secondary | ICD-10-CM | POA: Diagnosis not present

## 2021-03-29 DIAGNOSIS — E782 Mixed hyperlipidemia: Secondary | ICD-10-CM | POA: Diagnosis not present

## 2021-03-29 DIAGNOSIS — E871 Hypo-osmolality and hyponatremia: Secondary | ICD-10-CM | POA: Diagnosis not present

## 2021-03-29 DIAGNOSIS — I10 Essential (primary) hypertension: Secondary | ICD-10-CM | POA: Diagnosis not present

## 2021-03-29 DIAGNOSIS — G40909 Epilepsy, unspecified, not intractable, without status epilepticus: Secondary | ICD-10-CM | POA: Diagnosis not present

## 2021-03-29 DIAGNOSIS — J309 Allergic rhinitis, unspecified: Secondary | ICD-10-CM | POA: Diagnosis not present

## 2021-03-29 DIAGNOSIS — Z8673 Personal history of transient ischemic attack (TIA), and cerebral infarction without residual deficits: Secondary | ICD-10-CM | POA: Diagnosis not present

## 2021-04-05 ENCOUNTER — Ambulatory Visit (INDEPENDENT_AMBULATORY_CARE_PROVIDER_SITE_OTHER): Payer: Medicare Other

## 2021-04-05 DIAGNOSIS — I495 Sick sinus syndrome: Secondary | ICD-10-CM

## 2021-04-05 LAB — CUP PACEART REMOTE DEVICE CHECK
Battery Remaining Longevity: 67 mo
Battery Remaining Percentage: 57 %
Battery Voltage: 2.99 V
Brady Statistic AP VP Percent: 1 %
Brady Statistic AP VS Percent: 92 %
Brady Statistic AS VP Percent: 1 %
Brady Statistic AS VS Percent: 7.9 %
Brady Statistic RA Percent Paced: 91 %
Brady Statistic RV Percent Paced: 1 %
Date Time Interrogation Session: 20230116024426
Implantable Lead Implant Date: 20180410
Implantable Lead Implant Date: 20180410
Implantable Lead Location: 753859
Implantable Lead Location: 753860
Implantable Pulse Generator Implant Date: 20180410
Lead Channel Impedance Value: 450 Ohm
Lead Channel Impedance Value: 490 Ohm
Lead Channel Pacing Threshold Amplitude: 0.625 V
Lead Channel Pacing Threshold Amplitude: 0.75 V
Lead Channel Pacing Threshold Pulse Width: 0.5 ms
Lead Channel Pacing Threshold Pulse Width: 0.5 ms
Lead Channel Sensing Intrinsic Amplitude: 0.5 mV
Lead Channel Sensing Intrinsic Amplitude: 5.7 mV
Lead Channel Setting Pacing Amplitude: 0.875
Lead Channel Setting Pacing Amplitude: 2 V
Lead Channel Setting Pacing Pulse Width: 0.5 ms
Lead Channel Setting Sensing Sensitivity: 2 mV
Pulse Gen Model: 2272
Pulse Gen Serial Number: 8016790

## 2021-04-08 ENCOUNTER — Other Ambulatory Visit: Payer: Self-pay

## 2021-04-08 ENCOUNTER — Ambulatory Visit (INDEPENDENT_AMBULATORY_CARE_PROVIDER_SITE_OTHER): Payer: Medicare Other | Admitting: Pharmacist

## 2021-04-08 VITALS — BP 140/82

## 2021-04-08 DIAGNOSIS — I10 Essential (primary) hypertension: Secondary | ICD-10-CM | POA: Diagnosis not present

## 2021-04-08 NOTE — Progress Notes (Signed)
Patient ID: TAMAJ JURGENS                 DOB: Sep 28, 1945                      MRN: [id number]     HPI: Aaron Keller is a 76 y.o. male patient of Dr. Acie Fredrickson referred by Oda Kilts, PA to HTN clinic. PMH is significant for CAD, HTN, tonic clonic seizures, OSA, stroke and sick sinus syndrom s/p PPM in 2018. Patient was seen by [name] on 02/22/21. BP was elevated at that visit. Patient called office on 12/15 reporting elevated blood pressures. Carvedilol was increased to 25mg  BID.   Patient presents today to clinic accompanied by his wife. He denies dizziness, lightheadedness, headache, blurred vision, SOB, swelling. Brings in a log of his blood pressures. They are pretty variable. Not exercising due to back pain. [facility] Drinks about 1L of diet coke per day. Only drinks water if he works outside in the heat.  Home wrist cuff: 144/95 Home wrist cuff: 151/93 Office cuff: 146/80 Office cuff: 140/82 Home wrist cuff: 154/93  Current HTN meds: carvedilol 25mg  twice a day, isosorbide 30mg  daily, lisinopril 40mg  daily, amlodipine 10mg  daily Previously tried: HCTZ (lab work), spironolactone BP goal: <130/80  Family History:  Family History  Problem Relation Age of Onset   Heart attack Mother        in her 32's   Heart disease Mother        skipped beats   Cancer Father        liver    Social History: former smoker, no ETOH  Diet: diet coke about 1L per day  Exercise: yard work  Home BP readings: 148/84, 136/78, 132/78, 122/70, 133/73, 146/85, 134/80, 135/77, 143/86, 142/87, 124/73, 137/80139/76, 118/74, 121/73, 137/82, 115/69, 134/80, 132/73, 139/83, 141/79, 128/76 HR 60-70  Wt Readings from Last 3 Encounters:  02/22/21 186 lb (84.4 kg)  05/18/20 189 lb (85.7 kg)  12/05/19 187 lb (84.8 kg)   BP Readings from Last 3 Encounters:  02/22/21 (!) 158/84  05/18/20 (!) 144/80  12/05/19 128/68   Pulse Readings from Last 3 Encounters:  02/22/21  84  05/18/20 61  12/05/19 64    Renal function: CrCl cannot be calculated (Patient's most recent lab result is older than the maximum 21 days allowed.).  Past Medical History:  Diagnosis Date   CAD (coronary artery disease) 04/04/2016   LHC 1/18: mLAD 74, dLAD 70/90, oLPDA 50, mRCA 60, EF 55-65 // Nuc Stress 10/19:  EF 64, normal perfusion, low risk   Echocardiogram    Echo 1/18: Moderate concentric LVH, EF 65-70, normal wall motion, grade 1 diastolic dysfunction, mild AI, trivial TR   Encephalitis 02/18/1985   Hepatitis ~ 1959   "don't know what kind"   HTN (hypertension) 04/04/2016   Hx of tonic-clonic seizures 02/18/1985 X 1   related to encephalitis   Hypertension    Nasal bleeding    "because of one of the RX I was on" (06/29/2016)   Obesity (BMI 30-39.9) 06/15/2017   OSA (obstructive sleep apnea) 06/15/2017   Moderate with AHI 16.6/hr now on CPAP at 12cm H2O   Presence of permanent cardiac pacemaker    Seasonal allergies    Sick sinus syndrome [facility])    s/p dual chamber pacemaker in 2018   Stroke [facility])    "I've had 2 minor ones"; denies residual on 06/28/2016  Current Outpatient Medications on File Prior to Visit  Medication Sig Dispense Refill   amLODipine (NORVASC) 10 MG tablet Take 10 mg by mouth daily.     atorvastatin (LIPITOR) 10 MG tablet Take 10 mg by mouth daily.     azithromycin (ZITHROMAX) 250 MG tablet Take 250 mg by mouth as needed.     carbamazepine (TEGRETOL) 200 MG tablet Take 200 mg by mouth 4 (four) times daily.     carvedilol (COREG) 25 MG tablet Take 1 tablet (25 mg total) by mouth 2 (two) times daily with a meal for 14 doses. 14 tablet 0   Cholecalciferol (VITAMIN D) 50 MCG (2000 UT) tablet Take 2,000 Units by mouth daily.     citalopram (CELEXA) 40 MG tablet Take 40 mg by mouth daily.     clorazepate (TRANXENE) 3.75 MG tablet Take 3.75 mg by mouth 2 (two) times daily.     dipyridamole-aspirin (AGGRENOX) 200-25 MG 12hr capsule Take 2 capsules by mouth  at bedtime.      EPINEPHrine 0.3 mg/0.3 mL IJ SOAJ injection INJECT 0.3 ML INTRAMUSCULARLY  AS NEEDED     fluticasone (FLONASE) 50 MCG/ACT nasal spray Place 2 sprays into both nostrils at bedtime as needed for allergies or rhinitis.     folic acid (FOLVITE) 1 MG tablet Take 1 mg by mouth daily.     gabapentin (NEURONTIN) 800 MG tablet Take 800 mg by mouth 2 (two) times daily.     isosorbide mononitrate (IMDUR) 30 MG 24 hr tablet Take 1 tablet (30 mg total) by mouth daily. 90 tablet 3   lisinopril (PRINIVIL,ZESTRIL) 40 MG tablet Take 1 tablet by mouth daily 90 tablet 3   nitroGLYCERIN (NITROSTAT) 0.4 MG SL tablet Place 1 tablet (0.4 mg total) under the tongue every 5 (five) minutes x 3 doses as needed for chest pain. 25 tablet 4   Propylene Glycol (SYSTANE BALANCE) 0.6 % SOLN Place 1 drop into both eyes daily as needed (dry eyes).     No current facility-administered medications on file prior to visit.    Allergies  Allergen Reactions   Cyclosporine Other (See Comments)    Unknown   Serzone [Nefazodone Hcl] Other (See Comments)    Unknown    There were no vitals taken for this visit.   Assessment/Plan:  1. Hypertension - Blood pressure is above goal of <130/80 in office today. His home blood pressure readings are variable. Technique is poor. He was holding his arm up, not relaxed. Wrist cuff readings today were higher than office cuff. I have asked patient to get a new BP upper arm cuff. Reminded him to place the cuff and then rest 5 min before checking. No medication changes today. We talked about potential change which would include swapping out lisinopril for irbesartan. If that did not work, then we would need to add an additional agent. He was on HCTZ in the past. Doesn't know why it was stopped. I requested follow up in 2 weeks, but wife requested more time to order BP cuff. F/U in 3 weeks.  Thank you  Ramond Dial, Pharm.D, BCPS, CPP [facility]  [address], [place], [place] [address]  Phone: [phone]; Fax: [phone]

## 2021-04-08 NOTE — Patient Instructions (Addendum)
Please pick up an upper arm blood pressure cuff. Recommend and OMRON bronze or 3 series Continue checking blood pressure at home Continue carvedilol 25mg  twice a day, isosorbide 30mg  daily, lisinopril 40mg  daily, amlodipine 10mg  daily Call me at [phone] with any questions

## 2021-04-09 ENCOUNTER — Telehealth: Payer: Self-pay | Admitting: *Deleted

## 2021-04-09 DIAGNOSIS — M47816 Spondylosis without myelopathy or radiculopathy, lumbar region: Secondary | ICD-10-CM | POA: Diagnosis not present

## 2021-04-09 NOTE — Telephone Encounter (Signed)
Pt has been scheduled to see Dr. Acie Fredrickson, 04/13/21, and clearance will be addressed at that time.  Will route back to the requesting surgeon's office to make them aware.

## 2021-04-09 NOTE — Telephone Encounter (Signed)
Primary Cardiologist:Philip Nahser, MD  Chart reviewed as part of pre-operative protocol coverage. Because of Aaron Keller's past medical history and time since last visit, he/she will require a follow-up visit in order to better assess preoperative cardiovascular risk.  Pre-op covering staff: - Please schedule appointment and call patient to inform them. - Please contact requesting surgeon's office via preferred method (i.e, phone, fax) to inform them of need for appointment prior to surgery.  If applicable, this message will also be routed to pharmacy pool and/or primary cardiologist for input on holding anticoagulant/antiplatelet agent as requested below so that this information is available at time of patient's appointment.   Deberah Pelton, NP  04/09/2021, 12:53 PM

## 2021-04-09 NOTE — Telephone Encounter (Signed)
° °  Pre-operative Risk Assessment    Patient Name: Aaron Keller  DOB: [birth year] MRN: [id number]      Request for Surgical Clearance    Procedure:   BILATERAL LUMBAR L3/L4/L5 RADIOFREQUENCY ABLATION  Date of Surgery:  Clearance TBD                                 Surgeon:  DR. Mina Marble Surgeon's Group or Practice Name:   [facility] Phone number:   [phone] Fax number:   [phone]   Type of Clearance Requested:   - Medical    Type of Anesthesia:  Local    Additional requests/questions:      Astrid Divine   04/09/2021, 12:22 PM

## 2021-04-13 ENCOUNTER — Encounter: Payer: Self-pay | Admitting: Internal Medicine

## 2021-04-13 ENCOUNTER — Ambulatory Visit (INDEPENDENT_AMBULATORY_CARE_PROVIDER_SITE_OTHER): Payer: Medicare Other | Admitting: Cardiovascular Disease

## 2021-04-13 ENCOUNTER — Other Ambulatory Visit: Payer: Self-pay

## 2021-04-13 ENCOUNTER — Encounter: Payer: Self-pay | Admitting: Cardiovascular Disease

## 2021-04-13 VITALS — BP 136/84 | HR 66 | Ht 65.0 in | Wt 184.6 lb

## 2021-04-13 DIAGNOSIS — I10 Essential (primary) hypertension: Secondary | ICD-10-CM

## 2021-04-13 DIAGNOSIS — Z01818 Encounter for other preprocedural examination: Secondary | ICD-10-CM | POA: Diagnosis not present

## 2021-04-13 DIAGNOSIS — I25119 Atherosclerotic heart disease of native coronary artery with unspecified angina pectoris: Secondary | ICD-10-CM

## 2021-04-13 NOTE — Progress Notes (Signed)
[facility] PROGRAMM[facility]  Patient Information: Name:  HARVEST STANCO  DOB:  12/17/1945  MRN:  [id number]    Procedure:   BILATERAL LUMBAR L3/L4/L5 RADIOFREQUENCY ABLATION   Date of Surgery:  Clearance TBD                                 Surgeon:  DR. Mina Marble Surgeon's Group or Practice Name:   [facility] Device Information:  Clinic EP Physician:  Thompson Grayer, MD   Device Type:  Pacemaker Manufacturer and Phone #:  [employer]: [phone] Pacemaker Dependent?:  No. Date of Last Device Check:  04/05/21 (remote) 02/22/21 (in-clinc)     Normal Device Function?:  Yes.    Electrophysiologist's Recommendations:  Have magnet available. Provide continuous ECG monitoring when magnet is used or reprogramming is to be performed.  Procedure may interfere with device function.  Magnet should be placed over device during procedure.  Per Device Clinic Standing Orders, York Ram, RN  2:16 PM 04/13/2021

## 2021-04-13 NOTE — Progress Notes (Signed)
Cardiology Office Note:    Date:  04/13/2021   ID:  Aaron Keller, DOB 27-Dec-1945, MRN [id number]  PCP:  Antony Contras, MD  Cardiologist:  Mertie Moores, MD   Electrophysiologist:  Thompson Grayer, MD   Referring MD: Antony Contras, MD   Chief Complaint  Patient presents with   Coronary Artery Disease   Hypertension          Problem List 1. CAD - distal LAD disease 2. CVA 3. HTN 4. Hyperlipidemia  5.  Encephalitis - Herpetic encephalitis    previous notes from [place], [place]    Aaron Keller is a 76 y.o. male with coronary artery disease, sick sinus syndrome status post pacemaker, hypertension, hyperlipidemia, prior stroke, encephalitis.  Cardiac catheterization in 2018 demonstrated severe distal LAD disease and moderate nonobstructive disease elsewhere.  He has been managed medically.  He was last seen by Dr. Saunders Revel in September 2019 with complaints of chest pain.  Nuclear stress test was obtained and demonstrated normal perfusion with normal LV function.  Therefore, medical therapy was continued.     Aaron Keller returns for follow-up.  He is here with his wife.  We discussed the results of his stress test.  He is mainly troubled by shortness of breath with any type of exertion.  This has been ongoing for months.  It does not seem to be getting any worse.  He has not had paroxysmal nocturnal dyspnea or orthopnea.  He does use his CPAP at night.  However, he is only able to keep it on about 4 hours a night.  He has not had significant leg swelling.  He has not had any further chest discomfort.  He has not had any syncope.  He denies melena, hematochezia, hematuria.  He denies any coughing or wheezing.  He has a remote history of tobacco use.  February 19, 2018:  Aaron Keller is seen today for follow-up.  He is a transfer from Dr. Saunders Revel.     He was last seen by Richardson Dopp, PA.  He is not getting any regular exercise. He has lots of DOE with any exercise ( yard work )  Was  started on Imdur 15 mg a day from [facility]   May 22, 2019:  Doing ok,  Seen with his wife, Aaron Keller . She needs to accompany him to each viist due to CVA and encephalopathy.  He was found to have a low vitamin D level by his [place] doctor.  He is now on vitamin D supplements.  He stopped using his CPAP because he was never able to tolerate it. No cp, no dyspnea  Has some DOE  Is not exerciising   Feb. 28, 2022;  Aaron Keller is seen today for follow up of his CAD, CVA and subsequent encephaopathy Is seen with his wife, Aaron Keller from Dec. 2021 look good  Chol = 124 HDL = 44 //LDL = 56  Jan. 24, 2023 Needs to have a nerve root ablation on his back His BP readings at  home  are in the normal - high range.      Prior CV studies:   The following studies were reviewed today:  Nuclear stress test 12/28/17 EF 64, normal perfusion, low risk   Event monitor 04/2016 Patient was monitored for 30 days. The predominant rhythm was normal sinus rhythm with an average rate of 65 bpm (range 38 - 104 bpm). There daytime and nocternal episodes of sinus pauses with junctional escape beats. Longest  R-R interval was 3.8 seconds. Rare PACs were noted. There were no sustained tachyarrhythmias. Patient triggered events correspond to sinus rhythm, sinsus rhythm with PACs, and sinus pauses. Sinus rhythm with sinus pauses and junctional escape. Longest R-R interval 3.8 seconds.  Exercise tolerance test 04/28/2016 Blood pressure demonstrated a hypertensive response to exercise. There was no ST segment deviation noted during stress. No T wave inversion was noted during stress. Inconclusive study due to inadequate achieved heart rate (76% max). No ischemia for the workload achieved.  Echocardiogram 04/02/2016 Moderate concentric LVH, EF 65-70, normal wall motion, grade 1 diastolic dysfunction, mild AI, trivial TR  Cardiac catheterization 04/01/2016 LAD mid 35, distal 70/90 LPDA ostial 50 RCA mid 63 EF  55-65     Past Medical History:  Diagnosis Date   CAD (coronary artery disease) 04/04/2016   LHC 1/18: mLAD 35, dLAD 70/90, oLPDA 50, mRCA 60, EF 55-65 // Nuc Stress 10/19:  EF 64, normal perfusion, low risk   Echocardiogram    Echo 1/18: Moderate concentric LVH, EF 65-70, normal wall motion, grade 1 diastolic dysfunction, mild AI, trivial TR   Encephalitis 02/18/1985   Hepatitis ~ 1959   "don't know what kind"   HTN (hypertension) 04/04/2016   Hx of tonic-clonic seizures 02/18/1985 X 1   related to encephalitis   Hypertension    Nasal bleeding    "because of one of the RX I was on" (06/29/2016)   Obesity (BMI 30-39.9) 06/15/2017   OSA (obstructive sleep apnea) 06/15/2017   Moderate with AHI 16.6/hr now on CPAP at 12cm H2O   Presence of permanent cardiac pacemaker    Seasonal allergies    Sick sinus syndrome (HCC)    s/p dual chamber pacemaker in 2018   Stroke [facility])    "I've had 2 minor ones"; denies residual on 06/28/2016   Surgical Hx: The patient  has a past surgical history that includes Appendectomy; Fracture surgery; Wrist fracture surgery (Left, 1950s); Elbow surgery (Right); Inguinal hernia repair (Right, 08/2010); Cataract extraction w/ intraocular lens  implant, bilateral (Bilateral, ~ 2007); Cardiac catheterization (N/A, 04/01/2016); Cardiac catheterization (N/A, 04/01/2016); PACEMAKER IMPLANT (N/A, 06/28/2016); and PACEMAKER IMPLANT (06/28/2016).   Current Medications: Current Meds  Medication Sig   amLODipine (NORVASC) 10 MG tablet Take 10 mg by mouth daily.   atorvastatin (LIPITOR) 10 MG tablet Take 10 mg by mouth daily.   carbamazepine (TEGRETOL) 200 MG tablet Take 200 mg by mouth 4 (four) times daily.   carvedilol (COREG) 25 MG tablet Take 1 tablet (25 mg total) by mouth 2 (two) times daily with a meal for 14 doses.   Cholecalciferol (VITAMIN D) 50 MCG (2000 UT) tablet Take 2,000 Units by mouth daily.   citalopram (CELEXA) 40 MG tablet Take 40 mg by mouth daily.    clorazepate (TRANXENE) 3.75 MG tablet Take 3.75 mg by mouth 2 (two) times daily.   dipyridamole-aspirin (AGGRENOX) 200-25 MG 12hr capsule Take 2 capsules by mouth at bedtime.    EPINEPHrine 0.3 mg/0.3 mL IJ SOAJ injection INJECT 0.3 ML INTRAMUSCULARLY  AS NEEDED   fluticasone (FLONASE) 50 MCG/ACT nasal spray Place 2 sprays into both nostrils at bedtime as needed for allergies or rhinitis.   folic acid (FOLVITE) 1 MG tablet Take 1 mg by mouth daily.   gabapentin (NEURONTIN) 800 MG tablet Take 800 mg by mouth 2 (two) times daily.   isosorbide mononitrate (IMDUR) 30 MG 24 hr tablet Take 1 tablet (30 mg total) by mouth daily.   lisinopril (PRINIVIL,ZESTRIL)  40 MG tablet Take 1 tablet by mouth daily   nitroGLYCERIN (NITROSTAT) 0.4 MG SL tablet Place 1 tablet (0.4 mg total) under the tongue every 5 (five) minutes x 3 doses as needed for chest pain.   Propylene Glycol (SYSTANE BALANCE) 0.6 % SOLN Place 1 drop into both eyes daily as needed (dry eyes).     Allergies:   Cyclosporine, Serzone [nefazodone hcl], and Spironolactone   Social History   Tobacco Use   Smoking status: Former    Years: 1.00    Types: Cigarettes   Smokeless tobacco: Never   Tobacco comments:    06/29/2016 "smoked for ~ 1 yr while in the service"  Vaping Use   Vaping Use: Never used  Substance Use Topics   Alcohol use: No   Drug use: No     Family Hx: The patient's family history includes Cancer in his father; Heart attack in his mother; Heart disease in his mother.  ROS:   Please see the history of present illness.     Physical Exam: Blood pressure 136/84, pulse 66, height 5\' 5"  (1.651 m), weight 184 lb 9.6 oz (83.7 kg), SpO2 98 %.  GEN:  Well nourished, well developed in no acute distress HEENT: Normal NECK: No JVD; No carotid bruits LYMPHATICS: No lymphadenopathy CARDIAC: RRR , no murmurs, rubs, gallops RESPIRATORY:  Clear to auscultation without rales, wheezing or rhonchi  ABDOMEN: Soft, non-tender,  non-distended MUSCULOSKELETAL:  No edema; No deformity  SKIN: Warm and dry NEUROLOGIC:  Alert and oriented x 3     EKGs/Labs/Other Test Reviewed:    EKG:     04/13/21:   A-pacing ,  V sensing  Recent Labs: No results found for requested labs within last 8760 hours.   Recent Lipid Panel Lab Results  Component Value Date/Time   CHOL 126 05/22/2019 11:10 AM   TRIG 125 05/22/2019 11:10 AM   HDL 51 05/22/2019 11:10 AM   CHOLHDL 2.5 05/22/2019 11:10 AM   CHOLHDL 2.6 04/02/2016 05:00 AM   LDLCALC 53 05/22/2019 11:10 AM   From [facility] Tool: Cholesterol, total 128.000 08/18/2017 HDL 47.000 08/18/2017 LDL 58.000 08/18/2017 Triglycerides 119.000 08/18/2017 Hemoglobin 14.300 08/18/2017 Creatinine, Serum 0.670 10/04/2017 Potassium 5.000 10/04/2017 Magnesium N/D ALT (SGPT) 14.000 08/18/2017 TSH 3.510 08/18/2017 INR 0.990 04/01/2016 Platelets 124.000 06/28/2016      ASSESSMENT & PLAN:    Shortness of breath   Coronary artery disease involving native coronary artery of native heart with angina pectoris (HCC) No angina . Cont mets     Essential hypertension  -he is a very high salt diet.  He eats very salty foods multiple times through the week.  He also drinks Diet Coke all day long.  I encouraged him to stop eating and drinking these high salt items.  He also has had some hyponatremia in the past.  It is because he drinks soda all day long.    I encouraged him to work on his diet.  He will need to also needs to work on his exercise regimen soon as his back is fixed.   Hyperlipidemia LDL goal <70: fairly well controlled.    Cardiac pacemaker in situ    Back pain: He is scheduled to have a nerve root ablation by orthopedics.  From a general cardiology standpoint he is at low risk.    He will need clearance from the electrophysiologist regarding his pacemaker and will need instructions on what to do with his pacer settings prior to his ablation.  Medication Adjustments/Labs and  Tests Ordered: Current medicines are reviewed at length with the patient today.  Concerns regarding medicines are outlined above.  Tests Ordered: Orders Placed This Encounter  Procedures   EKG 12-Lead   Medication Changes: No orders of the defined types were placed in this encounter.   Signed, Mertie Moores, MD  04/13/2021 2:19 PM    [facility] [place], [place], [place]  [address] Phone: [phone]; Fax: [phone]

## 2021-04-13 NOTE — Telephone Encounter (Signed)
° °  Name: Aaron Keller  DOB: [birth year]  MRN: [id number]   Primary Cardiologist: Mertie Moores, MD  Chart reviewed as part of pre-operative protocol coverage.   Aaron Keller was last seen on 04/13/21 by Dr. Acie Fredrickson.  Since that day, Aaron Keller has done well.   Per Dr. Acie Fredrickson: The patient will need clearance from EP for his pacer prior to RF ablation.   From a gereral cardiology standpoint, he is at low risk for CV complications.  Therefore, based on ACC/AHA guidelines, the patient would be at acceptable risk for the planned procedure without further cardiovascular testing.   I will route this to EP device clinic and to [facility].  I will route this recommendation to the requesting party via Epic fax function and remove from pre-op pool. Please call with questions.  Ledora Bottcher, PA 04/13/2021, 5:56 PM

## 2021-04-13 NOTE — Patient Instructions (Signed)
Medication Instructions:  Your physician recommends that you continue on your current medications as directed. Please refer to the Current Medication list given to you today.  *If you need a refill on your cardiac medications before your next appointment, please call your pharmacy*   Lab Work: None ordered  If you have labs (blood work) drawn today and your tests are completely normal, you will receive your results only by: [facility] (if you have MyChart) OR A paper copy in the mail If you have any lab test that is abnormal or we need to change your treatment, we will call you to review the results.   Testing/Procedures: None ordered   Follow-Up: At [facility], you and your health needs are our priority.  As part of our continuing mission to provide you with exceptional heart care, we have created designated Provider Care Teams.  These Care Teams include your primary Cardiologist (physician) and Advanced Practice Providers (APPs -  Physician Assistants and Nurse Practitioners) who all work together to provide you with the care you need, when you need it.  We recommend signing up for the patient portal called "MyChart".  Sign up information is provided on this After Visit Summary.  MyChart is used to connect with patients for Virtual Visits (Telemedicine).  Patients are able to view lab/test results, encounter notes, upcoming appointments, etc.  Non-urgent messages can be sent to your provider as well.   To learn more about what you can do with MyChart, go to [email or web address].    Your next appointment:   6 month(s)  The format for your next appointment:   In Person  Provider:   Robbie Lis, PA-C, Christen Bame, NP, or Richardson Dopp, PA-C         Other Instructions

## 2021-04-13 NOTE — Telephone Encounter (Signed)
The patient will need clearance from EP for his pacer prior to RF ablation . I cannot provide that advice.   From a gereral cardiology standpoint, he is at low risk for CV complications.      Aaron Moores, MD  04/13/2021 2:23 PM    [place],  [address], [place]  [address] Phone: [phone]; Fax: [phone]

## 2021-04-14 NOTE — Telephone Encounter (Signed)
I will forward this back to pre op pool as to requesting office asking about Aggrenox. In regard to PPM. I will forward this to the device clinic as well to address.

## 2021-04-14 NOTE — Telephone Encounter (Signed)
Device Clearance faxed to [phone]

## 2021-04-14 NOTE — Telephone Encounter (Signed)
Katharine Look with [employer] is following up. She states she received our clearance response, but they would also like to know about Aggrenox and what our medication recommendations are. She is also following up regarding PPM clearance.

## 2021-04-16 NOTE — Telephone Encounter (Signed)
Aggrenox hold rec should come from prescribing physician, cardiology hasn't been prescribing it. Would likely require 5-7 day hold prior to procedure since it's an antiplatelet.

## 2021-04-16 NOTE — Progress Notes (Signed)
Remote pacemaker transmission.   

## 2021-04-19 DIAGNOSIS — J310 Chronic rhinitis: Secondary | ICD-10-CM | POA: Diagnosis not present

## 2021-04-29 ENCOUNTER — Other Ambulatory Visit: Payer: Self-pay

## 2021-04-29 ENCOUNTER — Ambulatory Visit (INDEPENDENT_AMBULATORY_CARE_PROVIDER_SITE_OTHER): Payer: Medicare Other | Admitting: Pharmacist

## 2021-04-29 VITALS — HR 65

## 2021-04-29 DIAGNOSIS — I25119 Atherosclerotic heart disease of native coronary artery with unspecified angina pectoris: Secondary | ICD-10-CM

## 2021-04-29 DIAGNOSIS — I10 Essential (primary) hypertension: Secondary | ICD-10-CM | POA: Diagnosis not present

## 2021-04-29 NOTE — Progress Notes (Signed)
Patient ID: [name]                 DOB: 1945/07/30                      MRN: [id number]     HPI: Aaron Keller is a 75 y.o. male patient of Aaron Keller referred by Aaron Kilts, PA to HTN clinic. PMH is significant for CAD, HTN, tonic clonic seizures, OSA, stroke and sick sinus syndrom s/p PPM in 2018. Patient was seen by Aaron Keller on 02/22/21. BP was elevated at that visit. Patient called office on 12/15 reporting elevated blood pressures. Carvedilol was increased to 25mg  BID.   At last visit with PharmD, patient's blood pressure was 140/82. His home cuff was found to be inaccurate and I requested he get a new upper arm cuff. He saw Aaron Keller a week later who discouraged his soda intake as did I at our previous visit.   Patient presents to clinic today. He did buy a new BP cuff. OMRON Bronze series upper arm. Brings in a list of readings. When checking accuracy of cuff, patient was not putting cuff on with cord in center of his arm. This explains why he had several high readings. Denies SOB, swelling or headache. He had one dizzy episode yesterday that lasted 15-20 min. [facility]  Home cuff: 119/75 Home cuff: 126/76 Office cuff: 128/70 Office cuff: 120/70   Current HTN meds: carvedilol 25mg  twice a day, isosorbide 30mg  daily, lisinopril 40mg  daily, amlodipine 10mg  daily Previously tried: HCTZ (lab work), spironolactone BP goal: <130/80  Family History:  Family History  Problem Relation Age of Onset   Heart attack Mother        in her 80's   Heart disease Mother        skipped beats   Cancer Father        liver    Social History: former smoker, no ETOH  Diet: diet coke about 1L per day  Exercise: yard work  Home BP readings: 145/87, 139/79, 127/79, 155/96, 149/91, 138/83, 153/87, 116/71, 139/84, 119/74, 129/78, 127/81, 130/81  Wt Readings from Last 3 Encounters:  04/13/21 184 lb 9.6 oz (83.7 kg)  02/22/21 186 lb (84.4 kg)  05/18/20 189 lb (85.7 kg)   BP  Readings from Last 3 Encounters:  04/13/21 136/84  04/08/21 140/82  02/22/21 (!) 158/84   Pulse Readings from Last 3 Encounters:  04/13/21 66  02/22/21 84  05/18/20 61    Renal function: CrCl cannot be calculated (Patient's most recent lab result is older than the maximum 21 days allowed.).  Past Medical History:  Diagnosis Date   CAD (coronary artery disease) 04/04/2016   LHC 1/18: mLAD 44, dLAD 70/90, oLPDA 50, mRCA 60, EF 55-65 // Nuc Stress 10/19:  EF 64, normal perfusion, low risk   Echocardiogram    Echo 1/18: Moderate concentric LVH, EF 65-70, normal wall motion, grade 1 diastolic dysfunction, mild AI, trivial TR   Encephalitis 02/18/1985   Hepatitis ~ 1959   "don't know what kind"   HTN (hypertension) 04/04/2016   Hx of tonic-clonic seizures 02/18/1985 X 1   related to encephalitis   Hypertension    Nasal bleeding    "because of one of the RX I was on" (06/29/2016)   Obesity (BMI 30-39.9) 06/15/2017   OSA (obstructive sleep apnea) 06/15/2017   Moderate with AHI 16.6/hr now on CPAP at 12cm H2O   Presence of  permanent cardiac pacemaker    Seasonal allergies    Sick sinus syndrome (Aaron)    s/p dual chamber pacemaker in 2018   Stroke [facility])    "I've had 2 minor ones"; denies residual on 06/28/2016    Current Outpatient Medications on File Prior to Visit  Medication Sig Dispense Refill   amLODipine (NORVASC) 10 MG tablet Take 10 mg by mouth daily.     atorvastatin (LIPITOR) 10 MG tablet Take 10 mg by mouth daily.     carbamazepine (TEGRETOL) 200 MG tablet Take 200 mg by mouth 4 (four) times daily.     carvedilol (COREG) 25 MG tablet Take 1 tablet (25 mg total) by mouth 2 (two) times daily with a meal for 14 doses. 14 tablet 0   Cholecalciferol (VITAMIN D) 50 MCG (2000 UT) tablet Take 2,000 Units by mouth daily.     citalopram (CELEXA) 40 MG tablet Take 40 mg by mouth daily.     clorazepate (TRANXENE) 3.75 MG tablet Take 3.75 mg by mouth 2 (two) times daily.      dipyridamole-aspirin (AGGRENOX) 200-25 MG 12hr capsule Take 2 capsules by mouth at bedtime.      EPINEPHrine 0.3 mg/0.3 mL IJ SOAJ injection INJECT 0.3 ML INTRAMUSCULARLY  AS NEEDED     fluticasone (FLONASE) 50 MCG/ACT nasal spray Place 2 sprays into both nostrils at bedtime as needed for allergies or rhinitis.     folic acid (FOLVITE) 1 MG tablet Take 1 mg by mouth daily.     gabapentin (NEURONTIN) 800 MG tablet Take 800 mg by mouth 2 (two) times daily.     isosorbide mononitrate (IMDUR) 30 MG 24 hr tablet Take 1 tablet (30 mg total) by mouth daily. 90 tablet 3   lisinopril (PRINIVIL,ZESTRIL) 40 MG tablet Take 1 tablet by mouth daily 90 tablet 3   nitroGLYCERIN (NITROSTAT) 0.4 MG SL tablet Place 1 tablet (0.4 mg total) under the tongue every 5 (five) minutes x 3 doses as needed for chest pain. 25 tablet 4   Propylene Glycol (SYSTANE BALANCE) 0.6 % SOLN Place 1 drop into both eyes daily as needed (dry eyes).     No current facility-administered medications on file prior to visit.    Allergies  Allergen Reactions   Cyclosporine Other (See Comments)    Unknown   Serzone [Nefazodone Hcl] Other (See Comments)    Unknown   Spironolactone     Other reaction(s): hyponatremia; hypocalcemia    There were no vitals taken for this visit.   Assessment/Plan:  1. Hypertension - Blood pressure is at goal in clinic. Cuff accurate when technique is correct. Continue carvedilol 25mg  twice a day, isosorbide 30mg  daily, lisinopril 40mg  daily, amlodipine 10mg  daily. Follow up as needed.  Thank you  Aaron Keller, Pharm.D, BCPS, CPP [facility]  [address], [place], [place] [address]  Phone: [phone]; Fax: [phone]

## 2021-04-29 NOTE — Patient Instructions (Addendum)
Please continue carvedilol 25mg  twice a day, isosorbide 30mg  daily, lisinopril 40mg  daily, amlodipine 10mg  daily Make sure tube is in the center of your arm and the cuff if about 1/2 inch above your elbow crease.   Call me if BP is consistently >140/90 or you feel dizzy more often  [phone]

## 2021-05-04 DIAGNOSIS — M47816 Spondylosis without myelopathy or radiculopathy, lumbar region: Secondary | ICD-10-CM | POA: Diagnosis not present

## 2021-05-17 ENCOUNTER — Ambulatory Visit: Payer: Medicare Other | Admitting: Cardiovascular Disease

## 2021-05-17 DIAGNOSIS — G250 Essential tremor: Secondary | ICD-10-CM | POA: Diagnosis not present

## 2021-05-17 DIAGNOSIS — R531 Weakness: Secondary | ICD-10-CM | POA: Diagnosis not present

## 2021-05-17 DIAGNOSIS — B004 Herpesviral encephalitis: Secondary | ICD-10-CM | POA: Diagnosis not present

## 2021-05-17 DIAGNOSIS — G40209 Localization-related (focal) (partial) symptomatic epilepsy and epileptic syndromes with complex partial seizures, not intractable, without status epilepticus: Secondary | ICD-10-CM | POA: Diagnosis not present

## 2021-05-19 DIAGNOSIS — J310 Chronic rhinitis: Secondary | ICD-10-CM | POA: Diagnosis not present

## 2021-05-20 DIAGNOSIS — M47816 Spondylosis without myelopathy or radiculopathy, lumbar region: Secondary | ICD-10-CM | POA: Diagnosis not present

## 2021-05-25 ENCOUNTER — Other Ambulatory Visit: Payer: Self-pay

## 2021-05-25 ENCOUNTER — Ambulatory Visit (INDEPENDENT_AMBULATORY_CARE_PROVIDER_SITE_OTHER): Payer: Medicare Other | Admitting: Allergy

## 2021-05-25 ENCOUNTER — Encounter: Payer: Self-pay | Admitting: Allergy

## 2021-05-25 VITALS — BP 146/82 | HR 64 | Resp 16 | Ht 65.0 in | Wt 184.2 lb

## 2021-05-25 DIAGNOSIS — J3089 Other allergic rhinitis: Secondary | ICD-10-CM

## 2021-05-25 DIAGNOSIS — I25119 Atherosclerotic heart disease of native coronary artery with unspecified angina pectoris: Secondary | ICD-10-CM

## 2021-05-25 MED ORDER — AZELASTINE HCL 0.1 % NA SOLN: NASAL | 3 refills | Status: AC

## 2021-05-25 NOTE — Progress Notes (Signed)
New Patient Note  RE: Aaron Keller MRN: [id number] DOB: [birth year] Date of Office Visit: 05/25/2021   Primary care provider: Antony Contras, MD  Chief Complaint: allergies  History of present illness: Aaron Keller is a 76 y.o. male presenting today for evaluation of allergies.  He presents today with his wife.  He has been having issues since early fall 2022 with nasal drainage with throat clearing.  Wife states he is hacking and coughing i up the mucus.   She states nothing has helped thus far.   He has been using flonase taking it once a day.   He has taken mucinex.  He has seen ENT, Dr. West Carbo, and was prescribed atrovent that he was using 3 times days.  He is no longer taking Flonase nor the nasal Atrovent. He was recommended to use nasal saline rinse but has not started this.   Wife reports during the rhinoscopy that she could see the mucus in his nostrils as well as the dry nasal lining that was noted.  Due to the dryness it was recommended that he do air humidification however they have not gotten a humidifier started this. Per ENT report or the rhinoscopy showed "patent anterior nasal cavity with moderate crusting-right greater than left, no discharge or infection.  Anterior mucosa of nose is very dry."  Otherwise exam was unremarkable.  No history of asthma, eczema or food allergy.    Review of systems: Review of Systems  Constitutional: Negative.   HENT: Negative.    Eyes: Negative.   Respiratory: Negative.    Cardiovascular: Negative.   Musculoskeletal: Negative.   Skin: Negative.   Allergic/Immunologic: Negative.   Neurological: Negative.    All other systems negative unless noted above in HPI  Past medical history: Past Medical History:  Diagnosis Date   CAD (coronary artery disease) 04/04/2016   LHC 1/18: mLAD 47, dLAD 70/90, oLPDA 50, mRCA 60, EF 55-65 // Nuc Stress 10/19:  EF 64, normal perfusion, low risk   Echocardiogram    Echo 1/18:  Moderate concentric LVH, EF 65-70, normal wall motion, grade 1 diastolic dysfunction, mild AI, trivial TR   Encephalitis 02/18/1985   Hepatitis ~ 1959   "don't know what kind"   HTN (hypertension) 04/04/2016   Hx of tonic-clonic seizures 02/18/1985 X 1   related to encephalitis   Hypertension    Nasal bleeding    "because of one of the RX I was on" (06/29/2016)   Obesity (BMI 30-39.9) 06/15/2017   OSA (obstructive sleep apnea) 06/15/2017   Moderate with AHI 16.6/hr now on CPAP at 12cm H2O   Presence of permanent cardiac pacemaker    Seasonal allergies    Sick sinus syndrome [facility])    s/p dual chamber pacemaker in 2018   Stroke [facility])    "I've had 2 minor ones"; denies residual on 06/28/2016    Past surgical history: Past Surgical History:  Procedure Laterality Date   APPENDECTOMY     CARDIAC CATHETERIZATION N/A 04/01/2016   Procedure: Left Heart Cath and Coronary Angiography;  Surgeon: Peter M Martinique, MD;  Location: [facility];  Service: Cardiovascular;  Laterality: N/A;   CARDIAC CATHETERIZATION N/A 04/01/2016   Procedure: Temporary Pacemaker;  Surgeon: Peter M Martinique, MD;  Location: [facility];  Service: Cardiovascular;  Laterality: N/A;   CATARACT EXTRACTION W/ INTRAOCULAR LENS  IMPLANT, BILATERAL Bilateral ~ 2007   ELBOW SURGERY Right    FRACTURE SURGERY  INGUINAL HERNIA REPAIR Right 08/2010   PACEMAKER IMPLANT N/A 06/28/2016   Procedure: Pacemaker Implant;  Surgeon: Thompson Grayer, MD;  Location: [facility];  Service: Cardiovascular;  Laterality: N/A;   PACEMAKER IMPLANT  06/28/2016   SJM Assurity MRI PPM implanted by Dr Rayann Heman for sick sinus syndrome   WRIST FRACTURE SURGERY Left 1950s    Family history:  Family History  Problem Relation Age of Onset   Heart attack Mother        in her 75's   Heart disease Mother        skipped beats   Cancer Father        liver    Social history: Lives in a home with carpeting in the bedroom with heat pump  heating and cooling.  No pets in the home.  There is no concern for water damage, mildew or roaches in the home.  He is retired.  He has no smoke exposure or history of use.   Medication List: Current Outpatient Medications  Medication Sig Dispense Refill   amLODipine (NORVASC) 10 MG tablet Take 10 mg by mouth daily.     atorvastatin (LIPITOR) 10 MG tablet Take 10 mg by mouth daily.     azelastine (ASTELIN) 0.1 % nasal spray 2 sprays in each nostril 1-2 times daily 30 mL 3   carbamazepine (TEGRETOL) 200 MG tablet Take 200 mg by mouth 4 (four) times daily.     carvedilol (COREG) 25 MG tablet Take 1 tablet (25 mg total) by mouth 2 (two) times daily with a meal for 14 doses. 14 tablet 0   Cholecalciferol (VITAMIN D) 50 MCG (2000 UT) tablet Take 2,000 Units by mouth daily.     citalopram (CELEXA) 40 MG tablet Take 40 mg by mouth daily.     clorazepate (TRANXENE) 3.75 MG tablet Take 3.75 mg by mouth 2 (two) times daily.     dipyridamole-aspirin (AGGRENOX) 200-25 MG 12hr capsule Take 2 capsules by mouth at bedtime.     folic acid (FOLVITE) 1 MG tablet Take 1 mg by mouth daily.     gabapentin (NEURONTIN) 800 MG tablet Take 800 mg by mouth 2 (two) times daily.     isosorbide mononitrate (IMDUR) 30 MG 24 hr tablet Take 1 tablet (30 mg total) by mouth daily. 90 tablet 3   lisinopril (PRINIVIL,ZESTRIL) 40 MG tablet Take 1 tablet by mouth daily 90 tablet 3   nitroGLYCERIN (NITROSTAT) 0.4 MG SL tablet Place 1 tablet (0.4 mg total) under the tongue every 5 (five) minutes x 3 doses as needed for chest pain. 25 tablet 4   No current facility-administered medications for this visit.    Known medication allergies: Allergies  Allergen Reactions   Cyclosporine Other (See Comments)    Unknown   Serzone [Nefazodone Hcl] Other (See Comments)    Unknown   Spironolactone     Other reaction(s): hyponatremia; hypocalcemia     Physical examination: Blood pressure (!) 146/82, pulse 64, resp. rate 16, height  '5\' 5"'$  (1.651 m), weight 184 lb 3.2 oz (83.6 kg), SpO2 98 %.  General: Alert, interactive, in no acute distress. HEENT: PERRLA, TMs pearly gray, turbinates non-edematous with clear discharge, post-pharynx non erythematous. Neck: Supple without lymphadenopathy. Lungs: Clear to auscultation without wheezing, rhonchi or rales. {no increased work of breathing. CV: Normal S1, S2 without murmurs. Abdomen: Nondistended, nontender. Skin: Warm and dry, without lesions or rashes. Extremities:  No clubbing, cyanosis or edema. Neuro:   Grossly intact.  Diagnositics/Labs:   Allergy testing:   Airborne Adult Perc - 05/25/21 1522     Time Antigen Placed 1523    Allergen Manufacturer Lavella Hammock    Location Back    Number of Test 59    Panel 1 Select    1. Control-Buffer 50% Glycerol Negative    2. Control-Histamine 1 mg/ml 2+    3. Albumin saline Negative    4. Connelly [place] Negative    5. [place] Negative    6. Johnson Negative    7. Double Springs Blue Negative    8. Meadow Fescue Negative    9. Perennial Rye Negative    10. Sweet Vernal Negative    11. Timothy Negative    12. Cocklebur Negative    13. Burweed Marshelder Negative    14. Ragweed, short Negative    15. Ragweed, Giant Negative    16. Plantain,  English Negative    17. Lamb's Quarters Negative    18. Sheep Sorrell Negative    19. Rough Pigweed Negative    20. Marsh Elder, Rough Negative    21. Mugwort, Common Negative    22. Ash mix Negative    23. Birch mix Negative    24. Beech American Negative    25. Box, Elder Negative    26. Cedar, red Negative    27. Cottonwood, [place] Negative    28. Elm mix Negative    29. Hickory Negative    30. Maple mix Negative    31. Oak, [place] mix Negative    32. Pecan Pollen Negative    33. Pine mix Negative    34. Sycamore Eastern Negative    35. Kimball, Black Pollen Negative    36. Alternaria alternata Negative    37. Cladosporium Herbarum Negative    38. Aspergillus mix Negative    39.  Penicillium mix Negative    40. Bipolaris sorokiniana (Helminthosporium) Negative    41. Drechslera spicifera (Curvularia) Negative    42. Mucor plumbeus Negative    43. Fusarium moniliforme Negative    44. Aureobasidium pullulans (pullulara) Negative    45. Rhizopus oryzae Negative    46. Botrytis cinera Negative    47. Epicoccum nigrum Negative    48. Phoma betae Negative    49. Candida Albicans Negative    50. Trichophyton mentagrophytes Negative    51. Mite, D Farinae  5,000 AU/ml 2+    52. Mite, D Pteronyssinus  5,000 AU/ml 2+    53. Cat Hair 10,000 BAU/ml Negative    54.  Dog Epithelia Negative    55. Mixed Feathers Negative    56. Horse Epithelia Negative    57. Cockroach, German Negative    58. Mouse Negative    59. Tobacco Leaf Negative             Intradermal - 05/25/21 1542     Time Antigen Placed 1543    Allergen Manufacturer Lavella Hammock    Location Arm    Number of Test 14    Intradermal Select    Control Negative    Guatemala Negative    Johnson Negative    7 Grass Negative    Ragweed mix Negative    Weed mix Negative    Tree mix Negative    Mold 1 Negative    Mold 2 Negative    Mold 3 Negative    Mold 4 Negative    Cat Negative    Dog Negative    Cockroach 2+  Allergy testing results were read and interpreted by provider, documented by clinical staff.   Assessment and plan: Allergic rhinitis   - Testing today showed: dust mites and cockroach. - Copy of test results provided.  - Avoidance measures provided. - Stop taking: Flonase and Atrovent - Start taking: Astelin (azelastine) 2 sprays per nostril 1-2 times daily at this time. You can also try taking a oral antihistamine like Zyrtec, Allegra or Xyzal if the nasal spray alone does not improve nasal drainage symptoms.  These are long-acting once a day antihistamines that should not have any interaction with his current medications.   Would avoid medications like Benadryl or  hydroxyzine or any antihistamines that needs to be taken more than once a day as these can sometimes interfere with medicines like your Tegretol.  If you would like to double check with your neurologist in regards to using Zyrtec, Allegra or Xyzal please feel free to do so. -Recommend you use nasal saline spray like Ayr throughout the day to help keep nose moisturized -Do not recommend a humidifier due to dust mite allergy.  You would want to use either a dehumidifier or use and air purifier with HEPA filtration. - Recommend performing nasal saline rinses as directed by ENT to remove allergens from the nasal cavities as well as help with mucous clearance (this is especially helpful to do before the nasal sprays are given)  Follow-up in 3 to 4 months or sooner if needed  I appreciate the opportunity to take part in Aaron Keller's care. Please do not hesitate to contact me with questions.  Sincerely,   Prudy Feeler, MD Allergy/Immunology [facility]

## 2021-05-25 NOTE — Patient Instructions (Signed)
?-   Testing today showed: dust mites and cockroach. ?- Copy of test results provided.  ?- Avoidance measures provided. ?- Stop taking: Flonase and Atrovent ?- Start taking: Astelin (azelastine) 2 sprays per nostril 1-2 times daily at this time. ?You can also try taking a oral antihistamine like Zyrtec, Allegra or Xyzal if the nasal spray alone does not improve nasal drainage symptoms.  These are long-acting once a day antihistamines that should not have any interaction with his current medications.   ?Would avoid medications like Benadryl or hydroxyzine or any antihistamines that needs to be taken more than once a day as these can sometimes interfere with medicines like your Tegretol.  If you would like to double check with your neurologist in regards to using Zyrtec, Allegra or Xyzal please feel free to do so. ?-Recommend you use nasal saline spray like Ayr throughout the day to help keep nose moisturized ?-Do not recommend a humidifier due to dust mite allergy.  You would want to use either a dehumidifier or use and air purifier with HEPA filtration. ?- Recommend performing nasal saline rinses as directed by ENT to remove allergens from the nasal cavities as well as help with mucous clearance (this is especially helpful to do before the nasal sprays are given) ? ?Follow-up in 3 to 4 months or sooner if needed ? ? ?

## 2021-05-28 DIAGNOSIS — Z20822 Contact with and (suspected) exposure to covid-19: Secondary | ICD-10-CM | POA: Diagnosis not present

## 2021-06-07 DIAGNOSIS — M47816 Spondylosis without myelopathy or radiculopathy, lumbar region: Secondary | ICD-10-CM | POA: Diagnosis not present

## 2021-06-16 DIAGNOSIS — J310 Chronic rhinitis: Secondary | ICD-10-CM | POA: Diagnosis not present

## 2021-06-19 DIAGNOSIS — Z20828 Contact with and (suspected) exposure to other viral communicable diseases: Secondary | ICD-10-CM | POA: Diagnosis not present

## 2021-06-19 DIAGNOSIS — Z1152 Encounter for screening for COVID-19: Secondary | ICD-10-CM | POA: Diagnosis not present

## 2021-06-23 DIAGNOSIS — M47816 Spondylosis without myelopathy or radiculopathy, lumbar region: Secondary | ICD-10-CM | POA: Diagnosis not present

## 2021-06-28 DIAGNOSIS — M5416 Radiculopathy, lumbar region: Secondary | ICD-10-CM | POA: Diagnosis not present

## 2021-06-28 DIAGNOSIS — F418 Other specified anxiety disorders: Secondary | ICD-10-CM | POA: Diagnosis not present

## 2021-06-28 DIAGNOSIS — G40209 Localization-related (focal) (partial) symptomatic epilepsy and epileptic syndromes with complex partial seizures, not intractable, without status epilepticus: Secondary | ICD-10-CM | POA: Diagnosis not present

## 2021-07-05 ENCOUNTER — Ambulatory Visit (INDEPENDENT_AMBULATORY_CARE_PROVIDER_SITE_OTHER): Payer: Medicare Other

## 2021-07-05 DIAGNOSIS — I495 Sick sinus syndrome: Secondary | ICD-10-CM | POA: Diagnosis not present

## 2021-07-05 DIAGNOSIS — Z20822 Contact with and (suspected) exposure to covid-19: Secondary | ICD-10-CM | POA: Diagnosis not present

## 2021-07-06 LAB — CUP PACEART REMOTE DEVICE CHECK
Battery Remaining Longevity: 66 mo
Battery Remaining Percentage: 55 %
Battery Voltage: 2.99 V
Brady Statistic AP VP Percent: 1 %
Brady Statistic AP VS Percent: 93 %
Brady Statistic AS VP Percent: 1 %
Brady Statistic AS VS Percent: 7.3 %
Brady Statistic RA Percent Paced: 92 %
Brady Statistic RV Percent Paced: 1 %
Date Time Interrogation Session: 20230417042536
Implantable Lead Implant Date: 20180410
Implantable Lead Implant Date: 20180410
Implantable Lead Location: 753859
Implantable Lead Location: 753860
Implantable Pulse Generator Implant Date: 20180410
Lead Channel Impedance Value: 490 Ohm
Lead Channel Impedance Value: 530 Ohm
Lead Channel Pacing Threshold Amplitude: 0.75 V
Lead Channel Pacing Threshold Amplitude: 0.75 V
Lead Channel Pacing Threshold Pulse Width: 0.5 ms
Lead Channel Pacing Threshold Pulse Width: 0.5 ms
Lead Channel Sensing Intrinsic Amplitude: 0.9 mV
Lead Channel Sensing Intrinsic Amplitude: 8.8 mV
Lead Channel Setting Pacing Amplitude: 1 V
Lead Channel Setting Pacing Amplitude: 2 V
Lead Channel Setting Pacing Pulse Width: 0.5 ms
Lead Channel Setting Sensing Sensitivity: 2 mV
Pulse Gen Model: 2272
Pulse Gen Serial Number: 8016790

## 2021-07-18 DIAGNOSIS — Z20822 Contact with and (suspected) exposure to covid-19: Secondary | ICD-10-CM | POA: Diagnosis not present

## 2021-07-21 NOTE — Progress Notes (Signed)
Remote pacemaker transmission.   

## 2021-07-24 DIAGNOSIS — Z20822 Contact with and (suspected) exposure to covid-19: Secondary | ICD-10-CM | POA: Diagnosis not present

## 2021-07-25 DIAGNOSIS — Z20822 Contact with and (suspected) exposure to covid-19: Secondary | ICD-10-CM | POA: Diagnosis not present

## 2021-07-26 DIAGNOSIS — Z20822 Contact with and (suspected) exposure to covid-19: Secondary | ICD-10-CM | POA: Diagnosis not present

## 2021-07-30 DIAGNOSIS — M47816 Spondylosis without myelopathy or radiculopathy, lumbar region: Secondary | ICD-10-CM | POA: Diagnosis not present

## 2021-08-05 DIAGNOSIS — E531 Pyridoxine deficiency: Secondary | ICD-10-CM | POA: Diagnosis not present

## 2021-08-05 DIAGNOSIS — E538 Deficiency of other specified B group vitamins: Secondary | ICD-10-CM | POA: Diagnosis not present

## 2021-08-05 DIAGNOSIS — G603 Idiopathic progressive neuropathy: Secondary | ICD-10-CM | POA: Diagnosis not present

## 2021-08-05 DIAGNOSIS — G609 Hereditary and idiopathic neuropathy, unspecified: Secondary | ICD-10-CM | POA: Diagnosis not present

## 2021-08-05 DIAGNOSIS — E559 Vitamin D deficiency, unspecified: Secondary | ICD-10-CM | POA: Diagnosis not present

## 2021-08-05 DIAGNOSIS — M5416 Radiculopathy, lumbar region: Secondary | ICD-10-CM | POA: Diagnosis not present

## 2021-08-06 DIAGNOSIS — M47896 Other spondylosis, lumbar region: Secondary | ICD-10-CM | POA: Diagnosis not present

## 2021-08-06 DIAGNOSIS — R531 Weakness: Secondary | ICD-10-CM | POA: Diagnosis not present

## 2021-08-06 DIAGNOSIS — M4726 Other spondylosis with radiculopathy, lumbar region: Secondary | ICD-10-CM | POA: Diagnosis not present

## 2021-08-19 DIAGNOSIS — M47896 Other spondylosis, lumbar region: Secondary | ICD-10-CM | POA: Diagnosis not present

## 2021-08-19 DIAGNOSIS — R531 Weakness: Secondary | ICD-10-CM | POA: Diagnosis not present

## 2021-08-19 DIAGNOSIS — M4726 Other spondylosis with radiculopathy, lumbar region: Secondary | ICD-10-CM | POA: Diagnosis not present

## 2021-08-26 DIAGNOSIS — M4726 Other spondylosis with radiculopathy, lumbar region: Secondary | ICD-10-CM | POA: Diagnosis not present

## 2021-08-26 DIAGNOSIS — M47896 Other spondylosis, lumbar region: Secondary | ICD-10-CM | POA: Diagnosis not present

## 2021-08-26 DIAGNOSIS — R531 Weakness: Secondary | ICD-10-CM | POA: Diagnosis not present

## 2021-09-06 DIAGNOSIS — M47816 Spondylosis without myelopathy or radiculopathy, lumbar region: Secondary | ICD-10-CM | POA: Diagnosis not present

## 2021-09-06 DIAGNOSIS — H9222 Otorrhagia, left ear: Secondary | ICD-10-CM | POA: Diagnosis not present

## 2021-09-14 DIAGNOSIS — E782 Mixed hyperlipidemia: Secondary | ICD-10-CM | POA: Diagnosis not present

## 2021-09-14 DIAGNOSIS — I10 Essential (primary) hypertension: Secondary | ICD-10-CM | POA: Diagnosis not present

## 2021-09-14 DIAGNOSIS — F329 Major depressive disorder, single episode, unspecified: Secondary | ICD-10-CM | POA: Diagnosis not present

## 2021-09-14 DIAGNOSIS — I2510 Atherosclerotic heart disease of native coronary artery without angina pectoris: Secondary | ICD-10-CM | POA: Diagnosis not present

## 2021-09-24 DIAGNOSIS — M545 Low back pain, unspecified: Secondary | ICD-10-CM | POA: Diagnosis not present

## 2021-09-24 DIAGNOSIS — G603 Idiopathic progressive neuropathy: Secondary | ICD-10-CM | POA: Diagnosis not present

## 2021-09-24 DIAGNOSIS — R531 Weakness: Secondary | ICD-10-CM | POA: Diagnosis not present

## 2021-09-24 DIAGNOSIS — G250 Essential tremor: Secondary | ICD-10-CM | POA: Diagnosis not present

## 2021-09-30 DIAGNOSIS — I700 Atherosclerosis of aorta: Secondary | ICD-10-CM | POA: Diagnosis not present

## 2021-09-30 DIAGNOSIS — M545 Low back pain, unspecified: Secondary | ICD-10-CM | POA: Diagnosis not present

## 2021-09-30 DIAGNOSIS — M47816 Spondylosis without myelopathy or radiculopathy, lumbar region: Secondary | ICD-10-CM | POA: Diagnosis not present

## 2021-10-04 ENCOUNTER — Ambulatory Visit (INDEPENDENT_AMBULATORY_CARE_PROVIDER_SITE_OTHER): Payer: Medicare Other

## 2021-10-04 DIAGNOSIS — I495 Sick sinus syndrome: Secondary | ICD-10-CM

## 2021-10-05 DIAGNOSIS — G40209 Localization-related (focal) (partial) symptomatic epilepsy and epileptic syndromes with complex partial seizures, not intractable, without status epilepticus: Secondary | ICD-10-CM | POA: Diagnosis not present

## 2021-10-05 DIAGNOSIS — G603 Idiopathic progressive neuropathy: Secondary | ICD-10-CM | POA: Diagnosis not present

## 2021-10-05 DIAGNOSIS — M5126 Other intervertebral disc displacement, lumbar region: Secondary | ICD-10-CM | POA: Diagnosis not present

## 2021-10-05 LAB — CUP PACEART REMOTE DEVICE CHECK
Battery Remaining Longevity: 62 mo
Battery Remaining Percentage: 52 %
Battery Voltage: 2.99 V
Brady Statistic AP VP Percent: 1 %
Brady Statistic AP VS Percent: 93 %
Brady Statistic AS VP Percent: 1 %
Brady Statistic AS VS Percent: 6.4 %
Brady Statistic RA Percent Paced: 93 %
Brady Statistic RV Percent Paced: 1 %
Date Time Interrogation Session: 20230717090731
Implantable Lead Implant Date: 20180410
Implantable Lead Implant Date: 20180410
Implantable Lead Location: 753859
Implantable Lead Location: 753860
Implantable Pulse Generator Implant Date: 20180410
Lead Channel Impedance Value: 490 Ohm
Lead Channel Impedance Value: 530 Ohm
Lead Channel Pacing Threshold Amplitude: 0.625 V
Lead Channel Pacing Threshold Amplitude: 0.75 V
Lead Channel Pacing Threshold Pulse Width: 0.5 ms
Lead Channel Pacing Threshold Pulse Width: 0.5 ms
Lead Channel Sensing Intrinsic Amplitude: 0.4 mV
Lead Channel Sensing Intrinsic Amplitude: 6.9 mV
Lead Channel Setting Pacing Amplitude: 0.875
Lead Channel Setting Pacing Amplitude: 2 V
Lead Channel Setting Pacing Pulse Width: 0.5 ms
Lead Channel Setting Sensing Sensitivity: 2 mV
Pulse Gen Model: 2272
Pulse Gen Serial Number: 8016790

## 2021-10-15 DIAGNOSIS — I10 Essential (primary) hypertension: Secondary | ICD-10-CM | POA: Diagnosis not present

## 2021-10-15 DIAGNOSIS — F329 Major depressive disorder, single episode, unspecified: Secondary | ICD-10-CM | POA: Diagnosis not present

## 2021-10-15 DIAGNOSIS — E782 Mixed hyperlipidemia: Secondary | ICD-10-CM | POA: Diagnosis not present

## 2021-10-26 DIAGNOSIS — M5416 Radiculopathy, lumbar region: Secondary | ICD-10-CM | POA: Diagnosis not present

## 2021-10-26 DIAGNOSIS — M4726 Other spondylosis with radiculopathy, lumbar region: Secondary | ICD-10-CM | POA: Diagnosis not present

## 2021-10-26 DIAGNOSIS — G8929 Other chronic pain: Secondary | ICD-10-CM | POA: Diagnosis not present

## 2021-10-26 DIAGNOSIS — M533 Sacrococcygeal disorders, not elsewhere classified: Secondary | ICD-10-CM | POA: Diagnosis not present

## 2021-10-27 ENCOUNTER — Telehealth: Payer: Self-pay | Admitting: *Deleted

## 2021-10-27 ENCOUNTER — Encounter: Payer: Self-pay | Admitting: Internal Medicine

## 2021-10-27 NOTE — Telephone Encounter (Signed)
Primary Cardiologist:Philip Nahser, MD   Preoperative team, please contact this patient and set up a phone call appointment for further preoperative risk assessment. Please obtain consent and complete medication review. Thank you for your help.   I confirm that guidance regarding antiplatelet and oral anticoagulation therapy has been completed and, if necessary, noted below (none requested).   Emmaline Life, NP-C    10/27/2021, 4:14 PM [place] [address] Office [phone] Fax [phone]

## 2021-10-27 NOTE — Progress Notes (Signed)
PERIOPERATIVE PRESCRIPTION FOR IMPLANTED CARDIAC DEVICE PROGRAMMING   Patient Information:  Patient: Aaron Keller  MRN: [id number]  Date of Birth: [birth year]      Planned Procedure:  LEFT LAMINECTOMY  Surgeon:  Dr. Owens Shark Date of Procedure:  11/01/2021    Device Information:   Clinic EP Physician:   Dr. Thompson Grayer Device Type:  Pacemaker Manufacturer and Phone #:  St. Jude/Abbott: [phone] Pacemaker Dependent?:  No Date of Last Device Check:  10/04/21        Normal Device Function?:  Yes     Electrophysiologist's Recommendations:   Have magnet available. Provide continuous ECG monitoring when magnet is used or reprogramming is to be performed.  Procedure will likely interfere with device function.  Device should be programmed:  Asynchronous pacing during procedure and returned to normal programming after procedure  Per Device Clinic Standing Orders, Simone Curia  10/27/2021 4:03 PM

## 2021-10-27 NOTE — Telephone Encounter (Signed)
   Pre-operative Risk Assessment    Patient Name: Aaron Keller  DOB: April 15, 1945 MRN: [id number]    LOOKS TO BE ALSO NEEDING DEVICE CLEARANCE. I WILL HAND OFF THE FORMS TO DEVICE CLINIC  Request for Surgical Clearance    Procedure:   LEFT LAMINECTOMY   Date of Surgery:  Clearance 11/01/21                                 Surgeon:  DR. Owens Shark Surgeon's Group or Practice Name:  [facility] Phone number:  [phone] Fax number:  [phone]   Type of Clearance Requested:   - Medical ; NO MEDICATIONS LISTED AS NEEDING TO BE HELD   Type of Anesthesia:  CHOICE   Additional requests/questions:    Jiles Prows   10/27/2021, 3:56 PM

## 2021-10-27 NOTE — Telephone Encounter (Signed)
S/w both the pt and his wife. Have scheduled a tele pre op appt 10/28/21 @ 3:20 , add on as surgery is 11/01/21. Med rec and consent are done.

## 2021-10-27 NOTE — Telephone Encounter (Signed)
S/w both the pt and his wife. Have scheduled a tele pre op appt 10/28/21 @ 3:20 , add on as surgery is 11/01/21. Med rec and consent are done.     Patient Consent for Virtual Visit        Aaron Keller has provided verbal consent on 10/27/2021 for a virtual visit (video or telephone).   CONSENT FOR VIRTUAL VISIT FOR:  Aaron Keller  By participating in this virtual visit I agree to the following:  I hereby voluntarily request, consent and authorize [place] and its employed or contracted physicians, physician assistants, nurse practitioners or other licensed health care professionals (the Practitioner), to provide me with telemedicine health care services (the "Services") as deemed necessary by the treating Practitioner. I acknowledge and consent to receive the Services by the Practitioner via telemedicine. I understand that the telemedicine visit will involve communicating with the Practitioner through live audiovisual communication technology and the disclosure of certain medical information by electronic transmission. I acknowledge that I have been given the opportunity to request an in-person assessment or other available alternative prior to the telemedicine visit and am voluntarily participating in the telemedicine visit.  I understand that I have the right to withhold or withdraw my consent to the use of telemedicine in the course of my care at any time, without affecting my right to future care or treatment, and that the Practitioner or I may terminate the telemedicine visit at any time. I understand that I have the right to inspect all information obtained and/or recorded in the course of the telemedicine visit and may receive copies of available information for a reasonable fee.  I understand that some of the potential risks of receiving the Services via telemedicine include:  Delay or interruption in medical evaluation due to technological equipment failure or  disruption; Information transmitted may not be sufficient (e.g. poor resolution of images) to allow for appropriate medical decision making by the Practitioner; and/or  In rare instances, security protocols could fail, causing a breach of personal health information.  Furthermore, I acknowledge that it is my responsibility to provide information about my medical history, conditions and care that is complete and accurate to the best of my ability. I acknowledge that Practitioner's advice, recommendations, and/or decision may be based on factors not within their control, such as incomplete or inaccurate data provided by me or distortions of diagnostic images or specimens that may result from electronic transmissions. I understand that the practice of medicine is not an exact science and that Practitioner makes no warranties or guarantees regarding treatment outcomes. I acknowledge that a copy of this consent can be made available to me via my patient portal ([facility]), or I can request a printed copy by calling the office of [facility].    I understand that my insurance will be billed for this visit.   I have read or had this consent read to me. I understand the contents of this consent, which adequately explains the benefits and risks of the Services being provided via telemedicine.  I have been provided ample opportunity to ask questions regarding this consent and the Services and have had my questions answered to my satisfaction. I give my informed consent for the services to be provided through the use of telemedicine in my medical care

## 2021-10-28 ENCOUNTER — Ambulatory Visit (INDEPENDENT_AMBULATORY_CARE_PROVIDER_SITE_OTHER): Payer: Medicare Other | Admitting: Physician Assistant

## 2021-10-28 DIAGNOSIS — Z01810 Encounter for preprocedural cardiovascular examination: Secondary | ICD-10-CM | POA: Diagnosis not present

## 2021-10-28 NOTE — Progress Notes (Signed)
Virtual Visit via Telephone Note   Because of Aaron Keller's co-morbid illnesses, he is at least at moderate risk for complications without adequate follow up.  This format is felt to be most appropriate for this patient at this time.  The patient did not have access to video technology/had technical difficulties with video requiring transitioning to audio format only (telephone).  All issues noted in this document were discussed and addressed.  No physical exam could be performed with this format.  Please refer to the patient's chart for his consent to telehealth for [facility].  Evaluation Performed:  Preoperative cardiovascular risk assessment _____________   Date:  10/28/2021   Patient ID:  Aaron Keller, DOB Aug 15, 1945, MRN [id number] Patient Location:  Home Provider location:   Office  Primary Care Provider:  Antony Contras, MD Primary Cardiologist:  Mertie Moores, MD  Chief Complaint / Patient Profile   76 y.o. y/o male with a h/o CAD, SSS s/p PPM, HTN, HLD, stroke 2002, encephalitis who is pending laminectomy and presents today for telephonic preoperative cardiovascular risk assessment.  Past Medical History    Past Medical History:  Diagnosis Date   CAD (coronary artery disease) 04/04/2016   LHC 1/18: mLAD 94, dLAD 70/90, oLPDA 50, mRCA 60, EF 55-65 // Nuc Stress 10/19:  EF 64, normal perfusion, low risk   Echocardiogram    Echo 1/18: Moderate concentric LVH, EF 65-70, normal wall motion, grade 1 diastolic dysfunction, mild AI, trivial TR   Encephalitis 02/18/1985   Hepatitis ~ 1959   "don't know what kind"   HTN (hypertension) 04/04/2016   Hx of tonic-clonic seizures 02/18/1985 X 1   related to encephalitis   Hypertension    Nasal bleeding    "because of one of the RX I was on" (06/29/2016)   Obesity (BMI 30-39.9) 06/15/2017   OSA (obstructive sleep apnea) 06/15/2017   Moderate with AHI 16.6/hr now on CPAP at 12cm H2O   Presence of permanent cardiac  pacemaker    Seasonal allergies    Sick sinus syndrome [facility])    s/p dual chamber pacemaker in 2018   Stroke [facility])    "I've had 2 minor ones"; denies residual on 06/28/2016   Past Surgical History:  Procedure Laterality Date   APPENDECTOMY     CARDIAC CATHETERIZATION N/A 04/01/2016   Procedure: Left Heart Cath and Coronary Angiography;  Surgeon: Peter M Martinique, MD;  Location: [place] [facility];  Service: Cardiovascular;  Laterality: N/A;   CARDIAC CATHETERIZATION N/A 04/01/2016   Procedure: Temporary Pacemaker;  Surgeon: Peter M Martinique, MD;  Location: [place] [facility];  Service: Cardiovascular;  Laterality: N/A;   CATARACT EXTRACTION W/ INTRAOCULAR LENS  IMPLANT, BILATERAL Bilateral ~ 2007   ELBOW SURGERY Right    FRACTURE SURGERY     INGUINAL HERNIA REPAIR Right 08/2010   PACEMAKER IMPLANT N/A 06/28/2016   Procedure: Pacemaker Implant;  Surgeon: Thompson Grayer, MD;  Location: [place] [facility];  Service: Cardiovascular;  Laterality: N/A;   PACEMAKER IMPLANT  06/28/2016   SJM Assurity MRI PPM implanted by Dr Rayann Heman for sick sinus syndrome   WRIST FRACTURE SURGERY Left 1950s    Allergies  Allergies  Allergen Reactions   Cyclosporine Other (See Comments)    Unknown   Serzone [Nefazodone Hcl] Other (See Comments)    Unknown   Spironolactone     Other reaction(s): hyponatremia; hypocalcemia    History of Present Illness    Aaron Keller is a  76 y.o. male who presents via audio/video conferencing for a telehealth visit today.  Pt was last seen in cardiology clinic on 04/13/21 by Dr. Acie Fredrickson.  At that time [name] was doing well and cleared for nerve root ablation at that time. Last cath in 2018 with 60% mRCa, 35% mLAD, 70% dLAD1, 90% dLAD2, 50% PDA. Last nuc 2019 low risk, EF normal. The patient is now pending procedure as outlined above. Since his last visit, he reports he has done very well.   Home Medications    Prior to Admission medications   Medication Sig Start  Date End Date Taking? Authorizing Provider  amLODipine (NORVASC) 10 MG tablet Take 10 mg by mouth daily.    [provider]  atorvastatin (LIPITOR) 10 MG tablet Take 10 mg by mouth daily.    [provider]  azelastine (ASTELIN) 0.1 % nasal spray 2 sprays in each nostril 1-2 times daily 05/25/21   Kennith Gain, MD  carbamazepine (TEGRETOL) 200 MG tablet Take 200 mg by mouth 4 (four) times daily.    [provider]  carvedilol (COREG) 25 MG tablet Take 1 tablet (25 mg total) by mouth 2 (two) times daily with a meal for 14 doses. 03/17/21   Shirley Friar, PA-C  Cholecalciferol (VITAMIN D) 50 MCG (2000 UT) tablet Take 2,000 Units by mouth daily.    [provider]  citalopram (CELEXA) 40 MG tablet Take 40 mg by mouth daily.    [provider]  clorazepate (TRANXENE) 3.75 MG tablet Take 3.75 mg by mouth 2 (two) times daily.    [provider]  dipyridamole-aspirin (AGGRENOX) 200-25 MG 12hr capsule Take 2 capsules by mouth at bedtime.    [provider]  folic acid (FOLVITE) 1 MG tablet Take 1 mg by mouth daily.    [provider]  gabapentin (NEURONTIN) 800 MG tablet Take 800 mg by mouth 2 (two) times daily.    [provider]  isosorbide mononitrate (IMDUR) 30 MG 24 hr tablet Take 1 tablet (30 mg total) by mouth daily. 05/21/18   Richardson Dopp T, PA-C  lisinopril (PRINIVIL,ZESTRIL) 40 MG tablet Take 1 tablet by mouth daily 06/01/17   End, Harrell Gave, MD  nitroGLYCERIN (NITROSTAT) 0.4 MG SL tablet Place 1 tablet (0.4 mg total) under the tongue every 5 (five) minutes x 3 doses as needed for chest pain. 12/14/17   End, Harrell Gave, MD    Physical Exam    Vital Signs:  [name] does not have vital signs available for review today.  Given telephonic nature of communication, physical exam is limited. AAOx3. NAD. Normal affect.  Speech and respirations are unlabored.  Accessory Clinical Findings     None  Assessment & Plan    1.  Preoperative Cardiovascular Risk Assessment: RCRI 6.6% indicating moderate CV risk. The patient affirms he has been doing well without any new cardiac symptoms. They are able to achieve over 4 METS without cardiac limitations. He cites activities such as going up stairs, yardwork, carrying in groceries without any angina or dyspnea. Therefore, based on ACC/AHA guidelines, the patient would be at acceptable risk for the planned procedure without further cardiovascular testing. The patient was advised that if he develops new symptoms prior to surgery to contact our office to arrange for a follow-up visit, and he verbalized understanding.  Reviewed antiplatelet with Dr. Gasper Sells in Dr. Elmarie Shiley absence. Patient is on aggrenox and states he began holding yesterday. While there is a  risk of stopping antiplatelet with regard to CV risk, if bleeding risk from surgery is felt too great, OK to hold as well from our standpoint. We typically advise that blood thinners be resumed when felt safe by performing physician.  A copy of this note will be routed to requesting surgeon.  Time:   Today, I have spent 5 minutes with the patient with telehealth technology discussing medical history, symptoms, and management plan.     Charlie Pitter, PA-C  10/28/2021, 3:13 PM

## 2021-11-01 DIAGNOSIS — Z7951 Long term (current) use of inhaled steroids: Secondary | ICD-10-CM | POA: Diagnosis not present

## 2021-11-01 DIAGNOSIS — M4316 Spondylolisthesis, lumbar region: Secondary | ICD-10-CM | POA: Diagnosis not present

## 2021-11-01 DIAGNOSIS — G4730 Sleep apnea, unspecified: Secondary | ICD-10-CM | POA: Diagnosis not present

## 2021-11-01 DIAGNOSIS — M48062 Spinal stenosis, lumbar region with neurogenic claudication: Secondary | ICD-10-CM | POA: Diagnosis not present

## 2021-11-01 DIAGNOSIS — M48061 Spinal stenosis, lumbar region without neurogenic claudication: Secondary | ICD-10-CM | POA: Diagnosis not present

## 2021-11-01 DIAGNOSIS — M5416 Radiculopathy, lumbar region: Secondary | ICD-10-CM | POA: Diagnosis not present

## 2021-11-01 DIAGNOSIS — Z79899 Other long term (current) drug therapy: Secondary | ICD-10-CM | POA: Diagnosis not present

## 2021-11-01 DIAGNOSIS — M5136 Other intervertebral disc degeneration, lumbar region: Secondary | ICD-10-CM | POA: Diagnosis not present

## 2021-11-01 DIAGNOSIS — Z950 Presence of cardiac pacemaker: Secondary | ICD-10-CM | POA: Diagnosis not present

## 2021-11-01 DIAGNOSIS — M5459 Other low back pain: Secondary | ICD-10-CM | POA: Diagnosis not present

## 2021-11-01 DIAGNOSIS — I2510 Atherosclerotic heart disease of native coronary artery without angina pectoris: Secondary | ICD-10-CM | POA: Diagnosis not present

## 2021-11-01 DIAGNOSIS — Z8673 Personal history of transient ischemic attack (TIA), and cerebral infarction without residual deficits: Secondary | ICD-10-CM | POA: Diagnosis not present

## 2021-11-01 DIAGNOSIS — M47816 Spondylosis without myelopathy or radiculopathy, lumbar region: Secondary | ICD-10-CM | POA: Diagnosis not present

## 2021-11-02 DIAGNOSIS — Z8673 Personal history of transient ischemic attack (TIA), and cerebral infarction without residual deficits: Secondary | ICD-10-CM | POA: Diagnosis not present

## 2021-11-02 DIAGNOSIS — M48062 Spinal stenosis, lumbar region with neurogenic claudication: Secondary | ICD-10-CM | POA: Diagnosis not present

## 2021-11-02 DIAGNOSIS — M5416 Radiculopathy, lumbar region: Secondary | ICD-10-CM | POA: Diagnosis not present

## 2021-11-02 DIAGNOSIS — Z7951 Long term (current) use of inhaled steroids: Secondary | ICD-10-CM | POA: Diagnosis not present

## 2021-11-02 DIAGNOSIS — Z79899 Other long term (current) drug therapy: Secondary | ICD-10-CM | POA: Diagnosis not present

## 2021-11-02 DIAGNOSIS — G4730 Sleep apnea, unspecified: Secondary | ICD-10-CM | POA: Diagnosis not present

## 2021-11-05 NOTE — Progress Notes (Signed)
Remote pacemaker transmission.   

## 2021-11-19 DIAGNOSIS — R531 Weakness: Secondary | ICD-10-CM | POA: Diagnosis not present

## 2021-11-19 DIAGNOSIS — M47816 Spondylosis without myelopathy or radiculopathy, lumbar region: Secondary | ICD-10-CM | POA: Diagnosis not present

## 2021-11-19 DIAGNOSIS — M5136 Other intervertebral disc degeneration, lumbar region: Secondary | ICD-10-CM | POA: Diagnosis not present

## 2021-11-19 DIAGNOSIS — M4316 Spondylolisthesis, lumbar region: Secondary | ICD-10-CM | POA: Diagnosis not present

## 2021-11-19 DIAGNOSIS — M5126 Other intervertebral disc displacement, lumbar region: Secondary | ICD-10-CM | POA: Diagnosis not present

## 2021-11-19 DIAGNOSIS — M549 Dorsalgia, unspecified: Secondary | ICD-10-CM | POA: Diagnosis not present

## 2021-11-19 DIAGNOSIS — Z9889 Other specified postprocedural states: Secondary | ICD-10-CM | POA: Diagnosis not present

## 2021-11-19 DIAGNOSIS — M2578 Osteophyte, vertebrae: Secondary | ICD-10-CM | POA: Diagnosis not present

## 2021-11-19 DIAGNOSIS — M1612 Unilateral primary osteoarthritis, left hip: Secondary | ICD-10-CM | POA: Diagnosis not present

## 2021-12-03 DIAGNOSIS — N400 Enlarged prostate without lower urinary tract symptoms: Secondary | ICD-10-CM | POA: Diagnosis not present

## 2021-12-03 DIAGNOSIS — M5432 Sciatica, left side: Secondary | ICD-10-CM | POA: Diagnosis not present

## 2021-12-03 DIAGNOSIS — G629 Polyneuropathy, unspecified: Secondary | ICD-10-CM | POA: Diagnosis not present

## 2021-12-03 DIAGNOSIS — J309 Allergic rhinitis, unspecified: Secondary | ICD-10-CM | POA: Diagnosis not present

## 2021-12-03 DIAGNOSIS — G40909 Epilepsy, unspecified, not intractable, without status epilepticus: Secondary | ICD-10-CM | POA: Diagnosis not present

## 2021-12-03 DIAGNOSIS — E871 Hypo-osmolality and hyponatremia: Secondary | ICD-10-CM | POA: Diagnosis not present

## 2021-12-03 DIAGNOSIS — F339 Major depressive disorder, recurrent, unspecified: Secondary | ICD-10-CM | POA: Diagnosis not present

## 2021-12-03 DIAGNOSIS — D696 Thrombocytopenia, unspecified: Secondary | ICD-10-CM | POA: Diagnosis not present

## 2021-12-03 DIAGNOSIS — E559 Vitamin D deficiency, unspecified: Secondary | ICD-10-CM | POA: Diagnosis not present

## 2021-12-03 DIAGNOSIS — Z0000 Encounter for general adult medical examination without abnormal findings: Secondary | ICD-10-CM | POA: Diagnosis not present

## 2021-12-03 DIAGNOSIS — E782 Mixed hyperlipidemia: Secondary | ICD-10-CM | POA: Diagnosis not present

## 2021-12-03 DIAGNOSIS — I10 Essential (primary) hypertension: Secondary | ICD-10-CM | POA: Diagnosis not present

## 2021-12-09 DIAGNOSIS — M47816 Spondylosis without myelopathy or radiculopathy, lumbar region: Secondary | ICD-10-CM | POA: Diagnosis not present

## 2021-12-09 DIAGNOSIS — M47817 Spondylosis without myelopathy or radiculopathy, lumbosacral region: Secondary | ICD-10-CM | POA: Diagnosis not present

## 2021-12-09 DIAGNOSIS — M5136 Other intervertebral disc degeneration, lumbar region: Secondary | ICD-10-CM | POA: Diagnosis not present

## 2021-12-09 DIAGNOSIS — M4316 Spondylolisthesis, lumbar region: Secondary | ICD-10-CM | POA: Diagnosis not present

## 2021-12-09 DIAGNOSIS — Z9889 Other specified postprocedural states: Secondary | ICD-10-CM | POA: Diagnosis not present

## 2021-12-09 DIAGNOSIS — M48061 Spinal stenosis, lumbar region without neurogenic claudication: Secondary | ICD-10-CM | POA: Diagnosis not present

## 2021-12-28 DIAGNOSIS — B004 Herpesviral encephalitis: Secondary | ICD-10-CM | POA: Diagnosis not present

## 2021-12-28 DIAGNOSIS — M5416 Radiculopathy, lumbar region: Secondary | ICD-10-CM | POA: Diagnosis not present

## 2021-12-28 DIAGNOSIS — M549 Dorsalgia, unspecified: Secondary | ICD-10-CM | POA: Diagnosis not present

## 2021-12-28 DIAGNOSIS — G603 Idiopathic progressive neuropathy: Secondary | ICD-10-CM | POA: Diagnosis not present

## 2021-12-31 DIAGNOSIS — Z23 Encounter for immunization: Secondary | ICD-10-CM | POA: Diagnosis not present

## 2022-01-03 ENCOUNTER — Ambulatory Visit (INDEPENDENT_AMBULATORY_CARE_PROVIDER_SITE_OTHER): Payer: Medicare Other

## 2022-01-03 DIAGNOSIS — I495 Sick sinus syndrome: Secondary | ICD-10-CM | POA: Diagnosis not present

## 2022-01-04 LAB — CUP PACEART REMOTE DEVICE CHECK
Battery Remaining Longevity: 60 mo
Battery Remaining Percentage: 50 %
Battery Voltage: 2.99 V
Brady Statistic AP VP Percent: 1 %
Brady Statistic AP VS Percent: 94 %
Brady Statistic AS VP Percent: 1 %
Brady Statistic AS VS Percent: 6.2 %
Brady Statistic RA Percent Paced: 93 %
Brady Statistic RV Percent Paced: 1 %
Date Time Interrogation Session: 20231016034733
Implantable Lead Implant Date: 20180410
Implantable Lead Implant Date: 20180410
Implantable Lead Location: 753859
Implantable Lead Location: 753860
Implantable Pulse Generator Implant Date: 20180410
Lead Channel Impedance Value: 490 Ohm
Lead Channel Impedance Value: 510 Ohm
Lead Channel Pacing Threshold Amplitude: 0.625 V
Lead Channel Pacing Threshold Amplitude: 0.75 V
Lead Channel Pacing Threshold Pulse Width: 0.5 ms
Lead Channel Pacing Threshold Pulse Width: 0.5 ms
Lead Channel Sensing Intrinsic Amplitude: 1.8 mV
Lead Channel Sensing Intrinsic Amplitude: 6.3 mV
Lead Channel Setting Pacing Amplitude: 0.875
Lead Channel Setting Pacing Amplitude: 2 V
Lead Channel Setting Pacing Pulse Width: 0.5 ms
Lead Channel Setting Sensing Sensitivity: 2 mV
Pulse Gen Model: 2272
Pulse Gen Serial Number: 8016790

## 2022-01-11 DIAGNOSIS — M549 Dorsalgia, unspecified: Secondary | ICD-10-CM | POA: Diagnosis not present

## 2022-01-11 DIAGNOSIS — G603 Idiopathic progressive neuropathy: Secondary | ICD-10-CM | POA: Diagnosis not present

## 2022-01-11 DIAGNOSIS — M5416 Radiculopathy, lumbar region: Secondary | ICD-10-CM | POA: Diagnosis not present

## 2022-01-21 NOTE — Progress Notes (Signed)
Remote pacemaker transmission.   

## 2022-02-09 DIAGNOSIS — L814 Other melanin hyperpigmentation: Secondary | ICD-10-CM | POA: Diagnosis not present

## 2022-02-09 DIAGNOSIS — L820 Inflamed seborrheic keratosis: Secondary | ICD-10-CM | POA: Diagnosis not present

## 2022-02-09 DIAGNOSIS — D225 Melanocytic nevi of trunk: Secondary | ICD-10-CM | POA: Diagnosis not present

## 2022-02-09 DIAGNOSIS — L538 Other specified erythematous conditions: Secondary | ICD-10-CM | POA: Diagnosis not present

## 2022-02-09 DIAGNOSIS — L821 Other seborrheic keratosis: Secondary | ICD-10-CM | POA: Diagnosis not present

## 2022-03-07 DIAGNOSIS — R531 Weakness: Secondary | ICD-10-CM | POA: Diagnosis not present

## 2022-03-07 DIAGNOSIS — M5417 Radiculopathy, lumbosacral region: Secondary | ICD-10-CM | POA: Diagnosis not present

## 2022-03-07 DIAGNOSIS — G40209 Localization-related (focal) (partial) symptomatic epilepsy and epileptic syndromes with complex partial seizures, not intractable, without status epilepticus: Secondary | ICD-10-CM | POA: Diagnosis not present

## 2022-03-07 DIAGNOSIS — M549 Dorsalgia, unspecified: Secondary | ICD-10-CM | POA: Diagnosis not present

## 2022-03-23 ENCOUNTER — Ambulatory Visit: Payer: Medicare Other | Admitting: Cardiology

## 2022-04-04 ENCOUNTER — Ambulatory Visit (INDEPENDENT_AMBULATORY_CARE_PROVIDER_SITE_OTHER): Payer: Medicare Other

## 2022-04-04 DIAGNOSIS — I495 Sick sinus syndrome: Secondary | ICD-10-CM

## 2022-04-05 DIAGNOSIS — L814 Other melanin hyperpigmentation: Secondary | ICD-10-CM | POA: Diagnosis not present

## 2022-04-05 DIAGNOSIS — L820 Inflamed seborrheic keratosis: Secondary | ICD-10-CM | POA: Diagnosis not present

## 2022-04-05 DIAGNOSIS — D1801 Hemangioma of skin and subcutaneous tissue: Secondary | ICD-10-CM | POA: Diagnosis not present

## 2022-04-05 DIAGNOSIS — L538 Other specified erythematous conditions: Secondary | ICD-10-CM | POA: Diagnosis not present

## 2022-04-05 DIAGNOSIS — L298 Other pruritus: Secondary | ICD-10-CM | POA: Diagnosis not present

## 2022-04-05 DIAGNOSIS — L821 Other seborrheic keratosis: Secondary | ICD-10-CM | POA: Diagnosis not present

## 2022-04-05 DIAGNOSIS — R208 Other disturbances of skin sensation: Secondary | ICD-10-CM | POA: Diagnosis not present

## 2022-04-05 LAB — CUP PACEART REMOTE DEVICE CHECK
Battery Remaining Longevity: 56 mo
Battery Remaining Percentage: 48 %
Battery Voltage: 2.98 V
Brady Statistic AP VP Percent: 1 %
Brady Statistic AP VS Percent: 94 %
Brady Statistic AS VP Percent: 1 %
Brady Statistic AS VS Percent: 5.8 %
Brady Statistic RA Percent Paced: 94 %
Brady Statistic RV Percent Paced: 1 %
Date Time Interrogation Session: 20240115032415
Implantable Lead Connection Status: 753985
Implantable Lead Connection Status: 753985
Implantable Lead Implant Date: 20180410
Implantable Lead Implant Date: 20180410
Implantable Lead Location: 753859
Implantable Lead Location: 753860
Implantable Pulse Generator Implant Date: 20180410
Lead Channel Impedance Value: 480 Ohm
Lead Channel Impedance Value: 510 Ohm
Lead Channel Pacing Threshold Amplitude: 0.75 V
Lead Channel Pacing Threshold Amplitude: 0.75 V
Lead Channel Pacing Threshold Pulse Width: 0.5 ms
Lead Channel Pacing Threshold Pulse Width: 0.5 ms
Lead Channel Sensing Intrinsic Amplitude: 2.1 mV
Lead Channel Sensing Intrinsic Amplitude: 6.3 mV
Lead Channel Setting Pacing Amplitude: 1 V
Lead Channel Setting Pacing Amplitude: 2 V
Lead Channel Setting Pacing Pulse Width: 0.5 ms
Lead Channel Setting Sensing Sensitivity: 2 mV
Pulse Gen Model: 2272
Pulse Gen Serial Number: 8016790

## 2022-05-06 ENCOUNTER — Encounter: Payer: Self-pay | Admitting: Cardiology

## 2022-05-06 ENCOUNTER — Ambulatory Visit: Payer: Medicare Other | Attending: Cardiology | Admitting: Cardiology

## 2022-05-06 VITALS — BP 134/82 | HR 63 | Ht 64.0 in | Wt 185.4 lb

## 2022-05-06 DIAGNOSIS — I10 Essential (primary) hypertension: Secondary | ICD-10-CM | POA: Diagnosis not present

## 2022-05-06 DIAGNOSIS — I495 Sick sinus syndrome: Secondary | ICD-10-CM | POA: Diagnosis not present

## 2022-05-06 DIAGNOSIS — I2510 Atherosclerotic heart disease of native coronary artery without angina pectoris: Secondary | ICD-10-CM | POA: Diagnosis not present

## 2022-05-06 NOTE — Progress Notes (Signed)
Electrophysiology Office Note   Date:  05/06/2022   ID:  Aaron Keller, DOB Nov 13, 1945, MRN [id number]  PCP:  Aaron Contras, MD  Cardiologist:  Aaron Keller Primary Electrophysiologist:  Aaron Vicario Meredith Leeds, MD    Chief Complaint: sick sinus   History of Present Illness: Aaron Keller is a 77 y.o. male who is being seen today for the evaluation of sick sinus at the request of Aaron Contras, MD. Presenting today for electrophysiology evaluation.  He has a history of severe coronary artery disease, sick sinus syndrome status post [facility] Jude dual-chamber pacemaker, hypertension, hyperlipidemia, CVA.  Today, he denies symptoms of palpitations, chest pain, shortness of breath, orthopnea, PND, lower extremity edema, claudication, dizziness, presyncope, syncope, bleeding, or neurologic sequela. The patient is tolerating medications without difficulties.    Past Medical History:  Diagnosis Date   CAD (coronary artery disease) 04/04/2016   LHC 1/18: mLAD 53, dLAD 70/90, oLPDA 50, mRCA 60, EF 55-65 // Nuc Stress 10/19:  EF 64, normal perfusion, low risk   Echocardiogram    Echo 1/18: Moderate concentric LVH, EF 65-70, normal wall motion, grade 1 diastolic dysfunction, mild AI, trivial TR   Encephalitis 02/18/1985   Hepatitis ~ 1959   "don't know what kind"   HTN (hypertension) 04/04/2016   Hx of tonic-clonic seizures 02/18/1985 X 1   related to encephalitis   Hypertension    Nasal bleeding    "because of one of the RX I was on" (06/29/2016)   Obesity (BMI 30-39.9) 06/15/2017   OSA (obstructive sleep apnea) 06/15/2017   Moderate with AHI 16.6/hr now on CPAP at 12cm H2O   Presence of permanent cardiac pacemaker    Seasonal allergies    Sick sinus syndrome [facility])    s/p dual chamber pacemaker in 2018   Stroke [facility])    "I've had 2 minor ones"; denies residual on 06/28/2016   Past Surgical History:  Procedure Laterality Date   APPENDECTOMY     CARDIAC CATHETERIZATION N/A 04/01/2016    Procedure: Left Heart Cath and Coronary Angiography;  Surgeon: Peter M Martinique, MD;  Location: [place] [facility];  Service: Cardiovascular;  Laterality: N/A;   CARDIAC CATHETERIZATION N/A 04/01/2016   Procedure: Temporary Pacemaker;  Surgeon: Peter M Martinique, MD;  Location: [place] [facility];  Service: Cardiovascular;  Laterality: N/A;   CATARACT EXTRACTION W/ INTRAOCULAR LENS  IMPLANT, BILATERAL Bilateral ~ 2007   ELBOW SURGERY Right    FRACTURE SURGERY     INGUINAL HERNIA REPAIR Right 08/2010   PACEMAKER IMPLANT N/A 06/28/2016   Procedure: Pacemaker Implant;  Surgeon: Thompson Grayer, MD;  Location: [place] [facility];  Service: Cardiovascular;  Laterality: N/A;   PACEMAKER IMPLANT  06/28/2016   SJM Assurity MRI PPM implanted by Dr Rayann Heman for sick sinus syndrome   WRIST FRACTURE SURGERY Left 1950s     Current Outpatient Medications  Medication Sig Dispense Refill   amLODipine (NORVASC) 10 MG tablet Take 10 mg by mouth daily.     atorvastatin (LIPITOR) 10 MG tablet Take 10 mg by mouth daily.     azelastine (ASTELIN) 0.1 % nasal spray 2 sprays in each nostril 1-2 times daily 30 mL 3   carbamazepine (TEGRETOL) 200 MG tablet Take 200 mg by mouth 4 (four) times daily.     carvedilol (COREG) 25 MG tablet Take 1 tablet (25 mg total) by mouth 2 (two) times daily with a meal for 14 doses. 14 tablet 0   Cholecalciferol (VITAMIN D)  50 MCG (2000 UT) tablet Take 2,000 Units by mouth daily.     citalopram (CELEXA) 40 MG tablet Take 40 mg by mouth daily.     clorazepate (TRANXENE) 3.75 MG tablet Take 3.75 mg by mouth 2 (two) times daily.     cyanocobalamin (VITAMIN B12) 500 MCG tablet Take 500 mcg by mouth daily.     diclofenac (VOLTAREN) 75 MG EC tablet Take 75 mg by mouth as needed for moderate pain.     dipyridamole-aspirin (AGGRENOX) 200-25 MG 12hr capsule Take 2 capsules by mouth at bedtime.     folic acid (FOLVITE) 1 MG tablet Take 1 mg by mouth daily.     gabapentin (NEURONTIN) 800 MG tablet Take  800 mg by mouth 2 (two) times daily.     isosorbide mononitrate (IMDUR) 30 MG 24 hr tablet Take 1 tablet (30 mg total) by mouth daily. 90 tablet 3   lisinopril (PRINIVIL,ZESTRIL) 40 MG tablet Take 1 tablet by mouth daily 90 tablet 3   nitroGLYCERIN (NITROSTAT) 0.4 MG SL tablet Place 1 tablet (0.4 mg total) under the tongue every 5 (five) minutes x 3 doses as needed for chest pain. 25 tablet 4   No current facility-administered medications for this visit.    Allergies:   Cyclosporine, Nefazodone hcl, Cyclandelate, and Spironolactone   Social History:  The patient  reports that he has quit smoking. His smoking use included cigarettes. He has never used smokeless tobacco. He reports that he does not drink alcohol and does not use drugs.   Family History:  The patient's family history includes Cancer in his father; Heart attack in his mother; Heart disease in his mother.    ROS:  Please see the history of present illness.   Otherwise, review of systems is positive for none.   All other systems are reviewed and negative.    PHYSICAL EXAM: VS:  BP 134/82   Pulse 63   Ht 5' 4"$  (1.626 m)   Wt 185 lb 6.4 oz (84.1 kg)   SpO2 99%   BMI 31.82 kg/m  , BMI Body mass index is 31.82 kg/m. GEN: Well nourished, well developed, in no acute distress  HEENT: normal  Neck: no JVD, carotid bruits, or masses Cardiac: RRR; no murmurs, rubs, or gallops,no edema  Respiratory:  clear to auscultation bilaterally, normal work of breathing GI: soft, nontender, nondistended, + BS MS: no deformity or atrophy  Skin: warm and dry, device pocket is well healed Neuro:  Strength and sensation are intact Psych: euthymic mood, full affect  EKG:  EKG is ordered today. Personal review of the ekg ordered shows AV paced  Device interrogation is reviewed today in detail.  See PaceArt for details.   Recent Labs: No results found for requested labs within last 365 days.    Lipid Panel     Component Value  Date/Time   CHOL 126 05/22/2019 1110   TRIG 125 05/22/2019 1110   HDL 51 05/22/2019 1110   CHOLHDL 2.5 05/22/2019 1110   CHOLHDL 2.6 04/02/2016 0500   VLDL 19 04/02/2016 0500   LDLCALC 53 05/22/2019 1110     Wt Readings from Last 3 Encounters:  05/06/22 185 lb 6.4 oz (84.1 kg)  05/25/21 184 lb 3.2 oz (83.6 kg)  04/13/21 184 lb 9.6 oz (83.7 kg)      Other studies Reviewed: Additional studies/ records that were reviewed today include: TTE 2018  Review of the above records today demonstrates:  - Left ventricle: The  cavity size was normal. There was moderate    concentric hypertrophy. Systolic function was vigorous. The    estimated ejection fraction was in the range of 65% to 70%. Wall    motion was normal; there were no regional wall motion    abnormalities. Doppler parameters are consistent with abnormal    left ventricular relaxation (grade 1 diastolic dysfunction).    There was no evidence of elevated ventricular filling pressure by    Doppler parameters.  - Aortic valve: There was mild regurgitation.  - Aortic root: The aortic root was normal in size.  - Mitral valve: There was no regurgitation.  - Left atrium: The atrium was normal in size.  - Right ventricle: Systolic function was normal.  - Right atrium: The atrium was normal in size.  - Tricuspid valve: There was trivial regurgitation.  - Pulmonary arteries: Systolic pressure was within the normal    range.  - Inferior vena cava: The vessel was normal in size.  - Pericardium, extracardiac: There was no pericardial effusion.    ASSESSMENT AND PLAN:  1.  Sinus syndrome: Status post Abbott dual-chamber pacemaker.  Device function appropriate.  Threshold, impedance, sensing within normal limits.  [employer] continue with current management.  2.  Coronary artery disease: No current chest pain.  Plan per primary cardiology.  3.  Hypertension: well controlled  4.  Hyperlipidemia: Goal LDL less than 70.  Plan per primary  cardiology.    Current medicines are reviewed at length with the patient today.   The patient does not have concerns regarding his medicines.  The following changes were made today:  none  Labs/ tests ordered today include:  Orders Placed This Encounter  Procedures   EKG 12-Lead     Disposition:   FU with Cherrelle Plante 1 year  Signed, Ceanna Wareing Meredith Leeds, MD  05/06/2022 3:36 PM     [address] [place] [id number] [phone] (office) [phone] (fax)

## 2022-05-06 NOTE — Patient Instructions (Signed)
Medication Instructions:  Your physician recommends that you continue on your current medications as directed. Please refer to the Current Medication list given to you today.  *If you need a refill on your cardiac medications before your next appointment, please call your pharmacy*   Lab Work: None ordered   Testing/Procedures: None ordered   Follow-Up: At [facility], you and your health needs are our priority.  As part of our continuing mission to provide you with exceptional heart care, we have created designated Provider Care Teams.  These Care Teams include your primary Cardiologist (physician) and Advanced Practice Providers (APPs -  Physician Assistants and Nurse Practitioners) who all work together to provide you with the care you need, when you need it.  Remote monitoring is used to monitor your Pacemaker or ICD from home. This monitoring reduces the number of office visits required to check your device to one time per year. It allows [place] to keep an eye on the functioning of your device to ensure it is working properly. You are scheduled for a device check from home on 07/04/2022. You may send your transmission at any time that day. If you have a wireless device, the transmission will be sent automatically. After your physician reviews your transmission, you will receive a postcard with your next transmission date.  Your next appointment:   1 year(s)  The format for your next appointment:   In Person  Provider:   Allegra Lai, MD    Thank you for choosing [facility]!!   Trinidad Curet, RN [phone]    Other Instructions

## 2022-05-16 NOTE — Progress Notes (Signed)
Remote pacemaker transmission.   

## 2022-06-02 DIAGNOSIS — G40909 Epilepsy, unspecified, not intractable, without status epilepticus: Secondary | ICD-10-CM | POA: Diagnosis not present

## 2022-06-02 DIAGNOSIS — M545 Low back pain, unspecified: Secondary | ICD-10-CM | POA: Diagnosis not present

## 2022-06-02 DIAGNOSIS — E559 Vitamin D deficiency, unspecified: Secondary | ICD-10-CM | POA: Diagnosis not present

## 2022-06-02 DIAGNOSIS — I10 Essential (primary) hypertension: Secondary | ICD-10-CM | POA: Diagnosis not present

## 2022-06-02 DIAGNOSIS — Z8673 Personal history of transient ischemic attack (TIA), and cerebral infarction without residual deficits: Secondary | ICD-10-CM | POA: Diagnosis not present

## 2022-06-02 DIAGNOSIS — D696 Thrombocytopenia, unspecified: Secondary | ICD-10-CM | POA: Diagnosis not present

## 2022-06-02 DIAGNOSIS — N400 Enlarged prostate without lower urinary tract symptoms: Secondary | ICD-10-CM | POA: Diagnosis not present

## 2022-06-02 DIAGNOSIS — G629 Polyneuropathy, unspecified: Secondary | ICD-10-CM | POA: Diagnosis not present

## 2022-06-02 DIAGNOSIS — F329 Major depressive disorder, single episode, unspecified: Secondary | ICD-10-CM | POA: Diagnosis not present

## 2022-06-02 DIAGNOSIS — F419 Anxiety disorder, unspecified: Secondary | ICD-10-CM | POA: Diagnosis not present

## 2022-06-02 DIAGNOSIS — E782 Mixed hyperlipidemia: Secondary | ICD-10-CM | POA: Diagnosis not present

## 2022-06-02 DIAGNOSIS — J309 Allergic rhinitis, unspecified: Secondary | ICD-10-CM | POA: Diagnosis not present

## 2022-06-07 DIAGNOSIS — M5416 Radiculopathy, lumbar region: Secondary | ICD-10-CM | POA: Diagnosis not present

## 2022-06-07 DIAGNOSIS — G40209 Localization-related (focal) (partial) symptomatic epilepsy and epileptic syndromes with complex partial seizures, not intractable, without status epilepticus: Secondary | ICD-10-CM | POA: Diagnosis not present

## 2022-06-07 DIAGNOSIS — M549 Dorsalgia, unspecified: Secondary | ICD-10-CM | POA: Diagnosis not present

## 2022-07-04 ENCOUNTER — Ambulatory Visit (INDEPENDENT_AMBULATORY_CARE_PROVIDER_SITE_OTHER): Payer: Medicare Other

## 2022-07-04 DIAGNOSIS — I495 Sick sinus syndrome: Secondary | ICD-10-CM

## 2022-07-05 LAB — CUP PACEART REMOTE DEVICE CHECK
Battery Remaining Longevity: 53 mo
Battery Remaining Percentage: 45 %
Battery Voltage: 2.98 V
Brady Statistic AP VP Percent: 1 %
Brady Statistic AP VS Percent: 99 %
Brady Statistic AS VP Percent: 1 %
Brady Statistic AS VS Percent: 1 %
Brady Statistic RA Percent Paced: 99 %
Brady Statistic RV Percent Paced: 1 %
Date Time Interrogation Session: 20240415050657
Implantable Lead Connection Status: 753985
Implantable Lead Connection Status: 753985
Implantable Lead Implant Date: 20180410
Implantable Lead Implant Date: 20180410
Implantable Lead Location: 753859
Implantable Lead Location: 753860
Implantable Pulse Generator Implant Date: 20180410
Lead Channel Impedance Value: 450 Ohm
Lead Channel Impedance Value: 510 Ohm
Lead Channel Pacing Threshold Amplitude: 0.625 V
Lead Channel Pacing Threshold Amplitude: 0.75 V
Lead Channel Pacing Threshold Pulse Width: 0.5 ms
Lead Channel Pacing Threshold Pulse Width: 0.5 ms
Lead Channel Sensing Intrinsic Amplitude: 1.9 mV
Lead Channel Sensing Intrinsic Amplitude: 6.3 mV
Lead Channel Setting Pacing Amplitude: 0.875
Lead Channel Setting Pacing Amplitude: 2 V
Lead Channel Setting Pacing Pulse Width: 0.5 ms
Lead Channel Setting Sensing Sensitivity: 2 mV
Pulse Gen Model: 2272
Pulse Gen Serial Number: 8016790

## 2022-08-08 NOTE — Progress Notes (Signed)
Remote pacemaker transmission.   

## 2022-09-06 DIAGNOSIS — G40209 Localization-related (focal) (partial) symptomatic epilepsy and epileptic syndromes with complex partial seizures, not intractable, without status epilepticus: Secondary | ICD-10-CM | POA: Diagnosis not present

## 2022-09-06 DIAGNOSIS — M5416 Radiculopathy, lumbar region: Secondary | ICD-10-CM | POA: Diagnosis not present

## 2022-09-06 DIAGNOSIS — G250 Essential tremor: Secondary | ICD-10-CM | POA: Diagnosis not present

## 2022-09-06 DIAGNOSIS — M549 Dorsalgia, unspecified: Secondary | ICD-10-CM | POA: Diagnosis not present

## 2022-10-03 ENCOUNTER — Ambulatory Visit: Payer: Medicare Other

## 2022-10-03 DIAGNOSIS — I495 Sick sinus syndrome: Secondary | ICD-10-CM | POA: Diagnosis not present

## 2022-10-04 LAB — CUP PACEART REMOTE DEVICE CHECK
Battery Remaining Longevity: 50 mo
Battery Remaining Percentage: 43 %
Battery Voltage: 2.98 V
Brady Statistic AP VP Percent: 1 %
Brady Statistic AP VS Percent: 99 %
Brady Statistic AS VP Percent: 1 %
Brady Statistic AS VS Percent: 1 %
Brady Statistic RA Percent Paced: 99 %
Brady Statistic RV Percent Paced: 1 %
Date Time Interrogation Session: 20240715054634
Implantable Lead Connection Status: 753985
Implantable Lead Connection Status: 753985
Implantable Lead Implant Date: 20180410
Implantable Lead Implant Date: 20180410
Implantable Lead Location: 753859
Implantable Lead Location: 753860
Implantable Pulse Generator Implant Date: 20180410
Lead Channel Impedance Value: 450 Ohm
Lead Channel Impedance Value: 530 Ohm
Lead Channel Pacing Threshold Amplitude: 0.75 V
Lead Channel Pacing Threshold Amplitude: 0.75 V
Lead Channel Pacing Threshold Pulse Width: 0.5 ms
Lead Channel Pacing Threshold Pulse Width: 0.5 ms
Lead Channel Sensing Intrinsic Amplitude: 3.1 mV
Lead Channel Sensing Intrinsic Amplitude: 6.5 mV
Lead Channel Setting Pacing Amplitude: 1 V
Lead Channel Setting Pacing Amplitude: 2 V
Lead Channel Setting Pacing Pulse Width: 0.5 ms
Lead Channel Setting Sensing Sensitivity: 2 mV
Pulse Gen Model: 2272
Pulse Gen Serial Number: 8016790

## 2022-10-17 NOTE — Progress Notes (Signed)
Remote pacemaker transmission.   

## 2022-12-08 DIAGNOSIS — Z23 Encounter for immunization: Secondary | ICD-10-CM | POA: Diagnosis not present

## 2023-01-02 ENCOUNTER — Ambulatory Visit (INDEPENDENT_AMBULATORY_CARE_PROVIDER_SITE_OTHER): Payer: Medicare Other

## 2023-01-02 DIAGNOSIS — I495 Sick sinus syndrome: Secondary | ICD-10-CM

## 2023-01-04 LAB — CUP PACEART REMOTE DEVICE CHECK
Battery Remaining Longevity: 47 mo
Battery Remaining Percentage: 40 %
Battery Voltage: 2.98 V
Brady Statistic AP VP Percent: 1 %
Brady Statistic AP VS Percent: 99 %
Brady Statistic AS VP Percent: 1 %
Brady Statistic AS VS Percent: 1 %
Brady Statistic RA Percent Paced: 99 %
Brady Statistic RV Percent Paced: 1 %
Date Time Interrogation Session: 20241014042702
Implantable Lead Connection Status: 753985
Implantable Lead Connection Status: 753985
Implantable Lead Implant Date: 20180410
Implantable Lead Implant Date: 20180410
Implantable Lead Location: 753859
Implantable Lead Location: 753860
Implantable Pulse Generator Implant Date: 20180410
Lead Channel Impedance Value: 450 Ohm
Lead Channel Impedance Value: 530 Ohm
Lead Channel Pacing Threshold Amplitude: 0.625 V
Lead Channel Pacing Threshold Amplitude: 0.75 V
Lead Channel Pacing Threshold Pulse Width: 0.5 ms
Lead Channel Pacing Threshold Pulse Width: 0.5 ms
Lead Channel Sensing Intrinsic Amplitude: 0.6 mV
Lead Channel Sensing Intrinsic Amplitude: 6.5 mV
Lead Channel Setting Pacing Amplitude: 0.875
Lead Channel Setting Pacing Amplitude: 2 V
Lead Channel Setting Pacing Pulse Width: 0.5 ms
Lead Channel Setting Sensing Sensitivity: 2 mV
Pulse Gen Model: 2272
Pulse Gen Serial Number: 8016790

## 2023-01-17 NOTE — Progress Notes (Signed)
Remote pacemaker transmission.   

## 2023-01-20 DIAGNOSIS — F339 Major depressive disorder, recurrent, unspecified: Secondary | ICD-10-CM | POA: Diagnosis not present

## 2023-01-20 DIAGNOSIS — G40909 Epilepsy, unspecified, not intractable, without status epilepticus: Secondary | ICD-10-CM | POA: Diagnosis not present

## 2023-01-20 DIAGNOSIS — R600 Localized edema: Secondary | ICD-10-CM | POA: Diagnosis not present

## 2023-01-20 DIAGNOSIS — Z0000 Encounter for general adult medical examination without abnormal findings: Secondary | ICD-10-CM | POA: Diagnosis not present

## 2023-01-20 DIAGNOSIS — D696 Thrombocytopenia, unspecified: Secondary | ICD-10-CM | POA: Diagnosis not present

## 2023-01-20 DIAGNOSIS — E782 Mixed hyperlipidemia: Secondary | ICD-10-CM | POA: Diagnosis not present

## 2023-01-20 DIAGNOSIS — E559 Vitamin D deficiency, unspecified: Secondary | ICD-10-CM | POA: Diagnosis not present

## 2023-01-20 DIAGNOSIS — Z23 Encounter for immunization: Secondary | ICD-10-CM | POA: Diagnosis not present

## 2023-01-20 DIAGNOSIS — I25119 Atherosclerotic heart disease of native coronary artery with unspecified angina pectoris: Secondary | ICD-10-CM | POA: Diagnosis not present

## 2023-01-20 DIAGNOSIS — N400 Enlarged prostate without lower urinary tract symptoms: Secondary | ICD-10-CM | POA: Diagnosis not present

## 2023-01-20 DIAGNOSIS — I10 Essential (primary) hypertension: Secondary | ICD-10-CM | POA: Diagnosis not present

## 2023-01-20 DIAGNOSIS — G629 Polyneuropathy, unspecified: Secondary | ICD-10-CM | POA: Diagnosis not present

## 2023-01-26 ENCOUNTER — Ambulatory Visit
Admission: RE | Admit: 2023-01-26 | Discharge: 2023-01-26 | Disposition: A | Discharge status: Home / Self Care | Payer: Medicare Other | Source: Ambulatory Visit | Attending: Family Medicine | Admitting: Family Medicine

## 2023-01-26 ENCOUNTER — Other Ambulatory Visit: Payer: Self-pay | Admitting: Family Medicine

## 2023-01-26 DIAGNOSIS — I10 Essential (primary) hypertension: Secondary | ICD-10-CM | POA: Diagnosis not present

## 2023-01-26 DIAGNOSIS — J069 Acute upper respiratory infection, unspecified: Secondary | ICD-10-CM

## 2023-01-26 DIAGNOSIS — I517 Cardiomegaly: Secondary | ICD-10-CM | POA: Diagnosis not present

## 2023-03-07 DIAGNOSIS — M5416 Radiculopathy, lumbar region: Secondary | ICD-10-CM | POA: Diagnosis not present

## 2023-03-07 DIAGNOSIS — G40209 Localization-related (focal) (partial) symptomatic epilepsy and epileptic syndromes with complex partial seizures, not intractable, without status epilepticus: Secondary | ICD-10-CM | POA: Diagnosis not present

## 2023-03-07 DIAGNOSIS — G250 Essential tremor: Secondary | ICD-10-CM | POA: Diagnosis not present

## 2023-03-07 DIAGNOSIS — M549 Dorsalgia, unspecified: Secondary | ICD-10-CM | POA: Diagnosis not present

## 2023-04-03 ENCOUNTER — Ambulatory Visit (INDEPENDENT_AMBULATORY_CARE_PROVIDER_SITE_OTHER): Payer: Medicare Other

## 2023-04-03 DIAGNOSIS — I495 Sick sinus syndrome: Secondary | ICD-10-CM | POA: Diagnosis not present

## 2023-04-03 LAB — CUP PACEART REMOTE DEVICE CHECK
Battery Remaining Longevity: 44 mo
Battery Remaining Percentage: 38 %
Battery Voltage: 2.96 V
Brady Statistic AP VP Percent: 1 %
Brady Statistic AP VS Percent: 99 %
Brady Statistic AS VP Percent: 1 %
Brady Statistic AS VS Percent: 1 %
Brady Statistic RA Percent Paced: 99 %
Brady Statistic RV Percent Paced: 1 %
Date Time Interrogation Session: 20250113020015
Implantable Lead Connection Status: 753985
Implantable Lead Connection Status: 753985
Implantable Lead Implant Date: 20180410
Implantable Lead Implant Date: 20180410
Implantable Lead Location: 753859
Implantable Lead Location: 753860
Implantable Pulse Generator Implant Date: 20180410
Lead Channel Impedance Value: 450 Ohm
Lead Channel Impedance Value: 540 Ohm
Lead Channel Pacing Threshold Amplitude: 0.625 V
Lead Channel Pacing Threshold Amplitude: 0.75 V
Lead Channel Pacing Threshold Pulse Width: 0.5 ms
Lead Channel Pacing Threshold Pulse Width: 0.5 ms
Lead Channel Sensing Intrinsic Amplitude: 1.8 mV
Lead Channel Sensing Intrinsic Amplitude: 8.4 mV
Lead Channel Setting Pacing Amplitude: 0.875
Lead Channel Setting Pacing Amplitude: 2 V
Lead Channel Setting Pacing Pulse Width: 0.5 ms
Lead Channel Setting Sensing Sensitivity: 2 mV
Pulse Gen Model: 2272
Pulse Gen Serial Number: 8016790

## 2023-04-25 DIAGNOSIS — L538 Other specified erythematous conditions: Secondary | ICD-10-CM | POA: Diagnosis not present

## 2023-04-25 DIAGNOSIS — L814 Other melanin hyperpigmentation: Secondary | ICD-10-CM | POA: Diagnosis not present

## 2023-04-25 DIAGNOSIS — D225 Melanocytic nevi of trunk: Secondary | ICD-10-CM | POA: Diagnosis not present

## 2023-04-25 DIAGNOSIS — L820 Inflamed seborrheic keratosis: Secondary | ICD-10-CM | POA: Diagnosis not present

## 2023-04-25 DIAGNOSIS — D485 Neoplasm of uncertain behavior of skin: Secondary | ICD-10-CM | POA: Diagnosis not present

## 2023-04-25 DIAGNOSIS — L821 Other seborrheic keratosis: Secondary | ICD-10-CM | POA: Diagnosis not present

## 2023-05-04 ENCOUNTER — Ambulatory Visit: Payer: Medicare Other | Attending: Cardiology | Admitting: Cardiology

## 2023-05-04 ENCOUNTER — Encounter: Payer: Self-pay | Admitting: Cardiology

## 2023-05-04 VITALS — BP 116/70 | HR 60 | Ht 64.0 in | Wt 180.0 lb

## 2023-05-04 DIAGNOSIS — I455 Other specified heart block: Secondary | ICD-10-CM | POA: Insufficient documentation

## 2023-05-04 DIAGNOSIS — D6869 Other thrombophilia: Secondary | ICD-10-CM | POA: Diagnosis not present

## 2023-05-04 DIAGNOSIS — I495 Sick sinus syndrome: Secondary | ICD-10-CM | POA: Insufficient documentation

## 2023-05-04 DIAGNOSIS — I10 Essential (primary) hypertension: Secondary | ICD-10-CM | POA: Insufficient documentation

## 2023-05-04 DIAGNOSIS — I2510 Atherosclerotic heart disease of native coronary artery without angina pectoris: Secondary | ICD-10-CM | POA: Diagnosis not present

## 2023-05-04 NOTE — Progress Notes (Signed)
  Electrophysiology Office Note:   Date:  05/04/2023  ID:  Aaron Keller, DOB [birth year], MRN [id number]  Primary Cardiologist: Kristeen Miss, MD Primary Heart Failure: None Electrophysiologist: Ege Muckey Jorja Loa, MD      History of Present Illness:   Aaron Keller is a 78 y.o. male with h/o coronary artery disease, sick sinus syndrome, hypertension, hyperlipidemia, CVA seen today for routine electrophysiology followup.   Since last being seen in our clinic the patient reports doing overall well.  He has no chest pain or shortness of breath.  He continues to be able to do all of his daily activities.  He has no acute complaints at this time.  he denies chest pain, palpitations, dyspnea, PND, orthopnea, nausea, vomiting, dizziness, syncope, edema, weight gain, or early satiety.   Review of systems complete and found to be negative unless listed in HPI.      EP Information / Studies Reviewed:    EKG is ordered today. Personal review as below.  EKG Interpretation Date/Time:  Thursday May 04 2023 16:18:19 EST Ventricular Rate:  60 PR Interval:  242 QRS Duration:  110 QT Interval:  420 QTC Calculation: 420 R Axis:   -44  Text Interpretation: Atrial-paced rhythm with prolonged AV conduction Left axis deviation Minimal voltage criteria for LVH, may be normal variant ( Cornell product ) When compared with ECG of 29-Jun-2016 06:26, Electronic atrial pacemaker has replaced Sinus rhythm Confirmed by Tashari Schoenfelder ([id number]) on 05/04/2023 4:19:49 PM   PPM Interrogation-  reviewed in detail today,  See PACEART report.  Device History: Abbott Dual Chamber PPM implanted for Sinus Node Dysfunction  Risk Assessment/Calculations:             Physical Exam:   VS:  BP 116/70 (BP Location: Left Arm, Patient Position: Sitting, Cuff Size: Large)   Pulse 60   Ht 5\' 4"  (1.626 m)   Wt 180 lb (81.6 kg)   SpO2 97%   BMI 30.90 kg/m    Wt Readings from Last 3 Encounters:  05/04/23 180  lb (81.6 kg)  05/06/22 185 lb 6.4 oz (84.1 kg)  05/25/21 184 lb 3.2 oz (83.6 kg)     GEN: Well nourished, well developed in no acute distress NECK: No JVD; No carotid bruits CARDIAC: Regular rate and rhythm, no murmurs, rubs, gallops RESPIRATORY:  Clear to auscultation without rales, wheezing or rhonchi  ABDOMEN: Soft, non-tender, non-distended EXTREMITIES:  No edema; No deformity   ASSESSMENT AND PLAN:    SND s/p Abbott PPM  Normal PPM function See Pace Art report No changes today  2.  Coronary artery disease: No current chest pain.  Plan per primary cardiology.  3.  Hypertension: Currently well-controlled  4 hyperlipidemia: Goal LDL less than 70.  Plan per primary cardiology  Disposition:   Follow up with EP APP in 12 months  Signed, Mihika Surrette Jorja Loa, MD

## 2023-05-15 NOTE — Progress Notes (Signed)
 Remote pacemaker transmission.

## 2023-05-30 DIAGNOSIS — I25119 Atherosclerotic heart disease of native coronary artery with unspecified angina pectoris: Secondary | ICD-10-CM | POA: Diagnosis not present

## 2023-05-30 DIAGNOSIS — I10 Essential (primary) hypertension: Secondary | ICD-10-CM | POA: Diagnosis not present

## 2023-06-12 DIAGNOSIS — M25512 Pain in left shoulder: Secondary | ICD-10-CM | POA: Diagnosis not present

## 2023-06-19 DIAGNOSIS — F329 Major depressive disorder, single episode, unspecified: Secondary | ICD-10-CM | POA: Diagnosis not present

## 2023-06-19 DIAGNOSIS — N400 Enlarged prostate without lower urinary tract symptoms: Secondary | ICD-10-CM | POA: Diagnosis not present

## 2023-06-19 DIAGNOSIS — I10 Essential (primary) hypertension: Secondary | ICD-10-CM | POA: Diagnosis not present

## 2023-06-19 DIAGNOSIS — I25119 Atherosclerotic heart disease of native coronary artery with unspecified angina pectoris: Secondary | ICD-10-CM | POA: Diagnosis not present

## 2023-06-19 DIAGNOSIS — E782 Mixed hyperlipidemia: Secondary | ICD-10-CM | POA: Diagnosis not present

## 2023-06-26 DIAGNOSIS — M25612 Stiffness of left shoulder, not elsewhere classified: Secondary | ICD-10-CM | POA: Diagnosis not present

## 2023-06-26 DIAGNOSIS — M7542 Impingement syndrome of left shoulder: Secondary | ICD-10-CM | POA: Diagnosis not present

## 2023-06-26 DIAGNOSIS — M25512 Pain in left shoulder: Secondary | ICD-10-CM | POA: Diagnosis not present

## 2023-07-02 LAB — CUP PACEART REMOTE DEVICE CHECK
Battery Remaining Longevity: 41 mo
Battery Remaining Percentage: 35 %
Battery Voltage: 2.96 V
Brady Statistic AP VP Percent: 1 %
Brady Statistic AP VS Percent: 99 %
Brady Statistic AS VP Percent: 1 %
Brady Statistic AS VS Percent: 1 %
Brady Statistic RA Percent Paced: 99 %
Brady Statistic RV Percent Paced: 1 %
Date Time Interrogation Session: 20250412054921
Implantable Lead Connection Status: 753985
Implantable Lead Connection Status: 753985
Implantable Lead Implant Date: 20180410
Implantable Lead Implant Date: 20180410
Implantable Lead Location: 753859
Implantable Lead Location: 753860
Implantable Pulse Generator Implant Date: 20180410
Lead Channel Impedance Value: 450 Ohm
Lead Channel Impedance Value: 530 Ohm
Lead Channel Pacing Threshold Amplitude: 0.625 V
Lead Channel Pacing Threshold Amplitude: 0.75 V
Lead Channel Pacing Threshold Pulse Width: 0.5 ms
Lead Channel Pacing Threshold Pulse Width: 0.5 ms
Lead Channel Sensing Intrinsic Amplitude: 0.7 mV
Lead Channel Sensing Intrinsic Amplitude: 9.1 mV
Lead Channel Setting Pacing Amplitude: 0.875
Lead Channel Setting Pacing Amplitude: 2 V
Lead Channel Setting Pacing Pulse Width: 0.5 ms
Lead Channel Setting Sensing Sensitivity: 2 mV
Pulse Gen Model: 2272
Pulse Gen Serial Number: 8016790

## 2023-07-03 ENCOUNTER — Ambulatory Visit (INDEPENDENT_AMBULATORY_CARE_PROVIDER_SITE_OTHER): Payer: Medicare Other

## 2023-07-03 DIAGNOSIS — I10 Essential (primary) hypertension: Secondary | ICD-10-CM | POA: Diagnosis not present

## 2023-07-03 DIAGNOSIS — I495 Sick sinus syndrome: Secondary | ICD-10-CM

## 2023-07-03 DIAGNOSIS — I25119 Atherosclerotic heart disease of native coronary artery with unspecified angina pectoris: Secondary | ICD-10-CM | POA: Diagnosis not present

## 2023-07-04 ENCOUNTER — Encounter: Payer: Self-pay | Admitting: Cardiology

## 2023-07-04 DIAGNOSIS — M25512 Pain in left shoulder: Secondary | ICD-10-CM | POA: Diagnosis not present

## 2023-07-04 DIAGNOSIS — M7542 Impingement syndrome of left shoulder: Secondary | ICD-10-CM | POA: Diagnosis not present

## 2023-07-04 DIAGNOSIS — M25612 Stiffness of left shoulder, not elsewhere classified: Secondary | ICD-10-CM | POA: Diagnosis not present

## 2023-07-14 DIAGNOSIS — M25512 Pain in left shoulder: Secondary | ICD-10-CM | POA: Diagnosis not present

## 2023-07-14 DIAGNOSIS — M25612 Stiffness of left shoulder, not elsewhere classified: Secondary | ICD-10-CM | POA: Diagnosis not present

## 2023-07-14 DIAGNOSIS — M7542 Impingement syndrome of left shoulder: Secondary | ICD-10-CM | POA: Diagnosis not present

## 2023-07-19 DIAGNOSIS — N400 Enlarged prostate without lower urinary tract symptoms: Secondary | ICD-10-CM | POA: Diagnosis not present

## 2023-07-19 DIAGNOSIS — I10 Essential (primary) hypertension: Secondary | ICD-10-CM | POA: Diagnosis not present

## 2023-07-19 DIAGNOSIS — F329 Major depressive disorder, single episode, unspecified: Secondary | ICD-10-CM | POA: Diagnosis not present

## 2023-07-19 DIAGNOSIS — I25119 Atherosclerotic heart disease of native coronary artery with unspecified angina pectoris: Secondary | ICD-10-CM | POA: Diagnosis not present

## 2023-07-19 DIAGNOSIS — E782 Mixed hyperlipidemia: Secondary | ICD-10-CM | POA: Diagnosis not present

## 2023-07-24 DIAGNOSIS — M25512 Pain in left shoulder: Secondary | ICD-10-CM | POA: Diagnosis not present

## 2023-08-01 DIAGNOSIS — F339 Major depressive disorder, recurrent, unspecified: Secondary | ICD-10-CM | POA: Diagnosis not present

## 2023-08-01 DIAGNOSIS — J309 Allergic rhinitis, unspecified: Secondary | ICD-10-CM | POA: Diagnosis not present

## 2023-08-01 DIAGNOSIS — G629 Polyneuropathy, unspecified: Secondary | ICD-10-CM | POA: Diagnosis not present

## 2023-08-01 DIAGNOSIS — E782 Mixed hyperlipidemia: Secondary | ICD-10-CM | POA: Diagnosis not present

## 2023-08-01 DIAGNOSIS — I10 Essential (primary) hypertension: Secondary | ICD-10-CM | POA: Diagnosis not present

## 2023-08-01 DIAGNOSIS — D696 Thrombocytopenia, unspecified: Secondary | ICD-10-CM | POA: Diagnosis not present

## 2023-08-01 DIAGNOSIS — N400 Enlarged prostate without lower urinary tract symptoms: Secondary | ICD-10-CM | POA: Diagnosis not present

## 2023-08-01 DIAGNOSIS — G40909 Epilepsy, unspecified, not intractable, without status epilepticus: Secondary | ICD-10-CM | POA: Diagnosis not present

## 2023-08-01 DIAGNOSIS — F419 Anxiety disorder, unspecified: Secondary | ICD-10-CM | POA: Diagnosis not present

## 2023-08-01 DIAGNOSIS — M545 Low back pain, unspecified: Secondary | ICD-10-CM | POA: Diagnosis not present

## 2023-08-01 DIAGNOSIS — E559 Vitamin D deficiency, unspecified: Secondary | ICD-10-CM | POA: Diagnosis not present

## 2023-08-01 DIAGNOSIS — I25119 Atherosclerotic heart disease of native coronary artery with unspecified angina pectoris: Secondary | ICD-10-CM | POA: Diagnosis not present

## 2023-08-08 DIAGNOSIS — I10 Essential (primary) hypertension: Secondary | ICD-10-CM | POA: Diagnosis not present

## 2023-08-08 DIAGNOSIS — I25119 Atherosclerotic heart disease of native coronary artery with unspecified angina pectoris: Secondary | ICD-10-CM | POA: Diagnosis not present

## 2023-08-19 DIAGNOSIS — E782 Mixed hyperlipidemia: Secondary | ICD-10-CM | POA: Diagnosis not present

## 2023-08-19 DIAGNOSIS — N400 Enlarged prostate without lower urinary tract symptoms: Secondary | ICD-10-CM | POA: Diagnosis not present

## 2023-08-19 DIAGNOSIS — F329 Major depressive disorder, single episode, unspecified: Secondary | ICD-10-CM | POA: Diagnosis not present

## 2023-08-19 DIAGNOSIS — I10 Essential (primary) hypertension: Secondary | ICD-10-CM | POA: Diagnosis not present

## 2023-08-19 DIAGNOSIS — I25119 Atherosclerotic heart disease of native coronary artery with unspecified angina pectoris: Secondary | ICD-10-CM | POA: Diagnosis not present

## 2023-08-21 NOTE — Progress Notes (Signed)
 Remote pacemaker transmission.

## 2023-08-27 DIAGNOSIS — S80211A Abrasion, right knee, initial encounter: Secondary | ICD-10-CM | POA: Diagnosis not present

## 2023-08-27 DIAGNOSIS — S0093XA Contusion of unspecified part of head, initial encounter: Secondary | ICD-10-CM | POA: Diagnosis not present

## 2023-08-27 DIAGNOSIS — Z950 Presence of cardiac pacemaker: Secondary | ICD-10-CM | POA: Diagnosis not present

## 2023-08-27 DIAGNOSIS — I10 Essential (primary) hypertension: Secondary | ICD-10-CM | POA: Diagnosis not present

## 2023-08-27 DIAGNOSIS — S0081XA Abrasion of other part of head, initial encounter: Secondary | ICD-10-CM | POA: Diagnosis not present

## 2023-08-27 DIAGNOSIS — S60511A Abrasion of right hand, initial encounter: Secondary | ICD-10-CM | POA: Diagnosis not present

## 2023-08-27 DIAGNOSIS — Z8673 Personal history of transient ischemic attack (TIA), and cerebral infarction without residual deficits: Secondary | ICD-10-CM | POA: Diagnosis not present

## 2023-08-27 DIAGNOSIS — S50311A Abrasion of right elbow, initial encounter: Secondary | ICD-10-CM | POA: Diagnosis not present

## 2023-08-27 DIAGNOSIS — S0990XA Unspecified injury of head, initial encounter: Secondary | ICD-10-CM | POA: Diagnosis not present

## 2023-08-27 DIAGNOSIS — W19XXXA Unspecified fall, initial encounter: Secondary | ICD-10-CM | POA: Diagnosis not present

## 2023-09-11 DIAGNOSIS — I10 Essential (primary) hypertension: Secondary | ICD-10-CM | POA: Diagnosis not present

## 2023-09-11 DIAGNOSIS — I25119 Atherosclerotic heart disease of native coronary artery with unspecified angina pectoris: Secondary | ICD-10-CM | POA: Diagnosis not present

## 2023-09-18 DIAGNOSIS — F329 Major depressive disorder, single episode, unspecified: Secondary | ICD-10-CM | POA: Diagnosis not present

## 2023-09-18 DIAGNOSIS — N400 Enlarged prostate without lower urinary tract symptoms: Secondary | ICD-10-CM | POA: Diagnosis not present

## 2023-09-18 DIAGNOSIS — I10 Essential (primary) hypertension: Secondary | ICD-10-CM | POA: Diagnosis not present

## 2023-09-18 DIAGNOSIS — I25119 Atherosclerotic heart disease of native coronary artery with unspecified angina pectoris: Secondary | ICD-10-CM | POA: Diagnosis not present

## 2023-09-18 DIAGNOSIS — E782 Mixed hyperlipidemia: Secondary | ICD-10-CM | POA: Diagnosis not present

## 2023-10-02 ENCOUNTER — Ambulatory Visit: Payer: Medicare Other

## 2023-10-02 ENCOUNTER — Ambulatory Visit: Payer: Self-pay | Admitting: Cardiology

## 2023-10-02 DIAGNOSIS — I495 Sick sinus syndrome: Secondary | ICD-10-CM | POA: Diagnosis not present

## 2023-10-02 LAB — CUP PACEART REMOTE DEVICE CHECK
Battery Remaining Longevity: 38 mo
Battery Remaining Percentage: 32 %
Battery Voltage: 2.95 V
Brady Statistic AP VP Percent: 1 %
Brady Statistic AP VS Percent: 99 %
Brady Statistic AS VP Percent: 1 %
Brady Statistic AS VS Percent: 1 %
Brady Statistic RA Percent Paced: 99 %
Brady Statistic RV Percent Paced: 1 %
Date Time Interrogation Session: 20250714022829
Implantable Lead Connection Status: 753985
Implantable Lead Connection Status: 753985
Implantable Lead Implant Date: 20180410
Implantable Lead Implant Date: 20180410
Implantable Lead Location: 753859
Implantable Lead Location: 753860
Implantable Pulse Generator Implant Date: 20180410
Lead Channel Impedance Value: 450 Ohm
Lead Channel Impedance Value: 510 Ohm
Lead Channel Pacing Threshold Amplitude: 0.625 V
Lead Channel Pacing Threshold Amplitude: 0.75 V
Lead Channel Pacing Threshold Pulse Width: 0.5 ms
Lead Channel Pacing Threshold Pulse Width: 0.5 ms
Lead Channel Sensing Intrinsic Amplitude: 1.5 mV
Lead Channel Sensing Intrinsic Amplitude: 6.1 mV
Lead Channel Setting Pacing Amplitude: 0.875
Lead Channel Setting Pacing Amplitude: 2 V
Lead Channel Setting Pacing Pulse Width: 0.5 ms
Lead Channel Setting Sensing Sensitivity: 2 mV
Pulse Gen Model: 2272
Pulse Gen Serial Number: 8016790

## 2023-10-19 DIAGNOSIS — I10 Essential (primary) hypertension: Secondary | ICD-10-CM | POA: Diagnosis not present

## 2023-10-19 DIAGNOSIS — E782 Mixed hyperlipidemia: Secondary | ICD-10-CM | POA: Diagnosis not present

## 2023-10-19 DIAGNOSIS — I25119 Atherosclerotic heart disease of native coronary artery with unspecified angina pectoris: Secondary | ICD-10-CM | POA: Diagnosis not present

## 2023-10-19 DIAGNOSIS — N400 Enlarged prostate without lower urinary tract symptoms: Secondary | ICD-10-CM | POA: Diagnosis not present

## 2023-10-19 DIAGNOSIS — F329 Major depressive disorder, single episode, unspecified: Secondary | ICD-10-CM | POA: Diagnosis not present

## 2023-10-31 DIAGNOSIS — I10 Essential (primary) hypertension: Secondary | ICD-10-CM | POA: Diagnosis not present

## 2023-10-31 DIAGNOSIS — I25119 Atherosclerotic heart disease of native coronary artery with unspecified angina pectoris: Secondary | ICD-10-CM | POA: Diagnosis not present

## 2023-11-19 DIAGNOSIS — F329 Major depressive disorder, single episode, unspecified: Secondary | ICD-10-CM | POA: Diagnosis not present

## 2023-11-19 DIAGNOSIS — N400 Enlarged prostate without lower urinary tract symptoms: Secondary | ICD-10-CM | POA: Diagnosis not present

## 2023-11-19 DIAGNOSIS — I10 Essential (primary) hypertension: Secondary | ICD-10-CM | POA: Diagnosis not present

## 2023-11-19 DIAGNOSIS — E782 Mixed hyperlipidemia: Secondary | ICD-10-CM | POA: Diagnosis not present

## 2023-11-19 DIAGNOSIS — I25119 Atherosclerotic heart disease of native coronary artery with unspecified angina pectoris: Secondary | ICD-10-CM | POA: Diagnosis not present

## 2023-12-07 DIAGNOSIS — I25119 Atherosclerotic heart disease of native coronary artery with unspecified angina pectoris: Secondary | ICD-10-CM | POA: Diagnosis not present

## 2023-12-07 DIAGNOSIS — I10 Essential (primary) hypertension: Secondary | ICD-10-CM | POA: Diagnosis not present

## 2023-12-19 DIAGNOSIS — N400 Enlarged prostate without lower urinary tract symptoms: Secondary | ICD-10-CM | POA: Diagnosis not present

## 2023-12-19 DIAGNOSIS — I25119 Atherosclerotic heart disease of native coronary artery with unspecified angina pectoris: Secondary | ICD-10-CM | POA: Diagnosis not present

## 2023-12-19 DIAGNOSIS — I10 Essential (primary) hypertension: Secondary | ICD-10-CM | POA: Diagnosis not present

## 2023-12-19 DIAGNOSIS — E782 Mixed hyperlipidemia: Secondary | ICD-10-CM | POA: Diagnosis not present

## 2023-12-19 DIAGNOSIS — F329 Major depressive disorder, single episode, unspecified: Secondary | ICD-10-CM | POA: Diagnosis not present

## 2023-12-28 NOTE — Progress Notes (Signed)
 Remote PPM Transmission

## 2024-01-01 ENCOUNTER — Ambulatory Visit: Payer: Self-pay | Admitting: Cardiology

## 2024-01-01 ENCOUNTER — Ambulatory Visit (INDEPENDENT_AMBULATORY_CARE_PROVIDER_SITE_OTHER): Payer: Medicare Other

## 2024-01-01 DIAGNOSIS — I495 Sick sinus syndrome: Secondary | ICD-10-CM

## 2024-01-01 LAB — CUP PACEART REMOTE DEVICE CHECK
Battery Remaining Longevity: 35 mo
Battery Remaining Percentage: 30 %
Battery Voltage: 2.95 V
Brady Statistic AP VP Percent: 1 %
Brady Statistic AP VS Percent: 99 %
Brady Statistic AS VP Percent: 1 %
Brady Statistic AS VS Percent: 1 %
Brady Statistic RA Percent Paced: 99 %
Brady Statistic RV Percent Paced: 1 %
Date Time Interrogation Session: 20251013031201
Implantable Lead Connection Status: 753985
Implantable Lead Connection Status: 753985
Implantable Lead Implant Date: 20180410
Implantable Lead Implant Date: 20180410
Implantable Lead Location: 753859
Implantable Lead Location: 753860
Implantable Pulse Generator Implant Date: 20180410
Lead Channel Impedance Value: 450 Ohm
Lead Channel Impedance Value: 510 Ohm
Lead Channel Pacing Threshold Amplitude: 0.625 V
Lead Channel Pacing Threshold Amplitude: 0.75 V
Lead Channel Pacing Threshold Pulse Width: 0.5 ms
Lead Channel Pacing Threshold Pulse Width: 0.5 ms
Lead Channel Sensing Intrinsic Amplitude: 1.6 mV
Lead Channel Sensing Intrinsic Amplitude: 6.8 mV
Lead Channel Setting Pacing Amplitude: 0.875
Lead Channel Setting Pacing Amplitude: 2 V
Lead Channel Setting Pacing Pulse Width: 0.5 ms
Lead Channel Setting Sensing Sensitivity: 2 mV
Pulse Gen Model: 2272
Pulse Gen Serial Number: 8016790

## 2024-01-02 NOTE — Progress Notes (Signed)
 Remote PPM Transmission

## 2024-01-04 DIAGNOSIS — Z23 Encounter for immunization: Secondary | ICD-10-CM | POA: Diagnosis not present

## 2024-01-06 DIAGNOSIS — I10 Essential (primary) hypertension: Secondary | ICD-10-CM | POA: Diagnosis not present

## 2024-01-06 DIAGNOSIS — I25119 Atherosclerotic heart disease of native coronary artery with unspecified angina pectoris: Secondary | ICD-10-CM | POA: Diagnosis not present

## 2024-01-19 DIAGNOSIS — N400 Enlarged prostate without lower urinary tract symptoms: Secondary | ICD-10-CM | POA: Diagnosis not present

## 2024-01-19 DIAGNOSIS — E782 Mixed hyperlipidemia: Secondary | ICD-10-CM | POA: Diagnosis not present

## 2024-01-19 DIAGNOSIS — F329 Major depressive disorder, single episode, unspecified: Secondary | ICD-10-CM | POA: Diagnosis not present

## 2024-01-19 DIAGNOSIS — I10 Essential (primary) hypertension: Secondary | ICD-10-CM | POA: Diagnosis not present

## 2024-01-19 DIAGNOSIS — I25119 Atherosclerotic heart disease of native coronary artery with unspecified angina pectoris: Secondary | ICD-10-CM | POA: Diagnosis not present

## 2024-02-05 DIAGNOSIS — I10 Essential (primary) hypertension: Secondary | ICD-10-CM | POA: Diagnosis not present

## 2024-02-05 DIAGNOSIS — I25119 Atherosclerotic heart disease of native coronary artery with unspecified angina pectoris: Secondary | ICD-10-CM | POA: Diagnosis not present

## 2024-02-14 DIAGNOSIS — E782 Mixed hyperlipidemia: Secondary | ICD-10-CM | POA: Diagnosis not present

## 2024-02-14 DIAGNOSIS — D696 Thrombocytopenia, unspecified: Secondary | ICD-10-CM | POA: Diagnosis not present

## 2024-02-14 DIAGNOSIS — E559 Vitamin D deficiency, unspecified: Secondary | ICD-10-CM | POA: Diagnosis not present

## 2024-02-18 DIAGNOSIS — N400 Enlarged prostate without lower urinary tract symptoms: Secondary | ICD-10-CM | POA: Diagnosis not present

## 2024-02-18 DIAGNOSIS — F329 Major depressive disorder, single episode, unspecified: Secondary | ICD-10-CM | POA: Diagnosis not present

## 2024-02-18 DIAGNOSIS — E782 Mixed hyperlipidemia: Secondary | ICD-10-CM | POA: Diagnosis not present

## 2024-02-18 DIAGNOSIS — I25119 Atherosclerotic heart disease of native coronary artery with unspecified angina pectoris: Secondary | ICD-10-CM | POA: Diagnosis not present

## 2024-02-18 DIAGNOSIS — I10 Essential (primary) hypertension: Secondary | ICD-10-CM | POA: Diagnosis not present

## 2024-02-21 DIAGNOSIS — R238 Other skin changes: Secondary | ICD-10-CM | POA: Diagnosis not present

## 2024-02-21 DIAGNOSIS — D485 Neoplasm of uncertain behavior of skin: Secondary | ICD-10-CM | POA: Diagnosis not present

## 2024-02-21 DIAGNOSIS — L538 Other specified erythematous conditions: Secondary | ICD-10-CM | POA: Diagnosis not present

## 2024-02-21 DIAGNOSIS — B078 Other viral warts: Secondary | ICD-10-CM | POA: Diagnosis not present

## 2024-02-22 DIAGNOSIS — E782 Mixed hyperlipidemia: Secondary | ICD-10-CM | POA: Diagnosis not present

## 2024-02-22 DIAGNOSIS — F339 Major depressive disorder, recurrent, unspecified: Secondary | ICD-10-CM | POA: Diagnosis not present

## 2024-02-22 DIAGNOSIS — Z8673 Personal history of transient ischemic attack (TIA), and cerebral infarction without residual deficits: Secondary | ICD-10-CM | POA: Diagnosis not present

## 2024-02-22 DIAGNOSIS — G629 Polyneuropathy, unspecified: Secondary | ICD-10-CM | POA: Diagnosis not present

## 2024-02-22 DIAGNOSIS — E559 Vitamin D deficiency, unspecified: Secondary | ICD-10-CM | POA: Diagnosis not present

## 2024-02-22 DIAGNOSIS — D696 Thrombocytopenia, unspecified: Secondary | ICD-10-CM | POA: Diagnosis not present

## 2024-02-22 DIAGNOSIS — Z0000 Encounter for general adult medical examination without abnormal findings: Secondary | ICD-10-CM | POA: Diagnosis not present

## 2024-02-22 DIAGNOSIS — I25119 Atherosclerotic heart disease of native coronary artery with unspecified angina pectoris: Secondary | ICD-10-CM | POA: Diagnosis not present

## 2024-02-22 DIAGNOSIS — I10 Essential (primary) hypertension: Secondary | ICD-10-CM | POA: Diagnosis not present

## 2024-02-22 DIAGNOSIS — G40909 Epilepsy, unspecified, not intractable, without status epilepticus: Secondary | ICD-10-CM | POA: Diagnosis not present

## 2024-02-22 DIAGNOSIS — N400 Enlarged prostate without lower urinary tract symptoms: Secondary | ICD-10-CM | POA: Diagnosis not present

## 2024-02-22 DIAGNOSIS — J309 Allergic rhinitis, unspecified: Secondary | ICD-10-CM | POA: Diagnosis not present

## 2024-04-01 ENCOUNTER — Ambulatory Visit: Payer: Medicare Other

## 2024-04-01 DIAGNOSIS — I495 Sick sinus syndrome: Secondary | ICD-10-CM | POA: Diagnosis not present

## 2024-04-02 ENCOUNTER — Ambulatory Visit: Payer: Self-pay | Admitting: Cardiology

## 2024-04-02 LAB — CUP PACEART REMOTE DEVICE CHECK
Battery Remaining Longevity: 32 mo
Battery Remaining Percentage: 27 %
Battery Voltage: 2.93 V
Brady Statistic AP VP Percent: 1 %
Brady Statistic AP VS Percent: 99 %
Brady Statistic AS VP Percent: 1 %
Brady Statistic AS VS Percent: 1 %
Brady Statistic RA Percent Paced: 99 %
Brady Statistic RV Percent Paced: 1 %
Date Time Interrogation Session: 20260112025042
Implantable Lead Connection Status: 753985
Implantable Lead Connection Status: 753985
Implantable Lead Implant Date: 20180410
Implantable Lead Implant Date: 20180410
Implantable Lead Location: 753859
Implantable Lead Location: 753860
Implantable Pulse Generator Implant Date: 20180410
Lead Channel Impedance Value: 450 Ohm
Lead Channel Impedance Value: 530 Ohm
Lead Channel Pacing Threshold Amplitude: 0.625 V
Lead Channel Pacing Threshold Amplitude: 0.75 V
Lead Channel Pacing Threshold Pulse Width: 0.5 ms
Lead Channel Pacing Threshold Pulse Width: 0.5 ms
Lead Channel Sensing Intrinsic Amplitude: 2.4 mV
Lead Channel Sensing Intrinsic Amplitude: 8.3 mV
Lead Channel Setting Pacing Amplitude: 0.875
Lead Channel Setting Pacing Amplitude: 2 V
Lead Channel Setting Pacing Pulse Width: 0.5 ms
Lead Channel Setting Sensing Sensitivity: 2 mV
Pulse Gen Model: 2272
Pulse Gen Serial Number: 8016790

## 2024-04-03 NOTE — Progress Notes (Signed)
 Remote PPM Transmission

## 2024-05-30 ENCOUNTER — Ambulatory Visit: Admitting: Student

## 2024-07-01 ENCOUNTER — Encounter

## 2024-09-30 ENCOUNTER — Encounter

## 2024-12-30 ENCOUNTER — Encounter

## 2025-03-31 ENCOUNTER — Encounter

## 2025-06-30 ENCOUNTER — Encounter

## 2025-09-29 ENCOUNTER — Encounter
